# Patient Record
Sex: Female | Born: 1965 | Race: Black or African American | Hispanic: No | Marital: Single | Smoking: Never smoker
Health system: Southern US, Community
[De-identification: ages and names within clinical notes are randomized; demographics above are authoritative.]

## PROBLEM LIST (undated history)

## (undated) DIAGNOSIS — R51 Headache: Secondary | ICD-10-CM

## (undated) DIAGNOSIS — Q430 Meckel's diverticulum (displaced) (hypertrophic): Secondary | ICD-10-CM

## (undated) DIAGNOSIS — M1990 Unspecified osteoarthritis, unspecified site: Secondary | ICD-10-CM

## (undated) DIAGNOSIS — T7840XA Allergy, unspecified, initial encounter: Secondary | ICD-10-CM

## (undated) DIAGNOSIS — D649 Anemia, unspecified: Secondary | ICD-10-CM

## (undated) DIAGNOSIS — I10 Essential (primary) hypertension: Secondary | ICD-10-CM

## (undated) DIAGNOSIS — M533 Sacrococcygeal disorders, not elsewhere classified: Principal | ICD-10-CM

## (undated) DIAGNOSIS — N736 Female pelvic peritoneal adhesions (postinfective): Secondary | ICD-10-CM

## (undated) HISTORY — DX: Female pelvic peritoneal adhesions (postinfective): N73.6

## (undated) HISTORY — DX: Headache: R51

## (undated) HISTORY — DX: Meckel's diverticulum (displaced) (hypertrophic): Q43.0

## (undated) HISTORY — DX: Anemia, unspecified: D64.9

## (undated) HISTORY — DX: Allergy, unspecified, initial encounter: T78.40XA

## (undated) HISTORY — PX: OTHER SURGICAL HISTORY: SHX169

## (undated) HISTORY — DX: Essential (primary) hypertension: I10

## (undated) HISTORY — DX: Sacrococcygeal disorders, not elsewhere classified: M53.3

## (undated) HISTORY — DX: Unspecified osteoarthritis, unspecified site: M19.90

---

## 2004-08-10 DIAGNOSIS — Q430 Meckel's diverticulum (displaced) (hypertrophic): Secondary | ICD-10-CM

## 2004-08-10 HISTORY — DX: Meckel's diverticulum (displaced) (hypertrophic): Q43.0

## 2004-08-14 ENCOUNTER — Inpatient Hospital Stay (HOSPITAL_COMMUNITY): Admission: AD | Admit: 2004-08-14 | Discharge: 2004-08-14 | Payer: Self-pay | Admitting: Obstetrics & Gynecology

## 2004-08-25 ENCOUNTER — Inpatient Hospital Stay (HOSPITAL_COMMUNITY): Admission: AD | Admit: 2004-08-25 | Discharge: 2004-08-31 | Payer: Self-pay | Admitting: *Deleted

## 2004-08-26 ENCOUNTER — Encounter (INDEPENDENT_AMBULATORY_CARE_PROVIDER_SITE_OTHER): Payer: Self-pay | Admitting: Specialist

## 2005-02-04 ENCOUNTER — Ambulatory Visit (HOSPITAL_COMMUNITY): Admission: RE | Admit: 2005-02-04 | Discharge: 2005-02-04 | Payer: Self-pay | Admitting: *Deleted

## 2005-08-01 ENCOUNTER — Inpatient Hospital Stay (HOSPITAL_COMMUNITY): Admission: AD | Admit: 2005-08-01 | Discharge: 2005-08-01 | Payer: Self-pay | Admitting: *Deleted

## 2005-11-10 HISTORY — PX: ABDOMINAL HYSTERECTOMY: SHX81

## 2005-11-10 HISTORY — PX: LAPAROSCOPY: SHX197

## 2005-11-10 HISTORY — PX: LAPAROSCOPIC SALPINGOOPHERECTOMY: SUR795

## 2005-11-22 ENCOUNTER — Observation Stay (HOSPITAL_COMMUNITY): Admission: RE | Admit: 2005-11-22 | Discharge: 2005-11-23 | Payer: Self-pay | Admitting: Obstetrics & Gynecology

## 2005-11-22 ENCOUNTER — Encounter (INDEPENDENT_AMBULATORY_CARE_PROVIDER_SITE_OTHER): Payer: Self-pay | Admitting: Specialist

## 2007-07-25 ENCOUNTER — Ambulatory Visit (HOSPITAL_COMMUNITY): Admission: RE | Admit: 2007-07-25 | Discharge: 2007-07-25 | Payer: Self-pay | Admitting: Obstetrics & Gynecology

## 2007-12-06 LAB — CONVERTED CEMR LAB: Pap Smear: NORMAL

## 2008-02-07 ENCOUNTER — Encounter: Admission: RE | Admit: 2008-02-07 | Discharge: 2008-02-07 | Payer: Self-pay | Admitting: Neurology

## 2008-02-14 ENCOUNTER — Ambulatory Visit: Payer: Self-pay | Admitting: Internal Medicine

## 2008-02-14 DIAGNOSIS — I10 Essential (primary) hypertension: Secondary | ICD-10-CM | POA: Insufficient documentation

## 2008-02-14 DIAGNOSIS — E049 Nontoxic goiter, unspecified: Secondary | ICD-10-CM | POA: Insufficient documentation

## 2008-02-15 DIAGNOSIS — G43909 Migraine, unspecified, not intractable, without status migrainosus: Secondary | ICD-10-CM | POA: Insufficient documentation

## 2008-02-27 ENCOUNTER — Ambulatory Visit: Payer: Self-pay | Admitting: Radiology

## 2008-02-27 ENCOUNTER — Telehealth (INDEPENDENT_AMBULATORY_CARE_PROVIDER_SITE_OTHER): Payer: Self-pay | Admitting: *Deleted

## 2008-02-27 ENCOUNTER — Ambulatory Visit (HOSPITAL_BASED_OUTPATIENT_CLINIC_OR_DEPARTMENT_OTHER): Admission: RE | Admit: 2008-02-27 | Discharge: 2008-02-27 | Payer: Self-pay | Admitting: Internal Medicine

## 2008-03-26 ENCOUNTER — Encounter: Payer: Self-pay | Admitting: Internal Medicine

## 2008-04-02 ENCOUNTER — Ambulatory Visit: Payer: Self-pay | Admitting: Internal Medicine

## 2008-05-05 ENCOUNTER — Telehealth: Payer: Self-pay | Admitting: Internal Medicine

## 2008-05-05 ENCOUNTER — Ambulatory Visit: Payer: Self-pay | Admitting: Internal Medicine

## 2008-05-05 LAB — CONVERTED CEMR LAB
ALT: 11 units/L (ref 0–35)
AST: 15 units/L (ref 0–37)
Albumin: 3.7 g/dL (ref 3.5–5.2)
Alkaline Phosphatase: 65 units/L (ref 39–117)
BUN: 11 mg/dL (ref 6–23)
Basophils Absolute: 0 10*3/uL (ref 0.0–0.1)
Basophils Relative: 0.5 % (ref 0.0–3.0)
Bilirubin, Direct: 0.1 mg/dL (ref 0.0–0.3)
CO2: 28 meq/L (ref 19–32)
Calcium: 9.1 mg/dL (ref 8.4–10.5)
Chloride: 111 meq/L (ref 96–112)
Cholesterol: 172 mg/dL (ref 0–200)
Creatinine, Ser: 0.8 mg/dL (ref 0.4–1.2)
Eosinophils Absolute: 0.1 10*3/uL (ref 0.0–0.7)
Eosinophils Relative: 3.2 % (ref 0.0–5.0)
GFR calc non Af Amer: 100.46 mL/min (ref >60)
Glucose, Bld: 86 mg/dL (ref 70–99)
HCT: 39.1 % (ref 36.0–46.0)
HDL: 37.4 mg/dL — ABNORMAL LOW (ref >39.00)
Hemoglobin: 13.6 g/dL (ref 12.0–15.0)
LDL Cholesterol: 121 mg/dL — ABNORMAL HIGH (ref 0–99)
Lymphocytes Relative: 23.3 % (ref 12.0–46.0)
Lymphs Abs: 0.9 10*3/uL (ref 0.7–4.0)
MCHC: 34.7 g/dL (ref 30.0–36.0)
MCV: 86.6 fL (ref 78.0–100.0)
Monocytes Absolute: 0.5 10*3/uL (ref 0.1–1.0)
Monocytes Relative: 11.4 % (ref 3.0–12.0)
Neutro Abs: 2.5 10*3/uL (ref 1.4–7.7)
Neutrophils Relative %: 61.6 % (ref 43.0–77.0)
Platelets: 218 10*3/uL (ref 150.0–400.0)
Potassium: 4 meq/L (ref 3.5–5.1)
RBC: 4.52 M/uL (ref 3.87–5.11)
RDW: 12.2 % (ref 11.5–14.6)
Sodium: 142 meq/L (ref 135–145)
TSH: 1.14 microintl units/mL (ref 0.35–5.50)
Total Bilirubin: 0.9 mg/dL (ref 0.3–1.2)
Total CHOL/HDL Ratio: 5
Total Protein: 7 g/dL (ref 6.0–8.3)
Triglycerides: 66 mg/dL (ref 0.0–149.0)
VLDL: 13.2 mg/dL (ref 0.0–40.0)
WBC: 4 10*3/uL — ABNORMAL LOW (ref 4.5–10.5)

## 2008-05-07 ENCOUNTER — Ambulatory Visit: Payer: Self-pay | Admitting: Internal Medicine

## 2008-05-07 LAB — CONVERTED CEMR LAB
Cholesterol, target level: 200 mg/dL
HDL goal, serum: 40 mg/dL
LDL Goal: 130 mg/dL

## 2008-08-04 ENCOUNTER — Ambulatory Visit (HOSPITAL_COMMUNITY): Admission: RE | Admit: 2008-08-04 | Discharge: 2008-08-04 | Payer: Self-pay | Admitting: Internal Medicine

## 2008-08-04 LAB — HM MAMMOGRAPHY: HM Mammogram: NORMAL

## 2008-11-12 ENCOUNTER — Ambulatory Visit: Payer: Self-pay | Admitting: Internal Medicine

## 2008-12-11 ENCOUNTER — Ambulatory Visit: Payer: Self-pay | Admitting: Internal Medicine

## 2008-12-11 DIAGNOSIS — M5441 Lumbago with sciatica, right side: Secondary | ICD-10-CM

## 2008-12-11 DIAGNOSIS — L989 Disorder of the skin and subcutaneous tissue, unspecified: Secondary | ICD-10-CM | POA: Insufficient documentation

## 2008-12-11 DIAGNOSIS — M549 Dorsalgia, unspecified: Secondary | ICD-10-CM | POA: Insufficient documentation

## 2008-12-11 HISTORY — DX: Lumbago with sciatica, right side: M54.41

## 2008-12-11 LAB — CONVERTED CEMR LAB
Bilirubin Urine: NEGATIVE
Blood in Urine, dipstick: NEGATIVE
Glucose, Urine, Semiquant: NEGATIVE
Ketones, urine, test strip: NEGATIVE
Nitrite: NEGATIVE
Protein, U semiquant: NEGATIVE
Specific Gravity, Urine: >=1.03
Urobilinogen, UA: 0.2
WBC Urine, dipstick: NEGATIVE
pH: 6

## 2008-12-29 LAB — CONVERTED CEMR LAB: Pap Smear: NORMAL

## 2009-01-15 ENCOUNTER — Telehealth: Payer: Self-pay | Admitting: Internal Medicine

## 2009-01-16 ENCOUNTER — Ambulatory Visit: Payer: Self-pay | Admitting: Internal Medicine

## 2009-01-16 ENCOUNTER — Encounter: Admission: RE | Admit: 2009-01-16 | Discharge: 2009-01-16 | Payer: Self-pay | Admitting: Internal Medicine

## 2009-01-16 DIAGNOSIS — R112 Nausea with vomiting, unspecified: Secondary | ICD-10-CM | POA: Insufficient documentation

## 2009-01-16 LAB — CONVERTED CEMR LAB
Glucose, Urine, Semiquant: NEGATIVE
Nitrite: NEGATIVE
Protein, U semiquant: >=300
Specific Gravity, Urine: >=1.03
Urobilinogen, UA: 0.2
WBC Urine, dipstick: NEGATIVE
pH: 5

## 2009-01-17 ENCOUNTER — Encounter: Payer: Self-pay | Admitting: Internal Medicine

## 2009-01-19 ENCOUNTER — Telehealth: Payer: Self-pay | Admitting: Internal Medicine

## 2009-01-21 ENCOUNTER — Ambulatory Visit (HOSPITAL_BASED_OUTPATIENT_CLINIC_OR_DEPARTMENT_OTHER): Admission: RE | Admit: 2009-01-21 | Discharge: 2009-01-21 | Payer: Self-pay | Admitting: Internal Medicine

## 2009-01-21 ENCOUNTER — Ambulatory Visit: Payer: Self-pay | Admitting: Radiology

## 2009-01-21 ENCOUNTER — Encounter: Payer: Self-pay | Admitting: Internal Medicine

## 2009-01-22 ENCOUNTER — Telehealth: Payer: Self-pay | Admitting: Internal Medicine

## 2009-01-28 ENCOUNTER — Encounter: Payer: Self-pay | Admitting: Internal Medicine

## 2009-02-04 ENCOUNTER — Ambulatory Visit: Payer: Self-pay | Admitting: Internal Medicine

## 2009-02-04 DIAGNOSIS — M5430 Sciatica, unspecified side: Secondary | ICD-10-CM | POA: Insufficient documentation

## 2009-02-12 ENCOUNTER — Emergency Department (HOSPITAL_COMMUNITY): Admission: EM | Admit: 2009-02-12 | Discharge: 2009-02-12 | Payer: Self-pay | Admitting: Emergency Medicine

## 2009-02-12 ENCOUNTER — Encounter: Payer: Self-pay | Admitting: Internal Medicine

## 2009-02-12 ENCOUNTER — Telehealth: Payer: Self-pay | Admitting: Internal Medicine

## 2009-02-17 ENCOUNTER — Encounter: Payer: Self-pay | Admitting: Internal Medicine

## 2009-02-18 ENCOUNTER — Encounter: Admission: RE | Admit: 2009-02-18 | Discharge: 2009-04-08 | Payer: Self-pay | Admitting: Internal Medicine

## 2009-02-19 ENCOUNTER — Ambulatory Visit: Payer: Self-pay | Admitting: Internal Medicine

## 2009-02-20 ENCOUNTER — Encounter: Payer: Self-pay | Admitting: Internal Medicine

## 2009-03-02 ENCOUNTER — Encounter: Payer: Self-pay | Admitting: Internal Medicine

## 2009-03-03 ENCOUNTER — Encounter: Payer: Self-pay | Admitting: Internal Medicine

## 2009-08-05 ENCOUNTER — Ambulatory Visit (HOSPITAL_COMMUNITY): Admission: RE | Admit: 2009-08-05 | Discharge: 2009-08-05 | Payer: Self-pay | Admitting: Obstetrics & Gynecology

## 2010-02-09 NOTE — Assessment & Plan Note (Signed)
Summary: Back & Leg Pain/ mhf   Vital Signs:  Patient profile:   45 year old female Weight:      163 pounds BMI:     28.98 O2 Sat:      100 % on Room air Temp:     97.9 degrees F oral Pulse rate:   88 / minute Pulse rhythm:   regular Resp:     18 per minute BP sitting:   120 / 90  Vitals Entered By: Glendell Docker CMA (January 16, 2009 9:14 AM)  O2 Flow:  Room air  Primary Care Provider:  D. Thomos Lemons DO  CC:  Back and Leg Pain.  History of Present Illness: 45y/o AA female c/o recurrent right lower back pain back got better after prev flare pain describes as shart shooting pain radiating down right leg pain assoc with tingling in her leg  she has had vomiting x 2  yesterday, she was able to tolerate soup yesterday.  no fever  no severe abd pain or flank pain  Allergies (verified): No Known Drug Allergies  Past History:  Past Medical History: Hypertension Headache (Chronic migraines - Dr. Vela Prose)   Meckel's diverticulum with ischemia. 08/2004      Past Surgical History: Menorrhagia and dysmenorrhea post endometrial ablation and possible right ovarian lesion.  11/2005  2. 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   3. Adhesions of omentum to anterior wall. Total laparoscopy, assisted hysterectomy, right salpingo- oophorectomy--assisted with daVinci robot, and lysis of omental  adhesions. 11/2005   Meckel's diverticulectomy, small bowel resection and resection of retrocecal appendix.       Family History: Family History Hypertension Colon ca - no Prostate ca - no  Breast ca - no          Social History: Widow/Widower  2007-2008 3 children (1 college, 2 HS) Occupation: Agricultural engineer  Never Smoked    Alcohol use-yes (social)     Physical Exam  General:  alert, well-developed, and well-nourished.   Mouth:  pharynx pink and moist.   Neck:  supple.  non tender Lungs:  normal respiratory effort and normal breath sounds.   Heart:   normal rate, regular rhythm, and no gallop.   Abdomen:  mild diffuse tenderness.  soft, no guarding, no rigidity, and no rebound tenderness.   Extremities:  No lower extremity edema  Neurologic:  cranial nerves II-XII intact, mild right hip flexor weakness Psych:  normally interactive and good eye contact.     Impression & Recommendations:  Problem # 1:  BACK PAIN (ICD-724.5)  Back pain improved slightly after last visit then got much worse 4 days ago.  she has shooting pain down right leg.  mild weakness of right hip flexor.    she also vomited yesterday and reports low grade fever.  I doubt infectious etiology of low back pain.  she likely has stomach flu.   obtain MRI of LS spine   Her updated medication list for this problem includes:    Chlorzoxazone 500 Mg Tabs (Chlorzoxazone) ..... One tablet as needed    Oxycodone-acetaminophen 5-325 Mg Tabs (Oxycodone-acetaminophen) ..... One tablet as needed pain    Ketoprofen 75 Mg Caps (Ketoprofen) ..... One tablet as needed for headaches    Metaxalone 800 Mg Tabs (Metaxalone) ..... One by mouth three times a day  Orders: Radiology Referral (Radiology) Specimen Handling ([address]109) T-Culture, Urine (60454-09811) UA Dipstick w/o Micro (manual) (81002)  Problem # 2:  NAUSEA WITH  VOMITING (ICD-787.01) Probable viral gastroenteritis.  check labs  Problem # 3:  HYPERTENSION (ICD-401.9) hold lisinopril considering nausea and vomiting.  increase fluid intake.  resume when normal p.o. intake.  Her updated medication list for this problem includes:    Amlodipine Besylate 5 Mg Tabs (Amlodipine besylate) ..... One by mouth once daily    Lisinopril 20 Mg Tabs (Lisinopril) ..... One by mouth qd  BP today: 120/90 Prior BP: 130/100 (12/11/2008)  Prior 10 Yr Risk Heart Disease: 3 % (05/07/2008)  Labs Reviewed: K+: 4.0 (05/05/2008) Creat: : 0.8 (05/05/2008)   Chol: 172 (05/05/2008)   HDL: 37.40 (05/05/2008)   LDL: 121 (05/05/2008)   TG: 66.0  (05/05/2008)  Complete Medication List: 1)  Relpax 40 Mg Tabs (Eletriptan hydrobromide) .... One tablet as needed 2)  Chlorzoxazone 500 Mg Tabs (Chlorzoxazone) .... One tablet as needed 3)  Oxycodone-acetaminophen 5-325 Mg Tabs (Oxycodone-acetaminophen) .... One tablet as needed pain 4)  Ketoprofen 75 Mg Caps (Ketoprofen) .... One tablet as needed for headaches 5)  Topamax 50 Mg Tabs (Topiramate) .... As directed by headache specialist 6)  Amlodipine Besylate 5 Mg Tabs (Amlodipine besylate) .... One by mouth once daily 7)  Lisinopril 20 Mg Tabs (Lisinopril) .... One by mouth qd 8)  Metaxalone 800 Mg Tabs (Metaxalone) .... One by mouth three times a day 9)  Ondansetron Hcl 4 Mg Tabs (Ondansetron hcl) .... One by mouth three times a day as needed nausea 10)  Ciprofloxacin Hcl 500 Mg Tabs (Ciprofloxacin hcl) .... One by mouth two times a day  Patient Instructions: 1)  Please schedule a follow-up appointment in 2 weeks. 2)  Call our office if your symptoms do not  improve or gets worse. Prescriptions: OXYCODONE-ACETAMINOPHEN 5-325 MG TABS (OXYCODONE-ACETAMINOPHEN) one tablet as needed pain  #15 x 0   Entered and Authorized by:   D. Thomos Lemons DO   Signed by:   D. Thomos Lemons DO on 01/16/2009   Method used:   Print then Give to Patient   RxID:   [id number] ONDANSETRON HCL 4 MG TABS (ONDANSETRON HCL) one by mouth three times a day as needed nausea  #21 x 0   Entered and Authorized by:   D. Thomos Lemons DO   Signed by:   D. Thomos Lemons DO on 01/16/2009   Method used:   Electronically to        [id number] (retail)       [address] [place], [place]  [address]       Ph: [phone]       Fax: [phone]   RxID:   [id number]   Current Allergies (reviewed today): No known allergies   Laboratory Results   Urine Tests    Routine Urinalysis   Color: straw Appearance: Hazy Glucose: negative   (Normal Range:  Negative) Bilirubin: large   (Normal Range: Negative) Ketone: >= (160)   (Normal Range: Negative) Spec. Gravity: >=1.030   (Normal Range: 1.003-1.035) Blood: trace-intact   (Normal Range: Negative) pH: 5.0   (Normal Range: 5.0-8.0) Protein: >=300   (Normal Range: Negative) Urobilinogen: 0.2   (Normal Range: 0-1) Nitrite: negative   (Normal Range: Negative) Leukocyte Esterace: negative   (Normal Range: Negative)

## 2010-02-09 NOTE — Assessment & Plan Note (Signed)
Summary: 2 WK F/U/HEA   Vital Signs:  Patient profile:   45 year old female Height:      63 inches Weight:      163.25 pounds BMI:     29.02 O2 Sat:      100 % on Room air Temp:     98.3 degrees F oral Pulse rate:   100 / minute BP sitting:   132 / 90  (right arm)  Vitals Entered By: Lucious Groves (February 19, 2009 3:12 PM)  O2 Flow:  Room air CC: F/U--Pt states that she had gotten worse but is now doing a little better. Is Patient Diabetic? No Pain Assessment Patient in pain? yes      Intensity: 8 Type: sharp Comments Patient did have an episode of increased BP on 02-12-2009./kb   Primary Care Provider:  Dondra Spry DO  CC:  F/U--Pt states that she had gotten worse but is now doing a little better.[name]  History of Present Illness: 45 y/o AA female for f/u re:  right sciatica symptoms pt seen by Dr. Cleophas Dunker. he reviewed her MRI he agrees with PT  Htn - stable  Current Medications (verified): 1)  Relpax 40 Mg Tabs (Eletriptan Hydrobromide) .... One Tablet As Needed 2)  Chlorzoxazone 500 Mg Tabs (Chlorzoxazone) .... One Tablet As Needed 3)  Oxycodone-Acetaminophen 5-325 Mg Tabs (Oxycodone-Acetaminophen) .... One Tablet As Needed Pain 4)  Ketoprofen 75 Mg Caps (Ketoprofen) .... One Tablet As Needed For Headaches 5)  Topamax 50 Mg Tabs (Topiramate) .... As Directed By Headache Specialist 6)  Amlodipine Besylate 5 Mg Tabs (Amlodipine Besylate) .... One By Mouth Once Daily 7)  Lisinopril 20 Mg Tabs (Lisinopril) .... One By Mouth Qd 8)  Metaxalone 800 Mg Tabs (Metaxalone) .... One By Mouth Three Times A Day 9)  Ondansetron Hcl 4 Mg Tabs (Ondansetron Hcl) .... One By Mouth Three Times A Day As Needed Nausea  Allergies (verified): No Known Drug Allergies  Past History:  Past Medical History: Hypertension Headache (Chronic migraines - Dr. Vela Prose)    Meckel's diverticulum with ischemia. 08/2004      Past Surgical History: Menorrhagia and dysmenorrhea post endometrial  ablation and possible right ovarian lesion.  11/2005  2. 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   3. Adhesions of omentum to anterior wall. Total laparoscopy, assisted hysterectomy, right salpingo- oophorectomy--assisted with daVinci robot, and lysis of omental  adhesions. 11/2005    Meckel's diverticulectomy, small bowel resection and resection of retrocecal appendix.        Physical Exam  General:  alert, well-developed, and well-nourished.   Head:  normocephalic and atraumatic.   Neck:  supple.  non tender Lungs:  normal respiratory effort and normal breath sounds.   Heart:  normal rate, regular rhythm, and no gallop.   Extremities:  No lower extremity edema    Impression & Recommendations:  Problem # 1:  SCIATICA, RIGHT (ICD-724.3) pt seen by Dr. Cleophas Dunker.  he agrees with w/u so far.  Pt working with physical therapist.  Dr. Cleophas Dunker suspects duties at work aggravating back pain.  Her updated medication list for this problem includes:    Chlorzoxazone 500 Mg Tabs (Chlorzoxazone) ..... One tablet as needed    Oxycodone-acetaminophen 5-325 Mg Tabs (Oxycodone-acetaminophen) ..... One tablet as needed pain    Ketoprofen 75 Mg Caps (Ketoprofen) ..... One tablet as needed for headaches    Metaxalone 800 Mg Tabs (Metaxalone) ..... One by mouth three times a day  Problem # 2:  HYPERTENSION (ICD-401.9) Assessment: Unchanged stable.  Maintain current medication regimen.  Her updated medication list for this problem includes:    Amlodipine Besylate 5 Mg Tabs (Amlodipine besylate) ..... One by mouth once daily    Lisinopril 20 Mg Tabs (Lisinopril) ..... One by mouth qd  BP today: 132/90 Prior BP: 124/90 (02/04/2009)  Prior 10 Yr Risk Heart Disease: 3 % (05/07/2008)  Labs Reviewed: K+: 4.0 (05/05/2008) Creat: : 0.8 (05/05/2008)   Chol: 172 (05/05/2008)   HDL: 37.40 (05/05/2008)   LDL: 121 (05/05/2008)   TG: 66.0 (05/05/2008)  Complete Medication  List: 1)  Relpax 40 Mg Tabs (Eletriptan hydrobromide) .... One tablet as needed 2)  Chlorzoxazone 500 Mg Tabs (Chlorzoxazone) .... One tablet as needed 3)  Oxycodone-acetaminophen 5-325 Mg Tabs (Oxycodone-acetaminophen) .... One tablet as needed pain 4)  Ketoprofen 75 Mg Caps (Ketoprofen) .... One tablet as needed for headaches 5)  Topamax 50 Mg Tabs (Topiramate) .... As directed by headache specialist 6)  Amlodipine Besylate 5 Mg Tabs (Amlodipine besylate) .... One by mouth once daily 7)  Lisinopril 20 Mg Tabs (Lisinopril) .... One by mouth qd 8)  Metaxalone 800 Mg Tabs (Metaxalone) .... One by mouth three times a day 9)  Ondansetron Hcl 4 Mg Tabs (Ondansetron hcl) .... One by mouth three times a day as needed nausea

## 2010-02-09 NOTE — Progress Notes (Signed)
Summary: Status Update  Phone Note Outgoing Call   Summary of Call: call pt - MRI of lumbar spine - negative.  Is her back pain better? Initial call taken by: D. Thomos Lemons DO,  January 19, 2009 12:08 PM  Follow-up for Phone Call        patient advised per Dr  Artist Pais instructions. She states she is stil having the pain on the one side and would like to know if she could be placed on lifting restrictions. If approved she would like note faxed to [phone] Shelda Jakes Follow-up by: Glendell Docker CMA,  January 19, 2009 4:25 PM  Additional Follow-up for Phone Call Additional follow up Details #1::        call pt - urine culture showed enterococcus.   I suggest treatment with cipro - see rx she also needs renal u/s re:  right sided back back pain and UTI Additional Follow-up by: D. Thomos Lemons DO,  January 21, 2009 10:41 AM    Additional Follow-up for Phone Call Additional follow up Details #2::    patient advised per Dr Artist Pais instructions. She states she will be in today to have the ultrasound done and will pick up the note then. Follow-up by: Glendell Docker CMA,  January 21, 2009 11:25 AM  New/Updated Medications: CIPROFLOXACIN HCL 500 MG TABS (CIPROFLOXACIN HCL) one by mouth two times a day Prescriptions: CIPROFLOXACIN HCL 500 MG TABS (CIPROFLOXACIN HCL) one by mouth two times a day  #14 x 0   Entered and Authorized by:   D. Thomos Lemons DO   Signed by:   D. Thomos Lemons DO on 01/21/2009   Method used:   Electronically to        [id number] (retail)       [address] [place], [place]  [address]       Ph: [phone]       Fax: [phone]   RxID:   [id number]

## 2010-02-09 NOTE — Assessment & Plan Note (Signed)
Summary: BACK AND RIGHT SIDE ABD PAIN/[facility]   Vital Signs:  Patient profile:   45 year old female Weight:      163.25 pounds BMI:     29.02 O2 Sat:      100 % on Room air Temp:     98.0 degrees F oral Pulse rate:   78 / minute Pulse rhythm:   regular Resp:     16 per minute BP sitting:   130 / 100  (left arm) Cuff size:   large  Vitals Entered By: Glendell Docker CMA (December 11, 2008 4:04 PM)  O2 Flow:  Room air  Primary Care Provider:  D. Thomos Lemons DO  CC:  Migraine , Back & Right Side Pain, and Back pain.  History of Present Illness:  Back Pain      This is a 45 year old woman who presents with Back pain.  The patient denies fever, chills, urinary incontinence, urinary retention, and dysuria.  The pain is located in the right low back.  The pain began at work, suddenly, after lifting, and after straining.  The pain is made worse by lying down and activity. no radicular symptoms  Migraine Headache - severe right sided migraine.  + nausea.   did not completely taper off topamax  Htn - stable.  Allergies (verified): No Known Drug Allergies  Past History:  Past Medical History: Hypertension Headache (Chronic migraines - Dr. Vela Prose)   Meckel's diverticulum with ischemia. 08/2004     Past Surgical History: Menorrhagia and dysmenorrhea post endometrial ablation and possible right ovarian lesion.  11/2005  2. 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   3. Adhesions of omentum to anterior wall. Total laparoscopy, assisted hysterectomy, right salpingo- oophorectomy--assisted with daVinci robot, and lysis of omental  adhesions. 11/2005   Meckel's diverticulectomy, small bowel resection and resection of retrocecal appendix.      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  The patient denies fever.    Physical Exam  General:  alert, well-developed, and well-nourished.   Head:  normocephalic and atraumatic.   Eyes:  pupils equal, pupils round, and pupils reactive to light.   Lungs:  normal respiratory effort and normal breath sounds.   Heart:  normal rate, regular rhythm, and no gallop.   Neurologic:  cranial nerves II-XII intact, gait normal, and DTRs symmetrical and normal.   Psych:  normally interactive and good eye contact.     Detailed Back/Spine Exam  Lumbosacral Exam:  Inspection-deformity:    Normal Palpation-spinal tenderness:  Abnormal    right lumbar paraspinal muscles tender Lying Straight Leg Raise:    Right:  negative    Left:  negative Toe Walking:    Right:  normal    Left:  normal Heel Walking:    Right:  normal    Left:  normal   Impression & Recommendations:  Problem # 1:  HYPERTENSION (ICD-401.9) Assessment Deteriorated exacerbated by severe headache.  keep current dose. monitor BP at home.  pt advised to call office if SBP > 140-150's Her updated medication list for this problem includes:  Amlodipine Besylate 5 Mg Tabs (Amlodipine besylate) ..... One by mouth once daily    Lisinopril 20 Mg Tabs (Lisinopril) ..... 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  Refer to Derm to see if we need to biopsy.  Orders: Dermatology Referral (Derma)  Problem # 3:  HEADACHE (ICD-784.0)  Pt with severe right sided migraine.  phenergen 25 mg IM.  use muscle relaxer and restart topamax.  Her updated  medication list for this problem includes:    Relpax 40 Mg Tabs (Eletriptan hydrobromide) ..... One tablet as needed    Oxycodone-acetaminophen 5-325 Mg Tabs (Oxycodone-acetaminophen) ..... One tablet as needed pain    Ketoprofen 75 Mg Caps (Ketoprofen) ..... One tablet as needed for headaches  Orders: Promethazine up to 50mg  (J2550) Admin of Therapeutic Inj  intramuscular or subcutaneous (57846)  Problem # 4:  BACK PAIN (ICD-724.5) Pt with right sided back pain - lumbar strain.  no sign of radiculopathy.  use metaxalone and pain med as needed.  pt advised not to use chlorzoxazone at same time.  Pt to f/u with local chiropractor.  Her updated medication list for this problem includes:    Chlorzoxazone 500 Mg Tabs (Chlorzoxazone) ..... One tablet as needed    Oxycodone-acetaminophen 5-325 Mg Tabs (Oxycodone-acetaminophen) ..... One tablet as needed pain    Ketoprofen 75 Mg Caps (Ketoprofen) ..... One tablet as needed for headaches    Metaxalone 800 Mg Tabs (Metaxalone) ..... One by mouth three times a day  Orders: UA Dipstick w/o Micro (manual) (96295)  Complete Medication List: 1)  Relpax 40 Mg Tabs (Eletriptan hydrobromide) .... One tablet as needed 2)  Chlorzoxazone 500 Mg Tabs (Chlorzoxazone) .... One tablet as needed 3)  Oxycodone-acetaminophen 5-325 Mg Tabs (Oxycodone-acetaminophen) .... One tablet as needed pain 4)  Ketoprofen 75 Mg Caps (Ketoprofen) .... One tablet as needed for headaches 5)  Topamax 50 Mg Tabs (Topiramate) .... As directed by headache specialist 6)  Amlodipine Besylate 5 Mg Tabs (Amlodipine besylate) .... One by mouth once daily 7)  Lisinopril 20 Mg Tabs (Lisinopril) .... One by mouth qd 8)  Metaxalone 800 Mg Tabs (Metaxalone) .... One by mouth three times a day  Patient Instructions: 1)  Please schedule a follow-up appointment in 2 months. 2)  BMP prior to visit, ICD-9:  401.9 3)  Please return for lab work one (1) week before your next appointment.  4)   [facility] [phone] 5)  Call our office if your symptoms do not  improve or gets worse. Prescriptions: METAXALONE 800 MG TABS (METAXALONE) one by mouth three times a day  #30 x 0   Entered and Authorized by:   D. Thomos Lemons DO   Signed by:   D. Thomos Lemons DO on 12/11/2008   Method used:   Electronically to        [id number] (retail)       [address] [place], [place]  [address]       Ph: [phone]  Fax: [phone]   RxID:   [id number]   Current Allergies (reviewed today): No known allergies   Laboratory Results   Urine Tests    Routine Urinalysis   Color: yellow Appearance: Clear Glucose: negative   (Normal Range: Negative) Bilirubin: negative   (Normal Range: Negative) Ketone: negative   (Normal Range: Negative) Spec. Gravity: >=1.030   (Normal Range: 1.003-1.035) Blood: negative   (Normal Range: Negative) pH: 6.0   (Normal Range: 5.0-8.0) Protein: negative   (Normal Range: Negative) Urobilinogen: 0.2   (Normal Range: 0-1) Nitrite: negative   (Normal Range: Negative) Leukocyte Esterace: negative   (Normal Range: Negative)         Medication Administration  Injection # 1:    Medication: Promethazine up to 50mg     Diagnosis: HEADACHE (ICD-784.0)    Route: IM    Site: RUOQ gluteus    Exp Date: 01/09/2009    Lot #: [id number]    Mfr: NOVAPLUS    Comments: 25MG  injected IM    Patient tolerated injection without complications    Given by: Glendell Docker CMA (December 11, 2008 4:53 PM)  Orders Added: 1)  UA Dipstick w/o Micro (manual) [81002] 2)  Dermatology Referral [Derma] 3)  Promethazine up to 50mg  [J2550] 4)  Admin of Therapeutic Inj  intramuscular or subcutaneous [96372] 5)  Est. Patient Level IV [43329]

## 2010-02-09 NOTE — Progress Notes (Signed)
Summary: Phone note  Phone Note Outgoing Call   Summary of Call: call pt - thyroid u/s normal Initial call taken by: D. Thomos Lemons DO,  February 27, 2008 1:16 PM  Follow-up for Phone Call        Left message on patient's voicemail to return call. Follow-up by: Darra Lis RMA,  February 27, 2008 1:53 PM  Additional Follow-up for Phone Call Additional follow up Details #1::        Patient informed per Dr.Yoo's instructions. Additional Follow-up by: Darra Lis RMA,  February 27, 2008 2:03 PM

## 2010-02-09 NOTE — Consult Note (Signed)
Summary: [facility]  [facility]   Imported By: Lanelle Bal 03/02/2009 11:14:49  _____________________________________________________________________  External Attachment:    Type:   Image     Comment:   External Document

## 2010-02-09 NOTE — Letter (Signed)
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 [address]   [place], [place] [address]   Phone: [phone]  Fax: [phone]      February 04, 2009    Employee:  [name]     To Whom It May Concern:   For Medical reasons, the above named employee is restricted to lifting no more than 15 (fifteen) pounds for the following dates:  Start:   02/04/2009  End:   02/18/2009  If you need additional information, please feel free to contact our office.           Sincerely,    Glendell Docker CMA Dr Thomos Lemons

## 2010-02-09 NOTE — Progress Notes (Signed)
  Phone Note Outgoing Call   Summary of Call: call pt - renal u/s is normal Initial call taken by: D. Thomos Lemons DO,  January 22, 2009 10:33 AM  Follow-up for Phone Call        informed pt. that renal u/s is normal.  Follow-up by: Michaelle Copas,  January 22, 2009 2:11 PM

## 2010-02-09 NOTE — Miscellaneous (Signed)
Summary: PT Initial Summary/[facility]  PT Initial Summary/[facility]   Imported By: Lanelle Bal 03/19/2009 11:38:29  _____________________________________________________________________  External Attachment:    Type:   Image     Comment:   External Document

## 2010-02-09 NOTE — Assessment & Plan Note (Signed)
Summary: NEW PT TO BE EST. BP BEEN HIGH-CH    PCP:  Dondra Spry DO  Chief Complaint:  Establish care with a new doctor.[name]  History of Present Illness: 45 year old African-American female with past medical history of hypertension and chronic migraine headaches to establish.  Patient diagnosed with hypertension one year ago.  She has been treated with Hctz with suboptimal control.  Patient works as a Conservation officer, nature for [employer] and reports urinary frequency is problematic.  She  and denies history of coronary artery disease.  She denies chest pain or shortness of breath.    Current Allergies (reviewed today): No known allergies   Past Medical History:    Hypertension    Headache (Chronic migraines - Dr. 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  11/2005     2. 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     3. Adhesions of omentum to anterior wall.    Total laparoscopy, assisted hysterectomy, right salpingo- oophorectomy--assisted with daVinci robot, and lysis of omental  adhesions. 11/2005        Meckel's diverticulectomy, small bowel resection and resection of retrocecal appendix.   Family History:    Family History Hypertension    Colon ca - no    Prostate ca - no     Breast ca - no  Social History:    Widow/Widower  2007-2008    3 children (1 college, 2 HS)    Occupation: Agricultural engineer     Never Smoked    Alcohol use-yes (social)   Risk Factors:  Tobacco use:  never Passive smoke exposure:  no Drug use:  no HIV high-risk behavior:  no Caffeine use:  0 drinks per day Alcohol use:  yes    Type:  wine, cooler    Has patient --       Felt need to cut down:  no       Been annoyed by complaints:  no       Felt guilty about drinking:  no       Needed eye opener in the morning:  no    Comments:  social once a year  Counseled to quit/cut down alcohol use:  no Exercise:  yes    Times per week:  4    Type:  walking, cardio Seatbelt use:  100 % Sun Exposure:  occasionally  Family History Risk Factors:    Family History of MI in females < 22 years old:  no    Family History of MI in males < 62 years old:  no  PAP Smear History:     Date of Last PAP Smear:  12/06/2007    Results:  Normal   Mammogram History:     Date of Last Mammogram:  10/10/2007    Results:  Normal Bilateral    Review of Systems       The patient complains of weight gain and headaches.  The patient denies anorexia, fever, chest pain, dyspnea on exertion, peripheral edema, prolonged cough, melena, hematochezia, severe indigestion/heartburn, and depression.         No diarrhea, constipation.   All other systems negative.    Physical Exam  General:     alert, well-developed, and well-nourished.   Head:     normocephalic and atraumatic.   Eyes:     vision grossly intact, pupils equal,  pupils round, and pupils reactive to light.   Ears:     R ear normal and L ear normal.   Mouth:     Oral mucosa and oropharynx without lesions or exudates.   Neck:     supple, no carotid bruits, and thyromegaly.   Lungs:     normal respiratory effort and normal breath sounds.   Heart:     normal rate, regular rhythm, and no gallop.   Abdomen:     soft, non-tender, no hepatomegaly, and no splenomegaly.   Extremities:     No clubbing, cyanosis, edema Neurologic:     cranial nerves II-XII intact and gait normal.   Skin:     warm, dry Psych:     normally interactive, good eye contact, not anxious appearing, and not depressed appearing.      Impression & Recommendations:  Problem # 1:  HYPERTENSION (ICD-39.53) 45 year old African-American female with stage II hypertension.  Patient previously failed hydrochlorothiazide.  Patient works as Conservation officer, nature and urinary frequency is problematic with diuretic.  Start combination ACE inhibitor  and calcium channel blocker.  We discussed common side effects of ACE inhibitor.  Arrange follow-up labs.  Patient advised to avoid NSAIDs.  Her updated medication list for this problem includes:    Amlodipine Besylate 5 Mg Tabs (Amlodipine besylate) ..... One by mouth once daily    Lisinopril 10 Mg Tabs (Lisinopril) ..... One by mouth once daily  BP today: 160/102   Problem # 2:  GOITER (ICD-240.9) Arrange thyroid function tests and thyroid ultrasound. Orders: Radiology Referral (Radiology)   Complete Medication List: 1)  Relpax 40 Mg Tabs (Eletriptan hydrobromide) .... One tablet as needed 2)  Chlorzoxazone 500 Mg Tabs (Chlorzoxazone) .... One tablet as needed 3)  Oxycodone-acetaminophen 5-325 Mg Tabs (Oxycodone-acetaminophen) .... One tablet as needed pain 4)  Ketoprofen 75 Mg Caps (Ketoprofen) .... One tablet as needed for headaches 5)  Topamax 100 Mg Tabs (Topiramate) .... Take 1 tablet by mouth once a day 6)  Amlodipine Besylate 5 Mg Tabs (Amlodipine besylate) .... One by mouth once daily 7)  Lisinopril 10 Mg Tabs (Lisinopril) .... One by mouth once daily   Patient Instructions: 1)  Please schedule a follow-up appointment in 1 month. 2)  BMP prior to visit, ICD-9:  401.9 3)  Hepatic Panel prior to visit, ICD-9:  401.9 4)  Lipid Panel prior to visit, ICD-9: 401.9 5)  CRP:  401.9 6)  TSH prior to visit, ICD-9: 401.9 7)  Free T4 - 240.9 8)  CBC w/ Diff prior to visit, ICD-9:401.9 9)  Please return for lab work one (1) week before your next appointment.    Prescriptions: LISINOPRIL 10 MG TABS (LISINOPRIL) one by mouth once daily  #30 x 3   Entered and Authorized by:   D. Thomos Lemons DO   Signed by:   D. Thomos Lemons DO on 02/14/2008   Method used:   Electronically to        [id number] (retail)       [address] [place], [place]  [address]       Ph: [phone]       Fax: [phone]   RxID:   [id number] AMLODIPINE  BESYLATE 5 MG TABS (AMLODIPINE BESYLATE) one by mouth once daily  #30 x 3   Entered and Authorized by:   D. Thomos Lemons DO   Signed  by:   Dondra Spry DO on 02/14/2008   Method used:   Electronically to        [id number] (retail)       [address] [place], [place]  [address]       Ph: [phone]       Fax: [phone]   RxID:   [id number]

## 2010-02-09 NOTE — Assessment & Plan Note (Signed)
Summary: 1 mon f/u/hea   Vital Signs:  Patient profile:   45 year old female Weight:      160.75 pounds BMI:     28.58 Temp:     98.2 degrees F oral Pulse rate:   64 / minute Pulse rhythm:   regular Resp:     16 per minute BP sitting:   120 / 80  (left arm) Cuff size:   large  Vitals Entered By: Glendell Docker CMA (May 07, 2008 10:21 AM)  Primary Care Provider:  Dondra Spry DO  CC:  Follow up on Blood Work.  History of Present Illness:  Hypertension Follow-Up      This is a 45 year old woman who presents for Hypertension follow-up.  The patient denies lightheadedness, urinary frequency, headaches, and edema.  The patient denies the following associated symptoms: chest pain.  Compliance with medications (by patient report) has been near 100%.  The patient reports that dietary compliance has been fair.    Headches - less frequent.  "much better"  Lipid Management History:      Positive NCEP/ATP III risk factors include HDL cholesterol less than 40 and hypertension.  Negative NCEP/ATP III risk factors include female age less than 31 years old, no history of early menopause without estrogen hormone replacement, non-diabetic, no family history for ischemic heart disease, non-tobacco-user status, no ASHD (atherosclerotic heart disease), no prior stroke/TIA, no peripheral vascular disease, and no history of aortic aneurysm.     Allergies (verified): No Known Drug Allergies  Past History:  Past Medical History:    Hypertension    Headache (Chronic migraines - Dr. 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  11/2005     2. 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      3. Adhesions of omentum to anterior wall.    Total laparoscopy, assisted hysterectomy, right salpingo- oophorectomy--assisted with daVinci robot, and lysis of  omental  adhesions. 11/2005        Meckel's diverticulectomy, small bowel resection and resection of retrocecal appendix.    Family History:    Family History Hypertension    Colon ca - no    Prostate ca - no     Breast ca - no        Social History:    Widow/Widower  2007-2008    3 children (1 college, 2 HS)    Occupation: Agricultural engineer     Never Smoked     Alcohol use-yes (social)   Physical Exam  General:  alert, well-developed, and well-nourished.   Neck:  supple, no carotid bruits, and thyromegaly.   Lungs:  normal respiratory effort and normal breath sounds.   Heart:  normal rate, regular rhythm, and no gallop.   Abdomen:  soft and non-tender.   Extremities:  No lower extremity edema    Impression & Recommendations:  Problem # 1:  HYPERTENSION (ICD-401.9) Excellent control  Maintain current medication regimen.  Her updated medication list for this problem includes:    Amlodipine Besylate 5 Mg Tabs (Amlodipine besylate) ..... One by mouth once daily    Lisinopril 20 Mg Tabs (Lisinopril) ..... One by mouth qd  BP today: 120/80 Prior BP: 140/90 (04/02/2008)  10 Yr Risk Heart Disease: 3 %  Labs Reviewed: K+: 4.0 (05/05/2008) Creat: : 0.8 (05/05/2008)   Chol: 172 (05/05/2008)  HDL: 37.40 (05/05/2008)   LDL: 121 (05/05/2008)   TG: 66.0 (05/05/2008)  Problem # 2:  HEADACHE (ICD-784.0) Assessment: Improved Pt reports decreased freq and severity.   I suspect ACE is helping.    Her updated medication list for this problem includes:    Relpax 40 Mg Tabs (Eletriptan hydrobromide) ..... One tablet as needed    Oxycodone-acetaminophen 5-325 Mg Tabs (Oxycodone-acetaminophen) ..... One tablet as needed pain    Ketoprofen 75 Mg Caps (Ketoprofen) ..... One tablet as needed for headaches  Complete Medication List: 1)  Relpax 40 Mg Tabs (Eletriptan hydrobromide) .... One tablet as needed 2)  Chlorzoxazone 500 Mg Tabs (Chlorzoxazone) .... One tablet as needed 3)   Oxycodone-acetaminophen 5-325 Mg Tabs (Oxycodone-acetaminophen) .... One tablet as needed pain 4)  Ketoprofen 75 Mg Caps (Ketoprofen) .... One tablet as needed for headaches 5)  Topamax 100 Mg Tabs (Topiramate) .... Take 1 tablet by mouth once a day 6)  Amlodipine Besylate 5 Mg Tabs (Amlodipine besylate) .... One by mouth once daily 7)  Lisinopril 20 Mg Tabs (Lisinopril) .... One by mouth qd  Other Orders: Tdap => 11yrs IM ([address]) Admin 1st Vaccine ([address])  Lipid Assessment/Plan:      Based on NCEP/ATP III, the patient's risk factor category is "0-1 risk factors".  From this information, the patient's calculated lipid goals are as follows: Total cholesterol goal is 200; LDL cholesterol goal is 160; HDL cholesterol goal is 40; Triglyceride goal is 150.    Patient Instructions: 1)  Please schedule a follow-up appointment in 6 months. Prescriptions: LISINOPRIL 20 MG TABS (LISINOPRIL) one by mouth qd  #30 x 5   Entered and Authorized by:   D. Thomos Lemons DO   Signed by:   D. Thomos Lemons DO on 05/07/2008   Method used:   Electronically to        [facility] (retail)       [address] [place], [place]  [address]       Ph: [phone]       Fax: [phone]   RxID:   [id number] AMLODIPINE BESYLATE 5 MG TABS (AMLODIPINE BESYLATE) one by mouth once daily  #30 x 5   Entered and Authorized by:   D. Thomos Lemons DO   Signed by:   D. Thomos Lemons DO on 05/07/2008   Method used:   Electronically to        [facility] (retail)       [address] [place], [place]  [address]       Ph: [phone]       Fax: [phone]   RxID:   [id number]       Current Allergies (reviewed today): No known allergies    Tetanus/Td Vaccine    Vaccine Type: Tdap    Site: left deltoid    Mfr: Sanofi Pasteur    Dose: 0.5 ml    Route: IM    Given by: Glendell Docker CMA    Exp. Date: 11/07/2009    Lot #:  [id number]    VIS given: 11/28/06 version given May 07, 2008.

## 2010-02-09 NOTE — Consult Note (Signed)
Summary: [facility]  [facility]   Imported By: Lanelle Bal 02/09/2009 13:31:27  _____________________________________________________________________  External Attachment:    Type:   Image     Comment:   External Document

## 2010-02-09 NOTE — Letter (Signed)
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 [address]   [place], [place] [address]   Phone: [phone]  Fax: [phone]      January 16, 2009   Employee:  [name]      To Whom It May Concern:   For Medical reasons, please excuse the above named employee from work for the following dates:  Start:   01/15/2009  End:   01/18/2009   She may resume normal work schedule on 01/19/2009.If you need additional information, please feel free to contact our office.          Sincerely,    Glendell Docker CMA Dr Thomos Lemons

## 2010-02-09 NOTE — Progress Notes (Signed)
Summary: Status Update on Back  Phone Note Call from Patient Call back at Home Phone [phone]   Caller: Patient Summary of Call: patient called and left voice message stating she waas seen in the ER for her unresolved back pain.  She wanted to check on the status of the physical therapy,and also wanted to let Dr Artist Pais know that she is out of work for the remainder of the week because her back is hurting so bad. She would like to know what Dr Artist Pais advises Initial call taken by: Glendell Docker CMA,  February 12, 2009 8:48 AM  Follow-up for Phone Call        PT referral sent.  she can have out of work note.  I also suggest referral to ortho.  see order Follow-up by: D. Thomos Lemons DO,  February 12, 2009 1:26 PM  Additional Follow-up for Phone Call Additional follow up Details #1::        Appt with Dr Cleophas Dunker    [facility]   Feb 8th Additional Follow-up by: Darral Dash,  February 12, 2009 3:57 PM    Additional Follow-up for Phone Call Additional follow up Details #2::    patient called back requesting a work note for Thursday, Friday and Saturday.  Call placed to patient at [phone], no answer, voice message left informing patient letter left at front desk for patient pick up  Follow-up by: Glendell Docker CMA,  February 12, 2009 4:05 PM

## 2010-02-09 NOTE — Letter (Signed)
Summary: Out of Work  [facility]  [address] [place], [place] [address]   Phone: [phone]  Fax: [phone]    February 19, 2009   Employee:  [name]    To Whom It May Concern:   For Medical reasons, please excuse the above named employee from work for the following dates:  Start:   02/19/2009  End:   02/19/2009  If you need additional information, please feel free to contact our office.         Sincerely,    Thomos Lemons, D.O.

## 2010-02-09 NOTE — Letter (Signed)
Summary: [facility]  [facility]   Imported By: Lanelle Bal 03/10/2009 11:46:25  _____________________________________________________________________  External Attachment:    Type:   Image     Comment:   External Document

## 2010-02-09 NOTE — Letter (Signed)
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 [address]   [place], [place] [address]   Phone: [phone]  Fax: [phone]     February 12, 2009    Employee:  [name]    To Whom It May Concern:   For Medical reasons, please excuse the above named employee from work for the following dates:   Start:   02/12/2009  End:   02/14/2009  She may resume work schedule on 02/16/2009.If you need additional information, please feel free to contact our office.           Sincerely,    Glendell Docker CMA Dr Thomos Lemons

## 2010-02-09 NOTE — Assessment & Plan Note (Signed)
Summary: 6 MONTHS ROV-CH, rescheduled-  jr   Vital Signs:  Patient profile:   45 year old female Weight:      164 pounds BMI:     29.16 O2 Sat:      100 % on Room air Temp:     98.3 degrees F oral Pulse rate:   58 / minute Pulse rhythm:   regular Resp:     16 per minute BP sitting:   124 / 80  (right arm) Cuff size:   regular  Vitals Entered By: Glendell Docker CMA (November 12, 2008 11:16 AM)  O2 Flow:  Room air  Primary Care Provider:  D. Thomos Lemons DO  CC:  6 month follow up disease management.  History of Present Illness: 6 month follow up disease management  Hypertension Follow-Up      This is a 45 year old woman who presents for Hypertension follow-up.  The patient denies lightheadedness, headaches, and edema.  The patient denies the following associated symptoms: chest pain.  Compliance with medications (by patient report) has been near 100%.    headaches - only 3 headaches since last OV.  Her headache specialist is tapering off topamax.  Allergies (verified): No Known Drug Allergies  Past History:  Past Medical History: Hypertension Headache (Chronic migraines - Dr. 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  11/2005  2. 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   3. Adhesions of omentum to anterior wall. Total laparoscopy, assisted hysterectomy, right salpingo- oophorectomy--assisted with daVinci robot, and lysis of omental  adhesions. 11/2005   Meckel's diverticulectomy, small bowel resection and resection of retrocecal appendix.    Family History: Family History Hypertension Colon ca - no Prostate ca - no  Breast ca - no       Social History: Widow/Widower  2007-2008 3 children (1 college, 2 HS) Occupation: Agricultural engineer  Never Smoked   Alcohol use-yes (social)    Physical Exam  General:   alert, well-developed, and well-nourished.   Neck:  supple, no carotid bruits, and thyromegaly.   Lungs:  normal respiratory effort and normal breath sounds.   Heart:  normal rate, regular rhythm, no murmur, and no gallop.   Extremities:  No lower extremity edema    Impression & Recommendations:  Problem # 1:  HYPERTENSION (ICD-401.9) Well controlled.  We discussed low sat fat diet.  FLP before next OV.  Her updated medication list for this problem includes:    Amlodipine Besylate 5 Mg Tabs (Amlodipine besylate) ..... One by mouth once daily    Lisinopril 20 Mg Tabs (Lisinopril) ..... One by mouth qd  BP today: 124/80 Prior BP: 120/80 (05/07/2008)  Prior 10 Yr Risk Heart Disease: 3 % (05/07/2008)  Labs Reviewed: K+: 4.0 (05/05/2008) Creat: : 0.8 (05/05/2008)   Chol: 172 (05/05/2008)   HDL: 37.40 (05/05/2008)   LDL: 121 (05/05/2008)   TG: Samantha.0 (05/05/2008)  Problem # 2:  HEADACHE (ICD-784.0) Assessment: Improved Her headache specialist is weaning off topamax.  Her updated medication list for this problem includes:    Relpax 40 Mg Tabs (Eletriptan hydrobromide) ..... One tablet as needed    Oxycodone-acetaminophen 5-325 Mg Tabs (Oxycodone-acetaminophen) ..... One tablet as needed pain    Ketoprofen 75 Mg Caps (Ketoprofen) ..... One tablet as needed for headaches  Complete Medication List: 1)  Relpax 40 Mg Tabs (Eletriptan hydrobromide) .Marland KitchenMarland KitchenMarland Kitchen  One tablet as needed 2)  Chlorzoxazone 500 Mg Tabs (Chlorzoxazone) .... One tablet as needed 3)  Oxycodone-acetaminophen 5-325 Mg Tabs (Oxycodone-acetaminophen) .... One tablet as needed pain 4)  Ketoprofen 75 Mg Caps (Ketoprofen) .... One tablet as needed for headaches 5)  Topamax 50 Mg Tabs (Topiramate) .... As directed by headache specialist 6)  Amlodipine Besylate 5 Mg Tabs (Amlodipine besylate) .... One by mouth once daily 7)  Lisinopril 20 Mg Tabs (Lisinopril) .... One by mouth qd  Other Orders: Influenza Vaccine NON MCR ([address])  Admin 1st Vaccine ([address])  Patient Instructions: 1)  Please schedule a follow-up appointment in 6 months. 2)  BMP prior to visit, ICD-9:  401.9 3)  Lipid Panel prior to visit, ICD-9: 401.9 4)  Please return for lab work one (1) week before your next appointment.  Prescriptions: LISINOPRIL 20 MG TABS (LISINOPRIL) one by mouth qd  #90 x 1   Entered and Authorized by:   D. Thomos Lemons DO   Signed by:   D. Thomos Lemons DO on 11/12/2008   Method used:   Electronically to        [facility] (retail)       [address] [place], [place]  [address]       Ph: [phone]       Fax: [phone]   RxID:   [id number] AMLODIPINE BESYLATE 5 MG TABS (AMLODIPINE BESYLATE) one by mouth once daily  #90 x 1   Entered and Authorized by:   D. Thomos Lemons DO   Signed by:   D. Thomos Lemons DO on 11/12/2008   Method used:   Electronically to        [facility] (retail)       [address] [place], [place]  [address]       Ph: [phone]       Fax: [phone]   RxID:   [id number]    Immunizations Administered:  Influenza Vaccine # 1:    Vaccine Type: Fluvax Non-MCR    Site: left deltoid    Mfr: GlaxoSmithKline    Dose: 0.5 ml    Route: IM    Given by: Glendell Docker CMA    Exp. Date: 07/09/2009    Lot #: [id number]    VIS given: 08/19/2008  Flu Vaccine Consent Questions:    Do you have a history of severe allergic reactions to this vaccine? no    Any prior history of allergic reactions to egg and/or gelatin? no    Do you have a sensitivity to the preservative Thimersol? no    Do you have a past history of Guillan-Barre Syndrome? no    Do you currently have an acute febrile illness? no    Have you ever had a severe reaction to latex? no    Vaccine information given and explained to patient? yes    Are you currently pregnant? no     Current Allergies (reviewed today): No known allergies

## 2010-02-09 NOTE — Progress Notes (Signed)
Summary: Back & Side Pain  Phone Note Call from Patient Call back at Home Phone [phone]   Caller: Patient Summary of Call: patient called and left voice message stating she is having some unresovled back pain  and tingling in her leg. She states she stayed home from work today and has been soaking in Epsom salt , she has had vomiting x 2 this mornining and she would like to know what she should do.  Patient has been advised to schedule office visit for evaluation. Apppointment scheduled for 01/16/2009 @ 9am Initial call taken by: Glendell Docker CMA,  January 15, 2009 3:16 PM

## 2010-02-09 NOTE — Miscellaneous (Signed)
Summary: PT Renewal/[facility]  PT Renewal/[facility]   Imported By: Lanelle Bal 03/05/2009 12:26:11  _____________________________________________________________________  External Attachment:    Type:   Image     Comment:   External Document

## 2010-02-09 NOTE — Assessment & Plan Note (Signed)
Summary: 1 MONTH ROV-CH   Vital Signs:  Patient profile:   45 year old female Height:      63 inches Weight:      163.50 pounds Temp:     98.5 degrees F oral Pulse rate:   76 / minute Pulse rhythm:   regular Resp:     18 per minute BP sitting:   140 / 90  (left arm) Cuff size:   large  Vitals Entered By: Glendell Docker CMA (April 02, 2008 11:04 AM)  Primary Care Provider:  Dondra Spry DO   History of Present Illness: Hypertension Follow-Up      This is a 45 year old woman who presents for Hypertension follow-up.  The patient denies lightheadedness, urinary frequency, and edema.  The patient denies the following associated symptoms: chest pain.  Compliance with medications (by patient report) has been near 100%.  The patient reports that dietary compliance has been fair.  The patient reports exercising occasionally.    She denies cough.    Migraine headaches have improved since starting ACE inhibitor.  Allergies (verified): No Known Drug Allergies  Past History:  Past Medical History:    Hypertension    Headache (Chronic migraines - Dr. Vela Prose)     Meckel's diverticulum with ischemia. 08/2004  Past Surgical History:    Menorrhagia and dysmenorrhea post endometrial ablation and possible right ovarian lesion.  11/2005     2. 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      3. Adhesions of omentum to anterior wall.    Total laparoscopy, assisted hysterectomy, right salpingo- oophorectomy--assisted with daVinci robot, and lysis of omental  adhesions. 11/2005        Meckel's diverticulectomy, small bowel resection and resection of retrocecal appendix.  PMH reviewed for relevance  Family History:    Family History Hypertension    Colon ca - no    Prostate ca - no     Breast ca - no       Social History:    Widow/Widower  2007-2008    3 children (1 college, 2 HS)    Occupation: Agricultural engineer     Never Smoked    Alcohol use-yes (social)    Physical Exam  General:  alert, well-developed, and well-nourished.   Head:  normocephalic and atraumatic.   Neck:  supple, no carotid bruits, and thyromegaly.   Lungs:  normal respiratory effort and normal breath sounds.   Heart:  normal rate, regular rhythm, and no gallop.   Abdomen:  soft and non-tender.   Extremities:  No lower extremity edema  Neurologic:  cranial nerves II-XII intact and gait normal.     Impression & Recommendations:  Problem # 1:  HYPERTENSION (ICD-401.9) Assessment Improved Increase lisinopril to 20 mg.  BMET before next office visit. Her updated medication list for this problem includes:    Amlodipine Besylate 5 Mg Tabs (Amlodipine besylate) ..... One by mouth once daily    Lisinopril 20 Mg Tabs (Lisinopril) ..... One by mouth qd  BP today: 140/90 Prior BP: 160/102 (02/14/2008)  Problem # 2:  HEADACHE (ICD-784.0) Migraine headaches improved with ACE inhibitor. Patient only takes ketoprofen 2-3 times per month for headache. We discussed risk of using NSAIDs with ACE inhibitors. She will use ketoprofen sparingly. Her updated medication list for this problem includes:    Relpax 40 Mg Tabs (Eletriptan hydrobromide) ..... One tablet as needed    Oxycodone-acetaminophen 5-325 Mg Tabs (Oxycodone-acetaminophen) ..... One tablet  as needed pain    Ketoprofen 75 Mg Caps (Ketoprofen) ..... One tablet as needed for headaches  Problem # 3:  GOITER (ICD-240.9) Thyroid ultrasound negative for goiter or thyroid nodules.  Complete Medication List: 1)  Relpax 40 Mg Tabs (Eletriptan hydrobromide) .... One tablet as needed 2)  Chlorzoxazone 500 Mg Tabs (Chlorzoxazone) .... One tablet as needed 3)  Oxycodone-acetaminophen 5-325 Mg Tabs (Oxycodone-acetaminophen) .... One tablet as needed pain 4)  Ketoprofen 75 Mg Caps (Ketoprofen) .... One tablet as needed for headaches 5)  Topamax 100 Mg Tabs (Topiramate) .... Take 1 tablet by mouth once a day 6)  Amlodipine Besylate 5  Mg Tabs (Amlodipine besylate) .... One by mouth once daily 7)  Lisinopril 20 Mg Tabs (Lisinopril) .... One by mouth qd  Patient Instructions: 1)  Please schedule a follow-up appointment in 1 month. Prescriptions: LISINOPRIL 20 MG TABS (LISINOPRIL) one by mouth qd  #30 x 2   Entered and Authorized by:   D. Thomos Lemons DO   Signed by:   D. Thomos Lemons DO on 04/02/2008   Method used:   Electronically to        [id number] (retail)       [address] [place], [place]  [address]       Ph: [phone]       Fax: [phone]   RxID:   [id number]       Current Allergies (reviewed today): No known allergies

## 2010-02-09 NOTE — Letter (Signed)
Summary: Work Dietitian at [employer]. [address]   [place], [place] [address]   Phone: [phone]  Fax: [phone]      Today's Date: December 11, 2008     Name of Patient: [name]  The above named patient had a medical visit today at Please take this into consideration when reviewing the time away from work.    Special Instructions:  [  ] None  [  ] To be off the remainder of today, returning to the normal work / school schedule tomorrow.  [  ] To be off until the next scheduled appointment on ______________________.  [ X] Other she is restricted to lifting no more than 10-15 pounds for the next 2 weeks      Sincerely yours,   Glendell Docker CMA Dr Thomos Lemons

## 2010-02-09 NOTE — Letter (Signed)
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 [address]   [place], [place] [address]   Phone: [phone]  Fax: [phone]      January 21, 2009    Employee:  [name]    To Whom It May Concern:    For Medical reasons,  the above named employee is restricted to  lifting no more than 15 pounds for the following dates:  Start:   01/21/2009  End:   01/28/2009   If you need additional information, please feel free to contact our office.           Sincerely,    Glendell Docker CMA Dr Thomos Lemons

## 2010-02-09 NOTE — Assessment & Plan Note (Signed)
Summary: 2 week follow up/mhf, resched- jr   Vital Signs:  Patient profile:   45 year old female Weight:      165.75 pounds BMI:     29.47 O2 Sat:      98 % on Room air Temp:     98.2 degrees F oral Pulse rate:   76 / minute Pulse rhythm:   regular Resp:     18 per minute BP sitting:   124 / 90  (left arm) Cuff size:   large  Vitals Entered By: Glendell Docker CMA (February 04, 2009 10:21 AM)  O2 Flow:  Room air  Primary Care Provider:  D. Thomos Lemons DO  CC:  2 week follow up.  History of Present Illness: 45 y/o AA female c/o unresolved pain tip of right buttock, shooting pain to right lower knee  pain increases with sitting for  long periods of time we reviewed MRI results:  MRI of LS spine: Nearly normal examination.  No disc pathology or compressive stenosis.  Very minimal facet degeneration at L5-S1 that could possibly relate to back pain.  No apparent neural compression.  Allergies (verified): No Known Drug Allergies  Past History:  Past Surgical History: Menorrhagia and dysmenorrhea post endometrial ablation and possible right ovarian lesion.  11/2005  2. 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   3. Adhesions of omentum to anterior wall. Total laparoscopy, assisted hysterectomy, right salpingo- oophorectomy--assisted with daVinci robot, and lysis of omental  adhesions. 11/2005    Meckel's diverticulectomy, small bowel resection and resection of retrocecal appendix.       Family History: Family History Hypertension Colon ca - no Prostate ca - no  Breast ca - no           Social History: Widow/Widower  2007-2008 3 children (1 college, 2 HS) Occupation: Agricultural engineer  Never Smoked    Alcohol use-yes (social)      Physical Exam  General:  alert, well-developed, and well-nourished.   Lungs:  normal respiratory effort and normal breath sounds.   Heart:  normal rate, regular rhythm, and no gallop.   Extremities:  No lower  extremity edema  Neurologic:  cranial nerves II-XII intact, gait normal, and DTRs symmetrical and normal.     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  No disc pathology or compressive stenosis.  Very minimal facet degeneration at L5-S1 that could possibly relate to back pain.  No apparent neural compression.  She has tenderness along right pyriformis muscle.  Area injected with mixture of 1% lidocaine and 60 mg of solumedrol.  refer to PT for furthe eval and treatment.   Her updated medication list for this problem includes:    Chlorzoxazone 500 Mg Tabs (Chlorzoxazone) ..... One tablet as needed    Oxycodone-acetaminophen 5-325 Mg Tabs (Oxycodone-acetaminophen) ..... One tablet as needed pain    Ketoprofen 75 Mg Caps (Ketoprofen) ..... One tablet as needed for headaches    Metaxalone 800 Mg Tabs (Metaxalone) ..... One by mouth three times a day  Orders: Physical Therapy Referral (PT)  Complete Medication List: 1)  Relpax 40 Mg Tabs (Eletriptan hydrobromide) .... One tablet as needed 2)  Chlorzoxazone 500 Mg Tabs (Chlorzoxazone) .... One tablet as needed 3)  Oxycodone-acetaminophen 5-325 Mg Tabs (Oxycodone-acetaminophen) .... One tablet as needed pain 4)  Ketoprofen 75 Mg Caps (Ketoprofen) .... One tablet as needed for headaches 5)  Topamax 50  Mg Tabs (Topiramate) .... As directed by headache specialist 6)  Amlodipine Besylate 5 Mg Tabs (Amlodipine besylate) .... One by mouth once daily 7)  Lisinopril 20 Mg Tabs (Lisinopril) .... One by mouth qd 8)  Metaxalone 800 Mg Tabs (Metaxalone) .... One by mouth three times a day 9)  Ondansetron Hcl 4 Mg Tabs (Ondansetron hcl) .... One by mouth three times a day as needed nausea  Patient Instructions: 1)  Please schedule a follow-up appointment in 2 weeks.  Current Allergies (reviewed today): No known allergies      Preventive Care Screening  Pap Smear:     Date:  12/29/2008    Results:  normal

## 2010-05-28 NOTE — H&P (Signed)
[name], [name]                  ACCOUNT NO.:  [id number]   MEDICAL RECORD NO.:  [id number]          PATIENT TYPE:  AMB   LOCATION:  DAY                          FACILITY:  [facility]   PHYSICIAN:  Leonie Man, M.D.   DATE OF BIRTH:  Feb 28, 1965   DATE OF ADMISSION:  08/25/2004  DATE OF DISCHARGE:                                HISTORY & PHYSICAL   CHIEF COMPLAINT:  Abdominal pain.   Ms. Curenton is a 45 year old married black female seen in the [facility]  maternity admission unit on the day of admission because of persistent  abdominal pain with some mild nausea and emesis x1.  This has not been  associated with any fever or leukocytosis.  CT scans of her abdomen show a  5.5 cm tubular mass to the right of the uterus and without direct attachment  to the uterus or to any pelvic-adnexal structures.   PAST MEDICAL HISTORY:  1.  Migraine headaches.  2.  Menometrorrhagia.  3.  Hypertension.   MEDICATIONS:  1.  Hydrochlorothiazide 25 mg daily.  2.  Relpax.  3.  Ketoprofen p.r.n.  4.  Camila.   ALLERGIES:  No known allergies.   PAST SURGICAL HISTORY:  The patient has had a cesarean section in the past,  and she has had a bilateral tubal ligation.   SOCIAL HISTORY:  The patient is an employed, married black female.  No  tobacco or alcohol use.   REVIEW OF SYSTEMS:  Negative in detail and noncontributory.   PHYSICAL EXAMINATION:  HEENT:  Head is normocephalic.  Pupils are round and  regular.  No scleral icterus.  No nasal obstruction.  Oropharynx benign.  NECK:  No thyromegaly.  No cervical adenopathy.  CHEST:  Clear to auscultation bilaterally.  HEART:  Regular rate and rhythm.  No murmurs.  ABDOMEN:  The abdomen is tender in the lower midline and into the right  lower quadrant.  There are no masses.  She is not distended.  Bowel sounds  are normoactive.  There is a well-healed lower transverse incision from her  previous C-section.  There are no groin hernias or  umbilical hernias noted.  EXTREMITIES:  Full range of motion.  No clubbing or cyanosis.  No edema.  There is a well-healed scar over the left hip.  NEUROLOGIC:  The patient is alert and oriented x3.  She moves all 4  extremities well without any gross lateralizing sensory deficits.   LABORATORY EVALUATION:  Urinalysis shows 7-10 white blood cells per high-  powered field.  The remainder of her urinalysis is within normal limits.  CBC:  WBC 5.6, hemoglobin 10.1, hematocrit 31.9.  Comprehensive metabolic  panel:  Sodium 140, potassium 3.5, chloride 98, carbon dioxide 28, glucose  96, BUN 7, creatinine 0.9.  Liver function tests show the albumin to be  marginally low at 2.8.  The remainder of her liver function studies are all  within normal limits.   ASSESSMENT:  1.  Abnormal tubular structure noted to be adjacent and superior to the      uterus and bladder  with associated abdominal pain.  This is consistent      with either Meckel's diverticulum, intestinal __________ , or mucus seal      at the appendix.  2.  Menometrorrhagia:  On Camila.  3.  Migraine headaches.   PLAN:  For admission to the [facility].  We will start her on clear  liquids and our plan is for transfer to [facility] on August 26, 2004, where she should undergo exploratory laparotomy and possible small  bowel resection.      Leonie Man, M.D.  Electronically Signed     PB/MEDQ  D:  08/26/2004  T:  08/26/2004  Job:  [id number]

## 2010-05-28 NOTE — Op Note (Signed)
[name], [name]                  ACCOUNT NO.:  [id number]   MEDICAL RECORD NO.:  [id number]          PATIENT TYPE:  OBV   LOCATION:  1424                         FACILITY:  [facility]   PHYSICIAN:  Genia Del, M.D.DATE OF BIRTH:  [birth year]   DATE OF PROCEDURE:  11/22/2005  DATE OF DISCHARGE:                               OPERATIVE REPORT   PREOP DIAGNOSIS:  Menorrhagia and dysmenorrhea post endometrial ablation  and possible right ovarian lesion.   POSTOPERATIVE DIAGNOSES:  1. Menorrhagia and dysmenorrhea post endometrial ablation and possible      right ovarian lesion.  2. 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  3. Adhesions of omentum to anterior wall.   PROCEDURES:  Total laparoscopy, assisted hysterectomy, right salpingo-  oophorectomy--assisted with daVinci robot, and lysis of omental  adhesions.   SURGEON:  Genia Del, M.D.   ASSISTANT:  Richardean Sale, M.D.   DESCRIPTION OF PROCEDURE:  Under general anesthesia with endotracheal  intubation with the patient is in lithotomy position, she is prepped  with Betadine in the abdominal, suprapubic, vulvar, and vaginal areas.  A bladder catheter is inserted; and the patient is draped as usual.  The  vaginal exam reveals an anteverted uterus, normal volume, no adnexal  mass felt.  We then put in place the Kho ring.  We then make the first  incision at the supraumbilical area after infiltrating with Marcaine.  We make an incision over 1.5 cm with the scalpel.  We opened the  aponeurosis with Mayo scissors,  and the parietal peritoneum bluntly  with a finger.  A pursestring stitch is done with Vicryl #0 at the  aponeurosis.  We then insert the Hasson and the camera at that level.  The pneumoperitoneum with CO2 is created.   We then measure the location of the other trocar placements.  We  infiltrate with Marcaine at each location; and make an incision with the  scalpel.  We enter the robotic  trocar for the three arms and the  assistant trocar.  Those are all inserted under direct vision.  Some  adhesions are present between the omentum and the anterior abdominal  wall close to the camera port.  We used a Potts scissors to coagulate in  section to free enough omentum from the anterior wall to obtain a good  vision.  We then docked the robot after placing the patient in steep  Trendelenburg; and the instruments are put in place.  A Maryland and a  shear scissors as well as a clamp in the fourth arm we start robotic  time.   We coagulate in a section, the left round ligament, and then coagulate  in section, the utero-ovarian ligament on the left; and the tube and go  down along the uterus until we reached the uterine artery.  On the right  side, we note a lesion on the ovary.  It is about 3-4 cm in length.  It  appears solid, probably a myoma.  We, therefore, decide to remove the  right ovary.  We visualize the right ureter in  normal anatomic position.  We coagulate and section the right infundibulopelvic ligament; and the  right round ligament; and we then get to the site of the uterus and  continue until the right uterine artery.  We coagulated and sectioned  the right uterine artery.  We then go on the left side and proceed the  same way, coagulating and sectioning the uterine artery.  We had  previously opened the anterior peritoneum with the scissors and reclined  the bladder down low to free the cervix.  We then used the shear  scissors and the fenestrated bipolar to open, circumferentially, the  upper part of the vagina using the Kho ring as a guide.   The uterus is completely detached, this way and we moved vaginally.  We  then changed instruments and proceeded with closure of the vaginal  vault.  Two needle holders are used and we used a Vicryl #0 on a CT-1.  We closed the vagina completely with figure-of-eights.  When needed, we  suctioned and irrigated the pelvic  cavity with the Nehzat.  Hemostasis  is adequate at all locations.  The instruments are removed.  We then  undock the robot; and remove all trocars after evacuating the CO2.  We  close the supraumbilical incision with the Vicryl pursestring at the  aponeurosis; and then we make a subcuticular stitch of Vicryl 4-0 at  that level.  The assistant port is closed with a subcuticular stitch of  Vicryl 4-0 at the skin.  Dermabond is applied on all incisions.  The  estimated blood loss was 150 mL.  No complications occurred.  A dose of  Ancef 1 gram IV was given at induction.  The patient was brought to  recovery room in good stable status.      Genia Del, M.D.  Electronically Signed     ML/MEDQ  D:  11/22/2005  T:  11/22/2005  Job:  [id number]

## 2010-05-28 NOTE — H&P (Signed)
NAMESHERRYLL, SKOCZYLAS                  ACCOUNT NO.:  [id number]   MEDICAL RECORD NO.:  [id number]          PATIENT TYPE:  AMB   LOCATION:                                FACILITY:  [facility]   PHYSICIAN:  Pershing Cox, M.D.DATE OF BIRTH:  11-01-1965   DATE OF ADMISSION:  DATE OF DISCHARGE:                                HISTORY & PHYSICAL   REASON FOR SURGERY:  Menorrhagia resulting in anemia.   HISTORY OF PRESENT ILLNESS:  The patient is a 45 year old gravida 4, para 3-  0-1-3, married black female.  She presented to [place] in August of 2006 with a  complaint of significant menorrhagia associated with her menstrual periods.  After the birth of her last child, she had been on Norplant and then had a  tubal ligation.  In the interval since then, her bleeding had become heavier  and heavier, and when she presented in August of 2006 she was having heavy  bleeding for up to seven days.  She was first seen in the emergency room and  at that time had a significant anemia.  She was placed on Micronor and was  subsequently sent to my office to be seen.  At that time, she had a small  unremarkable uterus.  She also had an adnexal mass.  She presented again to  the emergency room in August with severe abdominal pain.  A CT scan was  obtained, which showed a bowel mass and she was subsequently taken to the  operating room by Dr. Lurene Shadow and found to have a Meckel's diverticulum.  He  stopped her Micronor at the time of the surgery and she had a subsequent  vaginal bleed, and at that time had a hemoglobin of 8.1.  In August, she was  seen again in our office and was still bleeding.  She started again on her  Micronor and iron.  She received counseling regarding her options and  decided that she wanted proceed with an endometrial ablation.  Sonogramwas  performed, which showed a normal endometrial lining.  This was performed on  October 26.  She received a Depo-Lupron shot of 11.25 mg in November and has  stopped bleeding.  Her hemoglobin is now normal and she is presenting for  ablation procedure.  She has a normal size uterus and is ready to proceed  with this operation.   PAST MEDICAL HISTORY:  ALLERGIES:  NONE.  MEDICATIONS:  1.  Hydrochlorothiazide 25 mg daily.  2.  Hydrocodone PAP for menstrual cramps and Ketoprofen.  3.  Relpax for migraine headaches.  4.  Iron.  SERIOUS MEDICAL ILLNESSES:  Hypertension and intermittent migraine  headaches.   PAST SURGICAL HISTORY:  Tubal ligation in 1999, cesarean section for the  birth of her children.   SOCIAL HISTORY:  The patient is married.  She moved here from [place]  recently.  She is working at [employer] as a Conservation officer, nature.   FAMILY HISTORY:  Mother is alive and is 80 years old, and is bothered by  hypertension.  Her father is unknown.  She has no  siblings.  There is no  history of breast, ovarian, or colon cancer in her family.   Went overreview of systems,was significant only for some nausea associated  with the Micronor and her heavy bleeding.   PHYSICAL EXAMINATION:  VITAL SIGNS:  BP 120/84, pulse 92, weight 166, height  5'3.  HEENT:  Normocephalic, anicteric, EOMI, PERRLA.  NECK:  Thyroid is normal to palpation and no carotid bruits.  LUNGS:  Clear to auscultation and percussion.  CARDIOVASCULAR:  Regular rate and rhythm without murmur.  ABDOMEN:  Soft, nontender, no guarding and no rebound.  There is a lower  abdominal scar transverse consistent with a previous C-section.  NODES:  No supraclavicular, axillary or inguinal adenopathy.  SKIN:  No noted skin lesions.  NEURO/PSYCH:  Orientedto person, place and time.  Mood and affect are  approximate.  BREAST:  No palpable breast mass.  BACK:  No CVA tenderness.  PELVIC:  Normal external genitalia.  The vagina is normal.  The cervix is  normal position.  The uterus is well supported at anteflex, mobile and  nontender.  There are no palpable adnexal masses on her exam.  The  urethral  meatus and urethra are normal.  Bladder is well supported.  Anus and  perineum are intact.  Rectal examination is normal.   ASSESSMENT:  1.  Dysfunctional uterine bleeding with menorrhagia resulting in anemia.  2.  History of a recent exploratory laparotomy for a bowel mass consistent      with a benign Meckel's.  3.  Hypertension.  4.  Migraine headaches.   PLAN:  The patient will have a diagnostic hysteroscopy prior to her NovaSure  ablation today.      Pershing Cox, M.D.  Electronically Signed     MAJ/MEDQ  D:  01/29/2005  T:  01/29/2005  Job:  [id number]

## 2010-05-28 NOTE — Discharge Summary (Signed)
NAMEJILLISA, HARRIS                  ACCOUNT NO.:  [id number]   MEDICAL RECORD NO.:  [id number]          PATIENT TYPE:  INP   LOCATION:  9302                          FACILITY:  [facility]   PHYSICIAN:  Leonie Man, M.D.   DATE OF BIRTH:  Jan 22, 1965   DATE OF ADMISSION:  08/25/2004  DATE OF DISCHARGE:  08/31/2004                                 DISCHARGE SUMMARY   ADMISSION DIAGNOSES:  Abdominal pain, probable Meckel's diverticulum,  retrocecal or mucocele of the appendix.   DISCHARGE DIAGNOSES:  Meckel's diverticulum with ischemia.   PROCEDURE:  Meckel's diverticulectomy, small bowel resection and resection  of retrocecal appendix.   COMPLICATIONS:  None.   CONDITION ON DISCHARGE:  Improved.   HOSPITAL COURSE:  Ms. Blazier is a 45 year old female presenting with  abdominal pain and vaginal bleeding.  The patient was seen in maternity  admissions at the time of her pain and was noted to have no leukocytosis or  fever. She had a CT scan which showed a 5.5 cm tubular mass appearing to the  right of the uterus and the bladder without any direct attachment to this.  The patient had been treated previously for menometrorrhagia by her  gynecologist and had been controlled on Camilla.  He had a past history of  migraine headaches and hypertension.  Following evaluation the patient was  admitted to the hospital, placed on analgesia and prepared for surgery.  On  August 26, 2004 she was taken to the operating room where she underwent  exploratory laparotomy which found her Meckel's diverticulum. She also  underwent appendectomy for a retrocecal appendix.  Her postoperative course  has been benign with normal resumption of diet and bowel activity.  She is  ambulating well and tolerating a regular diet and her pain level is well  under control.  At the time of discharge her incision is healing well.  She  is being discharged now to be followed up in the office in two to three  weeks.   DISCHARGE  MEDICATIONS:  1.  Vicodin one to two tablets q.4-6h. PRN pain for which she has been given      a prescription.  2.  She has been asked to continue her usual home medications which include      hydrochlorothiazide, Relpax, ketoprofen, tandem, Camilla.   ACTIVITY:  Her activity is as tolerated except that she is asked not to lift  any heavy weights, heavier than 15 pounds for the next four to six weeks.   DIET:  Unrestricted.   FOLLOW UP:  Follow up will be in our office in two to three weeks.      Leonie Man, M.D.  Electronically Signed     PB/MEDQ  D:  08/31/2004  T:  08/31/2004  Job:  [id number]

## 2010-05-28 NOTE — Op Note (Signed)
[name], [name]                  ACCOUNT NO.:  [id number]   MEDICAL RECORD NO.:  [id number]          PATIENT TYPE:  AMB   LOCATION:  SDC                           FACILITY:  [facility]   PHYSICIAN:  Pershing Cox, M.D.DATE OF BIRTH:  [birth year]   DATE OF PROCEDURE:  02/04/2005  DATE OF DISCHARGE:                                 OPERATIVE REPORT   PREOPERATIVE DIAGNOSIS:  Menorrhagia resulting in anemia.   HISTORY OF PRESENT ILLNESS:  The patient presented to the emergency room  with significant menorrhagia associated with menstrual period.  She was  first seen in the emergency room in August 2006 and at that time, she had a  significant anemia, was placed on Micronor and subsequently referred to my  office.  She had a small, unremarkable uterus but an adnexal mass.  Later  that month she presented with severe abdominal pain and CT was performed,  which showed a bowel mass.  She was subsequently taken to the operating room  and found to have a Meckel's diverticulum, which was resected.  After her  Micronor was stopped, she had significant vaginal bleeding with a hemoglobin  of 8.1.  She was seen again in our office and started back on Micronor.  After counseling, she decided to proceed with an endometrial ablation.  A  sonogram was performed, which showed a normal endometrial lining.  She was  given Lupron Depot shot in October and has now normalized her hemoglobin and  is presenting for endometrial ablation.   OPERATIVE FINDINGS:  The patient's uterus is about eight weeks in size.  There are no palpable adnexal masses.  The cavity sounds to a depth of 8 cm.  The endocervical os was 2.5 cm in length.  There were no endometrial filling  defects.   PROCEDURE:  [name] was brought to the operating room with an IV in  place.  She received 1 g of Ancef in the holding area.  Supine on the OR  table, IV sedation was administered.  Once she had had an LMA placed and was  anesthetized,  she was placed into [employer] stirrups.  Betadine was used to prep  the perineum and vagina.  A red rubber catheter was used to sterilely empty  the bladder.  The patient was then draped for sterile vaginal procedure with  the collecting drape beneath her hips.  A bivalve speculum was inserted into  the vagina and the cervix was grasped with a single-tooth tenaculum.  She  had significant  stenosis at the external os.  At this point Marcaine  paracervical block was performed at the 3, 4, 7 and 8 positions using a  total volume of 10 mL of this solution.  The nurses acquired many dilators  and using toothpick-size dilators, the external os was dilated and the  remainder of the dilatation was quite simple.  The cervix was dilated to a  size 25 Pratt dilator, and the diagnostic scope was introduced into the  endometrial cavity.  Using through-and-through sorbitol irrigation, the  cavity was distended and photographs were  taken.  This was removed and the  ectocervix was further dilated until it would admit a #8 Hegar dilator.  The  NovaSure apparatus was then inserted through the endocervical canal until  the leading edge touched the uterine fundus.  It was retracted approximately  a centimeter and then the instrument was closed firmly.  The instrument was  then advanced to the fundus and retracted, advanced to the fundus and  retracted.  I then manipulated the apparatus by moving it up and down, side  to side, and then with a circular motion.  Once this had been completely  seated, the seal for the cervix was advanced and with traction on the  tenaculum on the cervix, we tested the cavity and it was  intact.  We then proceeded with the ablation procedure, which took about 1  minute 37 seconds.  The NovaSure was closed.  The apparatus was retracted  through the cervix and opened outside to visualize that the entire array was  totally intact.  The tenaculum was removed and the procedure had been   completed.      Pershing Cox, M.D.  Electronically Signed     MAJ/MEDQ  D:  02/04/2005  T:  02/04/2005  Job:  [id number]

## 2010-05-28 NOTE — Op Note (Signed)
[name], [name]                  ACCOUNT NO.:  [id number]   MEDICAL RECORD NO.:  [id number]          PATIENT TYPE:  INP   LOCATION:  9302                          [facility]ACILITY:  [facility]   PHYSICIAN:  Leonie Man, M.D.   DATE OF BIRTH:  20-Dec-1965   DATE OF PROCEDURE:  08/26/2004  DATE OF DISCHARGE:                                 OPERATIVE REPORT   PREOPERATIVE NOTE:  Meckel's diverticulum.   POSTOPERATIVE DIAGNOSES:  1.  Meckel's diverticulum  2.  Retrocecal appendix.   PROCEDURE:  1.  Small bowel resection for excision of Meckel's diverticulum.  2.  Appendectomy.   SURGEON:  Leonie Man, M.D.   ASSISTANT:  Dr. Cyndia Bent.   ANESTHESIA:  General.   SPECIMENS FORWARDED TO PATHOLOGY:  1.  Meckel's diverticulum.  2.  Ruptured appendix.   ESTIMATED BLOOD LOSS:  Minimal.   COMPLICATIONS:  None.   CONDITION:  To the PACU in excellent.   INDICATIONS:  The patient is a 45 year old female presenting with increasing  right lower quadrant abdominal pain associated with nausea and with one  episode of vomiting. She was evaluated by CT scanning and noted to have a  tubular structure just superior and adjacent to the uterus and bladder. The  appendix was not seen on CT scanning. Because of the patient's pain and the  presence of this mass in the abdomen felt to be either a Meckel's  diverticulum or a duplication cyst, the patient is brought now to the  operating room for exploratory laparotomy.   PROCEDURE:  Following the induction of satisfactory general anesthesia with  the patient positioned supinely, the abdomen is prepped and draped to be  included in a sterile operative field. The exploratory laparotomy is carried  out through a lower midline incision deep and through the skin, subcutaneous  tissue, through linea alba. Upon entering the peritoneum, there was a  moderate amount of free but clear colored intraperitoneal fluid. The uterus  was noted be of normal size,  the small bowel was on both the right and left  ovaries and there appeared to be small ovarian cysts. The small bowel was  run from the cecum retrograde to a portion of approximately 1-1/2 feet from  the ileocecal valve where a large wide-based Meckel's diverticulum was  encountered. The diverticulum was in fact twisted on its mesentery and  obstructed. The mesentery to the small bowel underlying the diverticulum was  taken between clamps and secured with ties of 2-0 silk. Then a functional  end to end stapled anastomosis was carried out with a GIA stapler both  proximal and distal to the Meckel's diverticulum. The bowel closed with a TA-  60 stapling device. The resulting anastomosis was noted to be widely patent,  the Meckel's diverticulum was resected from the bowel and forwarded for  pathologic evaluation. The mesenteric defect was closed with a single suture  of 2-0 silk and a crotch stitch was placed to hold the anastomosis intact.   Attention was then turned to the cecum where it was noted that the appendix  was  in a far retrocecal position. It was thought prudent that the appendix  should be taken at this point should the patient develop appendicitis in the  future. The retroperitoneal attachments were then lysed and the mesoappendix  taken between clamps and secured with ties of 2-0 silk. The base of appendix  was then suture ligated with a 2-0 silk suture and the appendix was  amputated and the stump cauterized. The stump was then inverted with a Z  stitch. All other areas of dissection were checked for hemostasis and noted  to be dry. The peritoneal cavity was thoroughly irrigated with several  aliquots of normal saline and aspirated. Sponge, instrument and sharp counts  were verified. The midline incision was closed with running #1 PDS suture.  The subcutaneous tissue was irrigated and skin closed with staples. Sterile  dressings applied. Anesthetic reversed. The patient removed  from the  operating room to the recovery room in stable condition. She tolerated the  procedure well.      Leonie Man, M.D.  Electronically Signed     PB/MEDQ  D:  08/26/2004  T:  08/26/2004  Job:  [id number]   cc:   Pershing Cox, M.D.  [address]  [place]  [place] [address]  Fax: [phone]

## 2010-07-08 ENCOUNTER — Encounter: Payer: Self-pay | Admitting: Family

## 2010-07-09 ENCOUNTER — Encounter: Payer: Self-pay | Admitting: Family

## 2010-07-09 ENCOUNTER — Ambulatory Visit (INDEPENDENT_AMBULATORY_CARE_PROVIDER_SITE_OTHER): Payer: Managed Care, Other (non HMO) | Admitting: Family

## 2010-07-09 VITALS — BP 146/102 | HR 90 | Temp 97.8°F | Resp 16 | Ht 63.0 in | Wt 156.0 lb

## 2010-07-09 DIAGNOSIS — I10 Essential (primary) hypertension: Secondary | ICD-10-CM

## 2010-07-09 LAB — BASIC METABOLIC PANEL
BUN: 16 mg/dL (ref 6–23)
CO2: 27 mEq/L (ref 19–32)
Calcium: 9.7 mg/dL (ref 8.4–10.5)
Chloride: 105 mEq/L (ref 96–112)
Creat: 0.86 mg/dL (ref 0.50–1.10)
Glucose, Bld: 84 mg/dL (ref 70–99)
Potassium: 4.2 mEq/L (ref 3.5–5.3)
Sodium: 140 mEq/L (ref 135–145)

## 2010-07-09 MED ORDER — NEBIVOLOL HCL 5 MG PO TABS: 5.0000 mg | ORAL_TABLET | Freq: Every day | ORAL | Status: DC

## 2010-07-09 MED ORDER — LISINOPRIL 20 MG PO TABS: 20.0000 mg | ORAL_TABLET | Freq: Every day | ORAL | Status: DC

## 2010-07-09 NOTE — Progress Notes (Signed)
Subjective:    Patient ID: [name], female    DOB: 1965-08-15, 45 y.o.   MRN: [id number]  HPI  Patient presents today for followup of hypertension.  Patient has been treated for Chronic HTN for quiet sometime. She has been out of her medications for 1 month.  Currently poorly controlled. No associated S/S related to HTN.   Quality: chronic Modifying factor: meds Duration: Quite sometime Associated S/S: None.  The patient denies the following associated symptoms: Chest pain, dyspnea, blurred vision, headache.  She has had some gum bleeding and  lower extremity edema.     Review of Systems See HPI  Past Medical History  Diagnosis Date  . Hypertension   . Headache     Chronic migraines--Dr Lewitt  . Meckel's diverticulum 08/2004    with ischemia  . 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 Uterine adhesions     History   Social History  . Marital Status: Single    Spouse Name: N/A    Number of Children: 3  . Years of Education: N/A   Occupational History  . Cashier    Social History Main Topics  . Smoking status: Never Smoker   . Smokeless tobacco: Not on file  . Alcohol Use: Yes     social  . Drug Use: Not on file  . Sexually Active: Not on file   Other Topics Concern  . Not on file   Social History Narrative  . No narrative on file    Past Surgical History  Procedure Date  . Laparoscopy 11/2005    total laparoscopy  . Laparoscopic salpingoopherectomy 11/2005    right-- assisted with daVinci robot, and lysis of omental adhesions.  . Abdominal hysterectomy 11/2005  . Other surgical history     Meckel's diverticulectomy, small bowel resection and resection of retrocecal appendix.    Family History  Problem Relation Age of Onset  . Cancer Neg Hx   . Hypertension Other     No Known Allergies  Current Outpatient Prescriptions on File Prior to Visit  Medication Sig Dispense Refill  . amLODipine (NORVASC) 5 MG tablet Take 5 mg by mouth daily.          . chlorzoxazone (PARAFON) 500 MG tablet Take 500 mg by mouth as needed.        . eletriptan (RELPAX) 40 MG tablet One tablet by mouth as needed for migraine headache.  If the headache improves and then returns, dose may be repeated after 2 hours have elapsed since first dose (do not exceed 80 mg per day). may repeat in 2 hours if necessary       . ketoprofen (ORUDIS) 75 MG capsule Take 1 tablet as needed for headaches.       Marland Kitchen lisinopril (PRINIVIL,ZESTRIL) 20 MG tablet Take 20 mg by mouth daily.        . metaxalone (SKELAXIN) 800 MG tablet Take 800 mg by mouth 3 (three) times daily.        . ondansetron (ZOFRAN) 4 MG tablet Take 4 mg by mouth 3 (three) times daily as needed. For nausea       . oxyCODONE-acetaminophen (PERCOCET) 5-325 MG per tablet Take 1 tablet by mouth daily as needed. For pain.       Marland Kitchen DISCONTD: topiramate (TOPAMAX) 50 MG tablet As directed by headache specialist.         BP 146/102  Pulse 90  Temp(Src) 97.8 F (36.6 C) (Oral)  Resp 16  Ht 5\' 3"  (1.6 m)  Wt 156 lb 0.6 oz (70.779 kg)  BMI 27.64 kg/m2  LMP 11/10/2005       Objective:   Physical Exam  Constitutional: She appears well-developed and well-nourished.  HENT:  Head: Normocephalic and atraumatic.  Pulmonary/Chest: Effort normal and breath sounds normal.  Abdominal: Soft.  Musculoskeletal:       Trace bilateral lower extremity edema.   Psychiatric: She has a normal mood and affect. Her behavior is normal. Judgment and thought content normal.          Assessment & Plan:  bystolic [id number] exp 10/14 #35

## 2010-07-09 NOTE — Assessment & Plan Note (Signed)
Deteriorated.  Pt's dentist wants her to discontinue amlodipine as they feel that it is contributing to her gum swelling and bleeding.  Will stop amlodipine and start bystolic in its place.  Check BMET today.  Pt instructed to follow up in 1 month.

## 2010-07-09 NOTE — Patient Instructions (Signed)
Stop amlodipine, start bystolic. Please complete your lab work on the first floor. Follow up in 1 month for your blood pressure check.

## 2010-07-11 ENCOUNTER — Encounter: Payer: Self-pay | Admitting: Family

## 2010-08-04 ENCOUNTER — Ambulatory Visit (INDEPENDENT_AMBULATORY_CARE_PROVIDER_SITE_OTHER): Payer: Managed Care, Other (non HMO) | Admitting: Family

## 2010-08-04 ENCOUNTER — Encounter: Payer: Self-pay | Admitting: Family

## 2010-08-04 VITALS — BP 138/98 | HR 66 | Temp 98.2°F | Resp 12 | Ht 63.0 in | Wt 155.1 lb

## 2010-08-04 DIAGNOSIS — I10 Essential (primary) hypertension: Secondary | ICD-10-CM

## 2010-08-04 MED ORDER — NEBIVOLOL HCL 5 MG PO TABS: 5.0000 mg | ORAL_TABLET | Freq: Every day | ORAL | Status: DC

## 2010-08-04 MED ORDER — HYDROCHLOROTHIAZIDE 25 MG PO TABS: 25.0000 mg | ORAL_TABLET | Freq: Every day | ORAL | Status: DC

## 2010-08-04 NOTE — Patient Instructions (Signed)
Please follow up in 1 month.  

## 2010-08-04 NOTE — Assessment & Plan Note (Signed)
BP is improved, but DBP is not at goal.  Will add HCTZ, continue lisinopril and bystolic. Follow up in 1 month.

## 2010-08-04 NOTE — Progress Notes (Signed)
Subjective:    Patient ID: [name], female    DOB: 03-04-1965, 45 y.o.   MRN: [id number]  HPI  Patient presents today for followup of hypertension.  Patient has been treated for Chronic HTN for quiet sometime. She is currently on Bystolic and lisinopril, and well controlled. No associated S/S related to HTN.   Quality: chronic Modifying factor: meds Duration: Quite sometime Associated S/S: None.  The patient denies the following associated symptoms: Chest pain, dyspnea, blurred vision, headache, or lower extremity edema.     Review of Systems  See HPI  Past Medical History  Diagnosis Date  . Hypertension   . Headache     Chronic migraines--Dr Lewitt  . Meckel's diverticulum 08/2004    with ischemia  . 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 Uterine adhesions     History   Social History  . Marital Status: Single    Spouse Name: N/A    Number of Children: 3  . Years of Education: N/A   Occupational History  . Cashier    Social History Main Topics  . Smoking status: Never Smoker   . Smokeless tobacco: Not on file  . Alcohol Use: Yes     social  . Drug Use: Not on file  . Sexually Active: Not on file   Other Topics Concern  . Not on file   Social History Narrative  . No narrative on file    Past Surgical History  Procedure Date  . Laparoscopy 11/2005    total laparoscopy  . Laparoscopic salpingoopherectomy 11/2005    right-- assisted with daVinci robot, and lysis of omental adhesions.  . Abdominal hysterectomy 11/2005  . Other surgical history     Meckel's diverticulectomy, small bowel resection and resection of retrocecal appendix.    Family History  Problem Relation Age of Onset  . Cancer Neg Hx   . Hypertension Other     No Known Allergies  Current Outpatient Prescriptions on File Prior to Visit  Medication Sig Dispense Refill  . chlorzoxazone (PARAFON) 500 MG tablet Take 500 mg by mouth as needed.        . eletriptan (RELPAX) 40 MG  tablet One tablet by mouth as needed for migraine headache.  If the headache improves and then returns, dose may be repeated after 2 hours have elapsed since first dose (do not exceed 80 mg per day). may repeat in 2 hours if necessary       . ketoprofen (ORUDIS) 75 MG capsule Take 1 tablet as needed for headaches.       Marland Kitchen lisinopril (PRINIVIL,ZESTRIL) 20 MG tablet Take 1 tablet (20 mg total) by mouth daily.  30 tablet  5  . metaxalone (SKELAXIN) 800 MG tablet Take 800 mg by mouth 3 (three) times daily.        . ondansetron (ZOFRAN) 4 MG tablet Take 4 mg by mouth 3 (three) times daily as needed. For nausea       . oxyCODONE-acetaminophen (PERCOCET) 5-325 MG per tablet Take 1 tablet by mouth daily as needed. For pain.         BP 138/98  Pulse 66  Temp(Src) 98.2 F (36.8 C) (Oral)  Resp 12  Ht 5\' 3"  (1.6 m)  Wt 155 lb 1.9 oz (70.362 kg)  BMI 27.48 kg/m2  LMP 11/10/2005        Objective:   Physical Exam  Constitutional: She appears well-developed and well-nourished.  Cardiovascular: Normal rate and  regular rhythm.   Musculoskeletal: She exhibits no edema.  Skin: Skin is warm and dry.  Psychiatric: She has a normal mood and affect. Her behavior is normal. Judgment and thought content normal.          Assessment & Plan:   BP Readings from Last 3 Encounters:  08/04/10 138/98  07/09/10 146/102  02/19/09 132/90

## 2010-08-12 ENCOUNTER — Telehealth: Payer: Self-pay | Admitting: *Deleted

## 2010-08-12 MED ORDER — NEBIVOLOL HCL 5 MG PO TABS: 5.0000 mg | ORAL_TABLET | Freq: Every day | ORAL | Status: DC

## 2010-08-12 NOTE — Telephone Encounter (Signed)
Received message from pharmacy that pt was needing refill on Bystolic. Refill sent #30 x 3 refills per 08/04/10 documentation.

## 2010-09-01 ENCOUNTER — Ambulatory Visit (INDEPENDENT_AMBULATORY_CARE_PROVIDER_SITE_OTHER): Payer: Managed Care, Other (non HMO) | Admitting: Family

## 2010-09-01 ENCOUNTER — Encounter: Payer: Self-pay | Admitting: Family

## 2010-09-01 VITALS — BP 130/90 | HR 60 | Temp 98.2°F | Resp 16 | Ht 63.0 in | Wt 151.1 lb

## 2010-09-01 DIAGNOSIS — I10 Essential (primary) hypertension: Secondary | ICD-10-CM

## 2010-09-01 LAB — BASIC METABOLIC PANEL
BUN: 15 mg/dL (ref 6–23)
CO2: 27 mEq/L (ref 19–32)
Calcium: 9.9 mg/dL (ref 8.4–10.5)
Chloride: 101 mEq/L (ref 96–112)
Creat: 0.89 mg/dL (ref 0.50–1.10)
Glucose, Bld: 88 mg/dL (ref 70–99)
Potassium: 4 mEq/L (ref 3.5–5.3)
Sodium: 138 mEq/L (ref 135–145)

## 2010-09-01 NOTE — Assessment & Plan Note (Signed)
Pt's DBP is improved today.  Continue current meds.  Will check BMET now that she has started HCTZ.  Pt instructed to f/u in 3 months.

## 2010-09-01 NOTE — Progress Notes (Signed)
Subjective:    Patient ID: [name], female    DOB: 1965/01/29, 45 y.o.   MRN: [id number]  HPI  Patient presents today for followup of hypertension.  Patient has been treated for Chronic HTN for quiet sometime. She is currently on lisinopril, HCTZ and bystolic and well controlled. No associated S/S related to HTN.   Quality: chronic Modifying factor: meds Duration: Quite sometime Associated S/S: None.  The patient denies the following associated symptoms: Chest pain, dyspnea, blurred vision, headache, or lower extremity edema.     Review of Systems See hpi  Past Medical History  Diagnosis Date  . Hypertension   . Headache     Chronic migraines--Dr Lewitt  . Meckel's diverticulum 08/2004    with ischemia  . 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 Uterine adhesions     History   Social History  . Marital Status: Single    Spouse Name: N/A    Number of Children: 3  . Years of Education: N/A   Occupational History  . Cashier    Social History Main Topics  . Smoking status: Never Smoker   . Smokeless tobacco: Not on file  . Alcohol Use: Yes     social  . Drug Use: Not on file  . Sexually Active: Not on file   Other Topics Concern  . Not on file   Social History Narrative  . No narrative on file    Past Surgical History  Procedure Date  . Laparoscopy 11/2005    total laparoscopy  . Laparoscopic salpingoopherectomy 11/2005    right-- assisted with daVinci robot, and lysis of omental adhesions.  . Abdominal hysterectomy 11/2005  . Other surgical history     Meckel's diverticulectomy, small bowel resection and resection of retrocecal appendix.    Family History  Problem Relation Age of Onset  . Cancer Neg Hx   . Hypertension Other     No Known Allergies  Current Outpatient Prescriptions on File Prior to Visit  Medication Sig Dispense Refill  . chlorzoxazone (PARAFON) 500 MG tablet Take 500 mg by mouth as needed.        . eletriptan (RELPAX) 40 MG  tablet One tablet by mouth as needed for migraine headache.  If the headache improves and then returns, dose may be repeated after 2 hours have elapsed since first dose (do not exceed 80 mg per day). may repeat in 2 hours if necessary       . hydrochlorothiazide 25 MG tablet Take 1 tablet (25 mg total) by mouth daily.  30 tablet  2  . ketoprofen (ORUDIS) 75 MG capsule Take 1 tablet as needed for headaches.       Marland Kitchen lisinopril (PRINIVIL,ZESTRIL) 20 MG tablet Take 1 tablet (20 mg total) by mouth daily.  30 tablet  5  . metaxalone (SKELAXIN) 800 MG tablet Take 800 mg by mouth 3 (three) times daily as needed.       . nebivolol (BYSTOLIC) 5 MG tablet Take 1 tablet (5 mg total) by mouth daily.  30 tablet  3  . ondansetron (ZOFRAN) 4 MG tablet Take 4 mg by mouth 3 (three) times daily as needed. For nausea       . oxyCODONE-acetaminophen (PERCOCET) 5-325 MG per tablet Take 1 tablet by mouth daily as needed. For pain.         BP 130/90  Pulse 60  Temp(Src) 98.2 F (36.8 C) (Oral)  Resp 16  Ht 5'  3" (1.6 m)  Wt 151 lb 1.3 oz (68.529 kg)  BMI 26.76 kg/m2  LMP 11/10/2005       Objective:   Physical Exam  Constitutional: She appears well-developed and well-nourished.  Cardiovascular: Normal rate and regular rhythm.   No murmur heard. Pulmonary/Chest: Effort normal and breath sounds normal. No respiratory distress. She has no wheezes. She has no rales. She exhibits no tenderness.  Musculoskeletal: Normal range of motion.  Skin: Skin is warm and dry.  Psychiatric: She has a normal mood and affect. Her behavior is normal. Judgment and thought content normal.          Assessment & Plan:

## 2010-09-01 NOTE — Patient Instructions (Signed)
Please complete your blood work on the first floor.  Follow up in 3 months.  

## 2010-09-03 ENCOUNTER — Encounter: Payer: Self-pay | Admitting: Family

## 2010-10-13 ENCOUNTER — Other Ambulatory Visit (HOSPITAL_COMMUNITY): Payer: Self-pay | Admitting: Obstetrics & Gynecology

## 2010-10-13 DIAGNOSIS — Z1231 Encounter for screening mammogram for malignant neoplasm of breast: Secondary | ICD-10-CM

## 2010-10-21 ENCOUNTER — Ambulatory Visit (INDEPENDENT_AMBULATORY_CARE_PROVIDER_SITE_OTHER): Payer: Managed Care, Other (non HMO) | Admitting: Internal Medicine

## 2010-10-21 ENCOUNTER — Encounter: Payer: Self-pay | Admitting: Internal Medicine

## 2010-10-21 VITALS — BP 110/80 | HR 61 | Temp 98.4°F | Resp 16 | Wt 151.0 lb

## 2010-10-21 DIAGNOSIS — M79609 Pain in unspecified limb: Secondary | ICD-10-CM

## 2010-10-21 DIAGNOSIS — M79601 Pain in right arm: Secondary | ICD-10-CM

## 2010-10-21 MED ORDER — METHYLPREDNISOLONE (PAK) 4 MG PO TABS: 4.0000 mg | ORAL_TABLET | Freq: Every day | ORAL | Status: DC

## 2010-10-21 NOTE — Progress Notes (Signed)
  Subjective:    Patient ID: [name], female    DOB: September 07, 1965, 45 y.o.   MRN: [id number]  HPI Pt presents to clinic for evaluation of arm pain. Notes 3+ day h/o right arm pain that radiates down from the upper arm and involves right hand numbness without weakness. No injury/trauma but does regular heavy lifting at work. Does experience some right low lateral posterior neck pain. Takes ketoprofen without gi adverse and attempted percocet without much success. No other alleviating or exacerbating factors. No other complaints.  Past Medical History  Diagnosis Date  . Hypertension   . Headache     Chronic migraines--Dr Lewitt  . Meckel's diverticulum 08/2004    with ischemia  . 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 Uterine adhesions    Past Surgical History  Procedure Date  . Laparoscopy 11/2005    total laparoscopy  . Laparoscopic salpingoopherectomy 11/2005    right-- assisted with daVinci robot, and lysis of omental adhesions.  . Abdominal hysterectomy 11/2005  . Other surgical history     Meckel's diverticulectomy, small bowel resection and resection of retrocecal appendix.    reports that she has never smoked. She has never used smokeless tobacco. She reports that she drinks alcohol. Her drug history not on file. family history includes Hypertension in her other.  There is no history of Cancer. No Known Allergies     Review of Systems see hpi     Objective:   Physical Exam  Nursing note and vitals reviewed. Constitutional: She appears well-developed and well-nourished. No distress.  HENT:  Head: Normocephalic and atraumatic.  Right Ear: External ear normal.  Left Ear: External ear normal.  Eyes: Conjunctivae are normal. No scleral icterus.  Neck: Neck supple.  Musculoskeletal:       FROM of right arm. No crepitus or inflammatory changes of joints. NT wrist, elbow, and shoulder. +mild tenderness along biceps muscle. Strength 5/5 distal handgrip and mcp muscles    Neurological: She is alert.  Skin: Skin is warm and dry. No rash noted. She is not diaphoretic.  Psychiatric: She has a normal mood and affect.          Assessment & Plan:

## 2010-10-21 NOTE — Assessment & Plan Note (Signed)
May represent muscular strain however there are features of radicular/neuropathic pain. Neurologically nonfocal. Work note provided. Attempt medrol dosepak. Analgesia prn. Followup if no improvement or worsening.

## 2010-10-27 ENCOUNTER — Ambulatory Visit (HOSPITAL_COMMUNITY)
Admission: RE | Admit: 2010-10-27 | Discharge: 2010-10-27 | Disposition: A | Discharge status: Home / Self Care | Payer: Managed Care, Other (non HMO) | Source: Ambulatory Visit | Attending: Obstetrics & Gynecology | Admitting: Obstetrics & Gynecology

## 2010-10-27 DIAGNOSIS — Z1231 Encounter for screening mammogram for malignant neoplasm of breast: Secondary | ICD-10-CM

## 2010-11-12 ENCOUNTER — Encounter: Payer: Self-pay | Admitting: Family

## 2010-11-12 ENCOUNTER — Ambulatory Visit: Payer: Managed Care, Other (non HMO) | Admitting: Family

## 2010-11-12 ENCOUNTER — Ambulatory Visit (INDEPENDENT_AMBULATORY_CARE_PROVIDER_SITE_OTHER): Payer: Managed Care, Other (non HMO) | Admitting: Family

## 2010-11-12 DIAGNOSIS — M5430 Sciatica, unspecified side: Secondary | ICD-10-CM

## 2010-11-12 MED ORDER — CYCLOBENZAPRINE HCL 5 MG PO TABS: 5.0000 mg | ORAL_TABLET | Freq: Three times a day (TID) | ORAL | Status: AC | PRN

## 2010-11-12 MED ORDER — METHYLPREDNISOLONE 4 MG PO KIT: PACK | ORAL | Status: AC

## 2010-11-12 NOTE — Patient Instructions (Signed)
Please call if your symptoms worsen or if they do not improve.  Do not drive while you are using the muscle relaxant.

## 2010-11-12 NOTE — Progress Notes (Signed)
Subjective:    Patient ID: [name], female    DOB: [birth year], 45 y.o.   MRN: [id number]  HPI  [name] is a 45 yr old female who presents today with chief complaint of pain radiating down the back of the right leg.  Started yesterday. Reports pain is 8/10.  Worsened by prolonged sitting.  Previous history of sciatica.    Review of Systems    see HPI  Past Medical History  Diagnosis Date  . Hypertension   . Headache     Chronic migraines--Dr Lewitt  . Meckel's diverticulum 08/2004    with ischemia  . 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 Uterine adhesions     History   Social History  . Marital Status: Single    Spouse Name: N/A    Number of Children: 3  . Years of Education: N/A   Occupational History  . Cashier    Social History Main Topics  . Smoking status: Never Smoker   . Smokeless tobacco: Never Used  . Alcohol Use: Yes     social  . Drug Use: Not on file  . Sexually Active: Not on file   Other Topics Concern  . Not on file   Social History Narrative  . No narrative on file    Past Surgical History  Procedure Date  . Laparoscopy 11/2005    total laparoscopy  . Laparoscopic salpingoopherectomy 11/2005    right-- assisted with daVinci robot, and lysis of omental adhesions.  . Abdominal hysterectomy 11/2005  . Other surgical history     Meckel's diverticulectomy, small bowel resection and resection of retrocecal appendix.    Family History  Problem Relation Age of Onset  . Cancer Neg Hx   . Hypertension Other     No Known Allergies  Current Outpatient Prescriptions on File Prior to Visit  Medication Sig Dispense Refill  . chlorzoxazone (PARAFON) 500 MG tablet Take 500 mg by mouth as needed.        . eletriptan (RELPAX) 40 MG tablet One tablet by mouth as needed for migraine headache.  If the headache improves and then returns, dose may be repeated after 2 hours have elapsed since first dose (do not exceed 80 mg per day). may repeat in 2  hours if necessary       . hydrochlorothiazide 25 MG tablet Take 1 tablet (25 mg total) by mouth daily.  30 tablet  2  . ketoprofen (ORUDIS) 75 MG capsule Take 1 tablet as needed for headaches.       Marland Kitchen lisinopril (PRINIVIL,ZESTRIL) 20 MG tablet Take 1 tablet (20 mg total) by mouth daily.  30 tablet  5  . metaxalone (SKELAXIN) 800 MG tablet Take 800 mg by mouth 3 (three) times daily as needed.       . nebivolol (BYSTOLIC) 5 MG tablet Take 1 tablet (5 mg total) by mouth daily.  30 tablet  3  . ondansetron (ZOFRAN) 4 MG tablet Take 4 mg by mouth 3 (three) times daily as needed. For nausea       . oxyCODONE-acetaminophen (PERCOCET) 5-325 MG per tablet Take 1 tablet by mouth daily as needed. For pain.         BP 100/76  Pulse 60  Temp(Src) 98.4 F (36.9 C) (Oral)  Resp 16  Wt 151 lb 1.3 oz (68.529 kg)  LMP 11/10/2005    Objective:   Physical Exam  Constitutional: She appears well-developed and well-nourished.  Uncomfortable appearing female.   HENT:  Head: Atraumatic.  Cardiovascular: Normal rate and regular rhythm.   No murmur heard. Pulmonary/Chest: Effort normal and breath sounds normal. No respiratory distress. She has no wheezes. She has no rales.  Musculoskeletal:       + tenderness to palpation of the right flank area. Bilateral LE strength is 4-5/5.  (effort did not seem to be full due to pain).  Able to toe walk and heel walk with some difficulty on the right.  Noted increase pain with these maneuvers.           Assessment & Plan:

## 2010-11-14 ENCOUNTER — Telehealth: Payer: Self-pay | Admitting: Family

## 2010-11-14 NOTE — Telephone Encounter (Signed)
Pls call pt and advise her not to take skelaxin and flexeril together as they are both muscle relaxants.

## 2010-11-14 NOTE — Assessment & Plan Note (Signed)
Will treat with medrol dose pak and Flexeril prn. She was advised not to drive after taking flexeril.  Pt was given note for work this weekend as she is unable to stand for a prolonged period of time. She will contact us if her symptoms worsen.

## 2010-11-15 NOTE — Telephone Encounter (Signed)
Pt.notified

## 2010-12-01 ENCOUNTER — Ambulatory Visit: Payer: Managed Care, Other (non HMO) | Admitting: Family

## 2010-12-08 ENCOUNTER — Ambulatory Visit: Payer: Managed Care, Other (non HMO) | Admitting: Family

## 2012-04-13 ENCOUNTER — Ambulatory Visit: Payer: Managed Care, Other (non HMO) | Admitting: Family

## 2012-04-18 ENCOUNTER — Encounter: Payer: Self-pay | Admitting: Family

## 2012-04-18 ENCOUNTER — Ambulatory Visit (INDEPENDENT_AMBULATORY_CARE_PROVIDER_SITE_OTHER): Payer: Managed Care, Other (non HMO) | Admitting: Family

## 2012-04-18 VITALS — BP 122/88 | HR 59 | Temp 98.5°F | Resp 16 | Ht 63.0 in | Wt 163.0 lb

## 2012-04-18 DIAGNOSIS — D239 Other benign neoplasm of skin, unspecified: Secondary | ICD-10-CM

## 2012-04-18 DIAGNOSIS — D229 Melanocytic nevi, unspecified: Secondary | ICD-10-CM | POA: Insufficient documentation

## 2012-04-18 DIAGNOSIS — I10 Essential (primary) hypertension: Secondary | ICD-10-CM

## 2012-04-18 LAB — BASIC METABOLIC PANEL
BUN: 13 mg/dL (ref 6–23)
CO2: 28 mEq/L (ref 19–32)
Calcium: 9.9 mg/dL (ref 8.4–10.5)
Chloride: 101 mEq/L (ref 96–112)
Creat: 0.8 mg/dL (ref 0.50–1.10)
Glucose, Bld: 83 mg/dL (ref 70–99)
Potassium: 3.9 mEq/L (ref 3.5–5.3)
Sodium: 138 mEq/L (ref 135–145)

## 2012-04-18 NOTE — Assessment & Plan Note (Signed)
Stable on lisinopril, hctz and bystolic. Obtain BMET, continue current meds.

## 2012-04-18 NOTE — Progress Notes (Signed)
Subjective:    Patient ID: [name], female    DOB: 1965/03/13, 47 y.o.   MRN: [id number]  HPI  [name] is a 47 yr old female who presents today for follow up.  1) HTN-  She is continued on lisinopril. Denies cough, cp, sob or swelling.   2) Nevus- notes that she has a mole on her left shoulder which becomes very irritated by clothing.  Review of Systems See HPI  Past Medical History  Diagnosis Date  . Hypertension   . Headache     Chronic migraines--Dr Lewitt  . Meckel's diverticulum 08/2004    with ischemia  . MWUXL(K4401/0) 11/2005    right ovary  . Uterine adhesions     History   Social History  . Marital Status: Single    Spouse Name: N/A    Number of Children: 3  . Years of Education: N/A   Occupational History  . Cashier    Social History Main Topics  . Smoking status: Never Smoker   . Smokeless tobacco: Never Used  . Alcohol Use: Yes     Comment: social  . Drug Use: Not on file  . Sexually Active: Not on file   Other Topics Concern  . Not on file   Social History Narrative  . No narrative on file    Past Surgical History  Procedure Laterality Date  . Laparoscopy  11/2005    total laparoscopy  . Laparoscopic salpingoopherectomy  11/2005    right-- assisted with daVinci robot, and lysis of omental adhesions.  . Abdominal hysterectomy  11/2005  . Other surgical history      Meckel's diverticulectomy, small bowel resection and resection of retrocecal appendix.    Family History  Problem Relation Age of Onset  . Cancer Neg Hx   . Hypertension Other     No Known Allergies  Current Outpatient Prescriptions on File Prior to Visit  Medication Sig Dispense Refill  . chlorzoxazone (PARAFON) 500 MG tablet Take 500 mg by mouth as needed.        . eletriptan (RELPAX) 40 MG tablet One tablet by mouth as needed for migraine headache.  If the headache improves and then returns, dose may be repeated after 2 hours have elapsed since first dose (do  not exceed 80 mg per day). may repeat in 2 hours if necessary       . hydrochlorothiazide 25 MG tablet Take 1 tablet (25 mg total) by mouth daily.  30 tablet  2  . ketoprofen (ORUDIS) 75 MG capsule Take 1 tablet as needed for headaches.       Marland Kitchen lisinopril (PRINIVIL,ZESTRIL) 20 MG tablet Take 1 tablet (20 mg total) by mouth daily.  30 tablet  5  . metaxalone (SKELAXIN) 800 MG tablet Take 800 mg by mouth 3 (three) times daily as needed.       . nebivolol (BYSTOLIC) 5 MG tablet Take 1 tablet (5 mg total) by mouth daily.  30 tablet  3  . ondansetron (ZOFRAN) 4 MG tablet Take 4 mg by mouth 3 (three) times daily as needed. For nausea       . oxyCODONE-acetaminophen (PERCOCET) 5-325 MG per tablet Take 1 tablet by mouth daily as needed. For pain.        No current facility-administered medications on file prior to visit.    BP 122/88  Pulse 59  Temp(Src) 98.5 F (36.9 C) (Oral)  Resp 16  Ht 5\' 3"  (1.6 m)  Wt 163 lb (73.936 kg)  BMI 28.88 kg/m2  SpO2 99%  LMP 11/10/2005       Objective:   Physical Exam  Constitutional: She is oriented to person, place, and time. She appears well-developed and well-nourished. No distress.  HENT:  Head: Normocephalic and atraumatic.  Cardiovascular: Normal rate and regular rhythm.   No murmur heard. Pulmonary/Chest: Breath sounds normal. No respiratory distress. She has no wheezes. She has no rales. She exhibits no tenderness.  Musculoskeletal: She exhibits no edema.  Lymphadenopathy:    She has no cervical adenopathy.  Neurological: She is alert and oriented to person, place, and time.  Skin: Skin is dry.  Pedunculated lesion noted left anterior shoulder  Psychiatric: She has a normal mood and affect. Her behavior is normal. Judgment and thought content normal.          Assessment & Plan:

## 2012-04-18 NOTE — Assessment & Plan Note (Signed)
Pt expressed desire to remove nevus. Procedure including risks/benefits explained to patient.  Questions were answered. After informed consent was obtained, site was cleansed with betadine and then alcohol. 1% Lidocaine with epinephrine was injected under lesion and then lesion was removed with a scalpel.. Area was cauterized to obtain hemostasis.  Pt tolerated procedure well.  Specimen sent for pathology review.  Pt instructed to keep the area dry for 24 hours and to contact us if he develops redness, drainage or swelling at the site.  Pt may use tylenol as needed for discomfort today.

## 2012-04-18 NOTE — Patient Instructions (Addendum)
Please complete lab work prior to leaving. Keep area dry x 24 hours.  Call if you develop redness/swelling or drainage from the area.

## 2012-04-19 ENCOUNTER — Telehealth: Payer: Self-pay | Admitting: Family

## 2012-04-19 MED ORDER — HYDROCHLOROTHIAZIDE 25 MG PO TABS: 25.0000 mg | ORAL_TABLET | Freq: Every day | ORAL | Status: DC

## 2012-04-19 MED ORDER — LISINOPRIL 20 MG PO TABS: 20.0000 mg | ORAL_TABLET | Freq: Every day | ORAL | Status: DC

## 2012-04-19 MED ORDER — NEBIVOLOL HCL 5 MG PO TABS: 5.0000 mg | ORAL_TABLET | Freq: Every day | ORAL | Status: DC

## 2012-04-19 NOTE — Telephone Encounter (Signed)
Patient was seen yesterday and needed medication refilled. She would like refills on bystolic, lisinopril, and hydrochlorothiazide to be sent to [facility] pharmacy. Patient states that she works at [employer] and would like refills sent before 1pm. That is when she leaves.

## 2012-04-19 NOTE — Telephone Encounter (Signed)
Rx request to pharmacy/SLS  

## 2012-04-20 ENCOUNTER — Encounter: Payer: Self-pay | Admitting: Family

## 2012-06-21 ENCOUNTER — Ambulatory Visit (INDEPENDENT_AMBULATORY_CARE_PROVIDER_SITE_OTHER): Payer: Managed Care, Other (non HMO) | Admitting: Family Medicine

## 2012-06-21 ENCOUNTER — Encounter: Payer: Self-pay | Admitting: Family Medicine

## 2012-06-21 VITALS — BP 124/72 | HR 79 | Temp 98.5°F | Resp 16 | Wt 161.1 lb

## 2012-06-21 DIAGNOSIS — I10 Essential (primary) hypertension: Secondary | ICD-10-CM

## 2012-06-21 DIAGNOSIS — M533 Sacrococcygeal disorders, not elsewhere classified: Secondary | ICD-10-CM

## 2012-06-21 MED ORDER — METHOCARBAMOL 500 MG PO TABS: 500.0000 mg | ORAL_TABLET | Freq: Two times a day (BID) | ORAL | Status: DC | PRN

## 2012-06-21 MED ORDER — CYCLOBENZAPRINE HCL 10 MG PO TABS: 10.0000 mg | ORAL_TABLET | Freq: Every evening | ORAL | Status: DC | PRN

## 2012-06-21 MED ORDER — NAPROXEN 500 MG PO TABS: 500.0000 mg | ORAL_TABLET | Freq: Two times a day (BID) | ORAL | Status: DC | PRN

## 2012-06-21 NOTE — Patient Instructions (Addendum)
Sacroiliac Joint Dysfunction  The sacroiliac joint connects the lower part of the spine (the sacrum) with the bones of the pelvis.  CAUSES   Sometimes, there is no obvious reason for sacroiliac joint dysfunction. Other times, it may occur    During pregnancy.   After injury, such as:   Car accidents.   Sport-related injuries.   Work-related injuries.   Due to one leg being shorter than the other.   Due to other conditions that affect the joints, such as:   Rheumatoid arthritis.   Gout.   Psoriasis.   Joint infection (septic arthritis).  SYMPTOMS   Symptoms may include:   Pain in the:   Lower back.   Buttocks.   Groin.   Thighs and legs.   Difficult sitting, standing, walking, lying, bending or lifting.  DIAGNOSIS   A number of tests may be used to help diagnose the cause of sacroiliac joint dysfunction, including:   Imaging tests to look for other causes of pain, including:   MRI.   CT scan.   Bone scan.   Diagnostic injection: During a special x-ray (called fluoroscopy), a needle is put into the sacroiliac joint. A numbing medicine is injected into the joint. If the pain is improved or stopped, the diagnosis of sacroiliac joint dysfunction is more likely.  TREATMENT   There are a number of types of treatment used for sacroiliac joint dysfunction, including:   Only take over-the-counter or prescription medicines for pain, discomfort, or fever as directed by your caregiver.   Medications to relax muscles.   Rest. Decreasing activity can help cut down on painful muscle spasms and allow the back to heal.   Application of heat or ice to the lower back may improve muscle spasms and soothe pain.   Brace. A special back brace, called a sacroiliac belt, can help support the joint while your back is healing.   Physical therapy can help teach comfortable positions and exercises to strengthen muscles that support the sacroiliac joint.   Cortisone injections. Injections of steroid medicine into the  joint can help decrease swelling and improve pain.   Hyaluronic acid injections. This chemical improves lubrication within the sacroiliac joint, thereby decreasing pain.   Radiofrequency ablation. A special needle is placed into the joint, where it burns away nerves that are carrying pain messages from the joint.   Surgery. Because pain occurs during movement of the joint, screws and plates may be installed in order to limit or prevent joint motion.  HOME CARE INSTRUCTIONS    Take all medications exactly as directed.   Follow instructions regarding both rest and physical activity, to avoid worsening the pain.   Do physical therapy exercises exactly as prescribed.  SEEK IMMEDIATE MEDICAL CARE IF:   You experience increasingly severe pain.   You develop new symptoms, such as numbness or tingling in your legs or feet.   You lose bladder or bowel control.  Document Released: 03/25/2008 Document Revised: 03/21/2011 Document Reviewed: 03/25/2008  [employer] Patient Information 2014 [employer].

## 2012-06-23 ENCOUNTER — Encounter: Payer: Self-pay | Admitting: Family Medicine

## 2012-06-23 DIAGNOSIS — M533 Sacrococcygeal disorders, not elsewhere classified: Secondary | ICD-10-CM | POA: Insufficient documentation

## 2012-06-23 HISTORY — DX: Sacrococcygeal disorders, not elsewhere classified: M53.3

## 2012-06-23 NOTE — Progress Notes (Signed)
Patient ID: [name], female   DOB: 23-Apr-1965, 47 y.o.   MRN: [id number] [name] [id number] Mar 25, 1965 06/23/2012      Progress Note-Follow Up  Subjective  Chief Complaint  Chief Complaint  Patient presents with  . Hip Pain    Pt reports right hip pain since Sunday. Worsened on Tuesday, unable to work today.    HPI  Patient is a 47 year old Caucasian female with a history of intermittent back and hip pain. She's been having pain in her right posterior hip. It hurts constantly with certain movements hurts much worse. There is some intermittent radicular symptoms slightly into her right leg as well. No falls or injury. No GI or GU complaints such as incontinence. No fevers or chills. No chest pain, palpitations, shortness of breath  Past Medical History  Diagnosis Date  . Hypertension   . Headache(784.0)     Chronic migraines--Dr Lewitt  . Meckel's diverticulum 08/2004    with ischemia  . 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 Uterine adhesions   . Sacroiliac dysfunction 06/23/2012    Past Surgical History  Procedure Laterality Date  . Laparoscopy  11/2005    total laparoscopy  . Laparoscopic salpingoopherectomy  11/2005    right-- assisted with daVinci robot, and lysis of omental adhesions.  . Abdominal hysterectomy  11/2005  . Other surgical history      Meckel's diverticulectomy, small bowel resection and resection of retrocecal appendix.    Family History  Problem Relation Age of Onset  . Cancer Neg Hx   . Hypertension Other     History   Social History  . Marital Status: Single    Spouse Name: N/A    Number of Children: 3  . Years of Education: N/A   Occupational History  . Cashier    Social History Main Topics  . Smoking status: Never Smoker   . Smokeless tobacco: Never Used  . Alcohol Use: Yes     Comment: social  . Drug Use: Not on file  . Sexually Active: Not on file   Other Topics Concern  . Not on file   Social History Narrative  . No  narrative on file    Current Outpatient Prescriptions on File Prior to Visit  Medication Sig Dispense Refill  . chlorzoxazone (PARAFON) 500 MG tablet Take 500 mg by mouth as needed.        . eletriptan (RELPAX) 40 MG tablet One tablet by mouth as needed for migraine headache.  If the headache improves and then returns, dose may be repeated after 2 hours have elapsed since first dose (do not exceed 80 mg per day). may repeat in 2 hours if necessary       . hydrochlorothiazide (HYDRODIURIL) 25 MG tablet Take 1 tablet (25 mg total) by mouth daily.  30 tablet  2  . ketoprofen (ORUDIS) 75 MG capsule Take 1 tablet as needed for headaches.       Marland Kitchen lisinopril (PRINIVIL,ZESTRIL) 20 MG tablet Take 1 tablet (20 mg total) by mouth daily.  30 tablet  2  . metaxalone (SKELAXIN) 800 MG tablet Take 800 mg by mouth 3 (three) times daily as needed.       . nebivolol (BYSTOLIC) 5 MG tablet Take 1 tablet (5 mg total) by mouth daily.  30 tablet  2  . ondansetron (ZOFRAN) 4 MG tablet Take 4 mg by mouth 3 (three) times daily as needed. For nausea       .  oxyCODONE-acetaminophen (PERCOCET) 5-325 MG per tablet Take 1 tablet by mouth daily as needed. For pain.        No current facility-administered medications on file prior to visit.    No Known Allergies  Review of Systems  Review of Systems  Constitutional: Negative for fever and malaise/fatigue.  HENT: Negative for congestion.   Eyes: Negative for discharge.  Respiratory: Negative for shortness of breath.   Cardiovascular: Negative for chest pain, palpitations and leg swelling.  Gastrointestinal: Negative for nausea, abdominal pain and diarrhea.  Genitourinary: Negative for dysuria.  Musculoskeletal: Positive for joint pain. Negative for falls.       Right hip  Skin: Negative for rash.  Neurological: Negative for loss of consciousness and headaches.  Endo/Heme/Allergies: Negative for polydipsia.  Psychiatric/Behavioral: Negative for depression and  suicidal ideas. The patient is not nervous/anxious and does not have insomnia.     Objective  BP 124/72  Pulse 79  Temp(Src) 98.5 F (36.9 C) (Oral)  Resp 16  Wt 161 lb 1.3 oz (73.065 kg)  BMI 28.54 kg/m2  SpO2 98%  LMP 11/10/2005  Physical Exam  Physical Exam  Constitutional: She is oriented to person, place, and time and well-developed, well-nourished, and in no distress. No distress.  HENT:  Head: Normocephalic and atraumatic.  Eyes: Conjunctivae are normal.  Neck: Neck supple. No thyromegaly present.  Cardiovascular: Normal rate, regular rhythm and normal heart sounds.   No murmur heard. Pulmonary/Chest: Effort normal and breath sounds normal. She has no wheezes.  Abdominal: She exhibits no distension and no mass.  Musculoskeletal: She exhibits no edema.  Pain with palpation over right posterior hip  Lymphadenopathy:    She has no cervical adenopathy.  Neurological: She is alert and oriented to person, place, and time.  Skin: Skin is warm and dry. No rash noted. She is not diaphoretic.  Psychiatric: Memory, affect and judgment normal.    Lab Results  Component Value Date   TSH 1.14 05/05/2008   Lab Results  Component Value Date   WBC 4.0* 05/05/2008   HGB 13.6 05/05/2008   HCT 39.1 05/05/2008   MCV 86.6 05/05/2008   PLT 218.0 05/05/2008   Lab Results  Component Value Date   CREATININE 0.80 04/18/2012   BUN 13 04/18/2012   NA 138 04/18/2012   K 3.9 04/18/2012   CL 101 04/18/2012   CO2 28 04/18/2012   Lab Results  Component Value Date   ALT 11 05/05/2008   AST 15 05/05/2008   ALKPHOS 65 05/05/2008   BILITOT 0.9 05/05/2008   Lab Results  Component Value Date   CHOL 172 05/05/2008   Lab Results  Component Value Date   HDL 37.40* 05/05/2008   Lab Results  Component Value Date   LDLCALC 121* 05/05/2008   Lab Results  Component Value Date   TRIG 66.0 05/05/2008   Lab Results  Component Value Date   CHOLHDL 5 05/05/2008     Assessment & Plan  Sacroiliac  dysfunction Moist heat, stretch. Salon pas, Flexeril each bedtime and Robaxin twice a day as well as naproxen twice a day  HYPERTENSION Well-controlled no changes

## 2012-06-23 NOTE — Assessment & Plan Note (Signed)
Moist heat, stretch. Salon pas, Flexeril each bedtime and Robaxin twice a day as well as naproxen twice a day

## 2012-06-23 NOTE — Assessment & Plan Note (Signed)
Well controlled no changes 

## 2012-07-18 ENCOUNTER — Ambulatory Visit: Payer: Managed Care, Other (non HMO) | Admitting: Family

## 2012-07-25 ENCOUNTER — Ambulatory Visit: Payer: Managed Care, Other (non HMO) | Admitting: Family

## 2012-08-03 ENCOUNTER — Ambulatory Visit: Payer: Managed Care, Other (non HMO) | Admitting: Family

## 2012-08-30 ENCOUNTER — Other Ambulatory Visit: Payer: Self-pay | Admitting: *Deleted

## 2012-08-30 ENCOUNTER — Other Ambulatory Visit: Payer: Self-pay | Admitting: Family

## 2012-08-30 MED ORDER — HYDROCHLOROTHIAZIDE 25 MG PO TABS: 25.0000 mg | ORAL_TABLET | Freq: Every day | ORAL | Status: DC

## 2012-08-30 NOTE — Telephone Encounter (Signed)
Rx request to pharmacy/SLS  

## 2012-09-01 ENCOUNTER — Other Ambulatory Visit: Payer: Self-pay | Admitting: Family

## 2013-02-22 ENCOUNTER — Other Ambulatory Visit: Payer: Self-pay | Admitting: Family

## 2013-02-22 NOTE — Telephone Encounter (Signed)
Informed patient of medication refill and she scheduled appointment for 03/06/13 °

## 2013-02-22 NOTE — Telephone Encounter (Signed)
Please call and inform patient that we sent in a 30 day supply of the 2 medications requested but will need a f/up for addt refills

## 2013-03-06 ENCOUNTER — Ambulatory Visit (INDEPENDENT_AMBULATORY_CARE_PROVIDER_SITE_OTHER): Payer: Managed Care, Other (non HMO) | Admitting: Physician Assistant

## 2013-03-06 ENCOUNTER — Ambulatory Visit: Payer: Managed Care, Other (non HMO) | Admitting: Family

## 2013-03-06 ENCOUNTER — Encounter: Payer: Self-pay | Admitting: Physician Assistant

## 2013-03-06 VITALS — BP 128/88 | HR 56 | Temp 98.2°F | Resp 16 | Ht 63.0 in | Wt 168.5 lb

## 2013-03-06 DIAGNOSIS — I10 Essential (primary) hypertension: Secondary | ICD-10-CM

## 2013-03-06 LAB — BASIC METABOLIC PANEL
BUN: 15 mg/dL (ref 6–23)
CO2: 29 mEq/L (ref 19–32)
Calcium: 9.4 mg/dL (ref 8.4–10.5)
Chloride: 102 mEq/L (ref 96–112)
Creat: 0.78 mg/dL (ref 0.50–1.10)
Glucose, Bld: 82 mg/dL (ref 70–99)
Potassium: 3.6 mEq/L (ref 3.5–5.3)
Sodium: 137 mEq/L (ref 135–145)

## 2013-03-06 MED ORDER — HYDROCHLOROTHIAZIDE 25 MG PO TABS: 25.0000 mg | ORAL_TABLET | Freq: Every day | ORAL | Status: DC

## 2013-03-06 MED ORDER — NEBIVOLOL HCL 5 MG PO TABS: 5.0000 mg | ORAL_TABLET | Freq: Every day | ORAL | Status: DC

## 2013-03-06 MED ORDER — LISINOPRIL 20 MG PO TABS: 20.0000 mg | ORAL_TABLET | Freq: Every day | ORAL | Status: DC

## 2013-03-06 NOTE — Assessment & Plan Note (Signed)
Will refill medications.  Discussed increased urination is likely due to HCTZ therapy.  Will attempt trial of 12.5 mg HCTZ instead of 25 mg HCTZ.  Patient to check BP daily and record results.  Will check BMP today. Follow-up in 2 weeks.

## 2013-03-06 NOTE — Progress Notes (Signed)
Pre visit review using our clinic review tool, if applicable. No additional management support is needed unless otherwise documented below in the visit note/SLS  

## 2013-03-06 NOTE — Progress Notes (Signed)
Patient presents to clinic today for 40-month follow-up of Hypertension and for medication refill.  Patient endorses taking medications as prescribed.  Denies headache, vision change, chest pain, palpitations, shortness of breath, lightheadedness or dizziness.  Denies chronic cough with ACEI therapy.  Denies history of MI or CVA.  Denies history of hyperlipidemia.  Patient does endorse some increased urination around 1 hour after she takes medication.  Denies dysuria, urinary urgency, hematuria, foul-smelling urine, nausea or vomiting. Discussed this is most likely due to HCTZ.  Past Medical History  Diagnosis Date  . Hypertension   . Headache(784.0)     Chronic migraines--Dr Lewitt  . Meckel's diverticulum 08/2004    with ischemia  . 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 Uterine adhesions   . Sacroiliac dysfunction 06/23/2012    Current Outpatient Prescriptions on File Prior to Visit  Medication Sig Dispense Refill  . chlorzoxazone (PARAFON) 500 MG tablet Take 500 mg by mouth as needed.        . cyclobenzaprine (FLEXERIL) 10 MG tablet Take 1 tablet (10 mg total) by mouth at bedtime as needed for muscle spasms.  30 tablet  1  . eletriptan (RELPAX) 40 MG tablet One tablet by mouth as needed for migraine headache.  If the headache improves and then returns, dose may be repeated after 2 hours have elapsed since first dose (do not exceed 80 mg per day). may repeat in 2 hours if necessary       . ketoprofen (ORUDIS) 75 MG capsule Take 1 tablet as needed for headaches.       . metaxalone (SKELAXIN) 800 MG tablet Take 800 mg by mouth 3 (three) times daily as needed.       . methocarbamol (ROBAXIN) 500 MG tablet Take 1 tablet (500 mg total) by mouth 2 (two) times daily as needed.  40 tablet  1  . naproxen (NAPROSYN) 500 MG tablet Take 1 tablet (500 mg total) by mouth 2 (two) times daily as needed. For pain with food  60 tablet  1  . ondansetron (ZOFRAN) 4 MG tablet Take 4 mg by mouth 3 (three) times daily  as needed. For nausea       . oxyCODONE-acetaminophen (PERCOCET) 5-325 MG per tablet Take 1 tablet by mouth daily as needed. For pain.        No current facility-administered medications on file prior to visit.    No Known Allergies  Family History  Problem Relation Age of Onset  . Cancer Neg Hx   . Hypertension Other     History   Social History  . Marital Status: Single    Spouse Name: N/A    Number of Children: 3  . Years of Education: N/A   Occupational History  . Cashier    Social History Main Topics  . Smoking status: Never Smoker   . Smokeless tobacco: Never Used  . Alcohol Use: Yes     Comment: social  . Drug Use: None  . Sexual Activity: None   Other Topics Concern  . None   Social History Narrative  . None    Review of Systems - See HPI.  All other ROS are negative.  BP 128/88  Pulse 56  Temp(Src) 98.2 F (36.8 C) (Oral)  Resp 16  Ht 5\' 3"  (1.6 m)  Wt 168 lb 8 oz (76.431 kg)  BMI 29.86 kg/m2  SpO2 98%  LMP 11/10/2005  Physical Exam  Vitals reviewed. Constitutional: She is oriented  to person, place, and time and well-developed, well-nourished, and in no distress.  HENT:  Head: Normocephalic and atraumatic.  Eyes: Conjunctivae are normal. Pupils are equal, round, and reactive to light.  Neck: Neck supple.  Cardiovascular: Normal rate, regular rhythm, normal heart sounds and intact distal pulses.   Pulmonary/Chest: Effort normal and breath sounds normal. No respiratory distress. She has no wheezes. She has no rales. She exhibits no tenderness.  Lymphadenopathy:    She has no cervical adenopathy.  Neurological: She is alert and oriented to person, place, and time.  Skin: Skin is warm and dry. No rash noted.  Psychiatric: Affect normal.    No results found for this or any previous visit (from the past 2160 hour(s)).  Assessment/Plan: HYPERTENSION Will refill medications.  Discussed increased urination is likely due to HCTZ therapy.  Will  attempt trial of 12.5 mg HCTZ instead of 25 mg HCTZ.  Patient to check BP daily and record results.  Will check BMP today. Follow-up in 2 weeks.

## 2013-03-06 NOTE — Patient Instructions (Signed)
Attempt to take 1/2 tablet of the hydrochlorothiazide daily to see if this help with urinary frequency.  I will call you with your lab results.  Continue other medications as prescribed.  Monitor blood pressure daily.  Write down your results. Return to clinic for follow-up and blood pressure recheck in 2 weeks.

## 2013-03-08 ENCOUNTER — Telehealth: Payer: Self-pay | Admitting: Family

## 2013-03-08 NOTE — Telephone Encounter (Signed)
Relevant patient education assigned to patient using Emmi. ° °

## 2013-03-20 ENCOUNTER — Encounter: Payer: Self-pay | Admitting: Physician Assistant

## 2013-03-20 ENCOUNTER — Ambulatory Visit (INDEPENDENT_AMBULATORY_CARE_PROVIDER_SITE_OTHER): Payer: Managed Care, Other (non HMO) | Admitting: Physician Assistant

## 2013-03-20 VITALS — BP 108/78 | HR 68 | Temp 98.5°F | Resp 16 | Ht 63.0 in | Wt 166.0 lb

## 2013-03-20 DIAGNOSIS — I10 Essential (primary) hypertension: Secondary | ICD-10-CM

## 2013-03-20 NOTE — Progress Notes (Signed)
Patient presents to clinic today for 2-week follow-up of BP and urinary frequency. Patient has stopped caffeine intake as directed at last visit.  Has notices a marked improvement in her urination.  Is not going to the bathroom nearly as much.  Is still taking 25 mg of HCTZ.  Now feels without caffeine she can tolerate the 25 mg HCTZ as she is not going to the bathroom as often.  Denies chest pain, palpitations, LH, dizziness, blurry vision or headache.  BP normotensive in clinic today.  Past Medical History  Diagnosis Date  . Hypertension   . Headache(784.0)     Chronic migraines--Dr Lewitt  . Meckel's diverticulum 08/2004    with ischemia  . 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 Uterine adhesions   . Sacroiliac dysfunction 06/23/2012    Current Outpatient Prescriptions on File Prior to Visit  Medication Sig Dispense Refill  . chlorzoxazone (PARAFON) 500 MG tablet Take 500 mg by mouth as needed.        . cyclobenzaprine (FLEXERIL) 10 MG tablet Take 1 tablet (10 mg total) by mouth at bedtime as needed for muscle spasms.  30 tablet  1  . eletriptan (RELPAX) 40 MG tablet One tablet by mouth as needed for migraine headache.  If the headache improves and then returns, dose may be repeated after 2 hours have elapsed since first dose (do not exceed 80 mg per day). may repeat in 2 hours if necessary       . hydrochlorothiazide (HYDRODIURIL) 25 MG tablet Take 1 tablet (25 mg total) by mouth daily.  30 tablet  3  . ketoprofen (ORUDIS) 75 MG capsule Take 1 tablet as needed for headaches.       Marland Kitchen lisinopril (PRINIVIL,ZESTRIL) 20 MG tablet Take 1 tablet (20 mg total) by mouth daily.  30 tablet  0  . metaxalone (SKELAXIN) 800 MG tablet Take 800 mg by mouth 3 (three) times daily as needed.       . methocarbamol (ROBAXIN) 500 MG tablet Take 1 tablet (500 mg total) by mouth 2 (two) times daily as needed.  40 tablet  1  . naproxen (NAPROSYN) 500 MG tablet Take 1 tablet (500 mg total) by mouth 2 (two) times daily as  needed. For pain with food  60 tablet  1  . nebivolol (BYSTOLIC) 5 MG tablet Take 1 tablet (5 mg total) by mouth daily.  30 tablet  2  . ondansetron (ZOFRAN) 4 MG tablet Take 4 mg by mouth 3 (three) times daily as needed. For nausea       . oxyCODONE-acetaminophen (PERCOCET) 5-325 MG per tablet Take 1 tablet by mouth daily as needed. For pain.        No current facility-administered medications on file prior to visit.    No Known Allergies  Family History  Problem Relation Age of Onset  . Cancer Neg Hx   . Hypertension Other     History   Social History  . Marital Status: Single    Spouse Name: N/A    Number of Children: 3  . Years of Education: N/A   Occupational History  . Cashier    Social History Main Topics  . Smoking status: Never Smoker   . Smokeless tobacco: Never Used  . Alcohol Use: Yes     Comment: social  . Drug Use: None  . Sexual Activity: None   Other Topics Concern  . None   Social History Narrative  . None  Review of Systems - See HPI.  All other ROS are negative.  BP 108/78  Pulse 68  Temp(Src) 98.5 F (36.9 C) (Oral)  Resp 16  Ht 5\' 3"  (1.6 m)  Wt 166 lb (75.297 kg)  BMI 29.41 kg/m2  SpO2 98%  LMP 11/10/2005  Physical Exam  Vitals reviewed. Constitutional: She is oriented to person, place, and time and well-developed, well-nourished, and in no distress.  HENT:  Head: Normocephalic and atraumatic.  Eyes: Conjunctivae are normal. Pupils are equal, round, and reactive to light.  Neck: Neck supple.  Cardiovascular: Normal rate, regular rhythm, normal heart sounds and intact distal pulses.   Pulmonary/Chest: Effort normal and breath sounds normal. No respiratory distress. She has no wheezes. She has no rales. She exhibits no tenderness.  Neurological: She is alert and oriented to person, place, and time.  Skin: Skin is warm and dry. No rash noted.  Psychiatric: Affect normal.    Recent Results (from the past 2160 hour(s))  BASIC  METABOLIC PANEL     Status: None   Collection Time    03/06/13 10:27 AM      Result Value Ref Range   Sodium 137  135 - 145 mEq/L   Potassium 3.6  3.5 - 5.3 mEq/L   Chloride 102  96 - 112 mEq/L   CO2 29  19 - 32 mEq/L   Glucose, Bld 82  70 - 99 mg/dL   BUN 15  6 - 23 mg/dL   Creat 0.78  0.50 - 1.10 mg/dL   Calcium 9.4  8.4 - 10.5 mg/dL    Assessment/Plan: HYPERTENSION Will keep patient at 25 mg HCTZ.  Frequent urination has been resolved with removal of coffee from patient's diet.  Follow-up with PCP as directed.

## 2013-03-20 NOTE — Assessment & Plan Note (Signed)
Will keep patient at 25 mg HCTZ.  Frequent urination has been resolved with removal of coffee from patient's diet.  Follow-up with PCP as directed.

## 2013-03-20 NOTE — Patient Instructions (Signed)
Continue medications as prescribed.  Continue to Monitor your blood pressure at home.  Please make [place] aware if your BP drops below 90/70.  I am glad your urine frequency has improved now that you have stopped consumption of your coffee.  Hypertension As your heart beats, it forces blood through your arteries. This force is your blood pressure. If the pressure is too high, it is called hypertension (HTN) or high blood pressure. HTN is dangerous because you may have it and not know it. High blood pressure may mean that your heart has to work harder to pump blood. Your arteries may be narrow or stiff. The extra work puts you at risk for heart disease, stroke, and other problems.  Blood pressure consists of two numbers, a higher number over a lower, 110/72, for example. It is stated as "110 over 72." The ideal is below 120 for the top number (systolic) and under 80 for the bottom (diastolic). Write down your blood pressure today. You should pay close attention to your blood pressure if you have certain conditions such as:  Heart failure.  Prior heart attack.  Diabetes  Chronic kidney disease.  Prior stroke.  Multiple risk factors for heart disease. To see if you have HTN, your blood pressure should be measured while you are seated with your arm held at the level of the heart. It should be measured at least twice. A one-time elevated blood pressure reading (especially in the Emergency Department) does not mean that you need treatment. There may be conditions in which the blood pressure is different between your right and left arms. It is important to see your caregiver soon for a recheck. Most people have essential hypertension which means that there is not a specific cause. This type of high blood pressure may be lowered by changing lifestyle factors such as:  Stress.  Smoking.  Lack of exercise.  Excessive weight.  Drug/tobacco/alcohol use.  Eating less salt. Most people do not have  symptoms from high blood pressure until it has caused damage to the body. Effective treatment can often prevent, delay or reduce that damage. TREATMENT  When a cause has been identified, treatment for high blood pressure is directed at the cause. There are a large number of medications to treat HTN. These fall into several categories, and your caregiver will help you select the medicines that are best for you. Medications may have side effects. You should review side effects with your caregiver. If your blood pressure stays high after you have made lifestyle changes or started on medicines,   Your medication(s) may need to be changed.  Other problems may need to be addressed.  Be certain you understand your prescriptions, and know how and when to take your medicine.  Be sure to follow up with your caregiver within the time frame advised (usually within two weeks) to have your blood pressure rechecked and to review your medications.  If you are taking more than one medicine to lower your blood pressure, make sure you know how and at what times they should be taken. Taking two medicines at the same time can result in blood pressure that is too low. SEEK IMMEDIATE MEDICAL CARE IF:  You develop a severe headache, blurred or changing vision, or confusion.  You have unusual weakness or numbness, or a faint feeling.  You have severe chest or abdominal pain, vomiting, or breathing problems. MAKE SURE YOU:   Understand these instructions.  Will watch your condition.  Will get  help right away if you are not doing well or get worse. Document Released: 12/27/2004 Document Revised: 03/21/2011 Document Reviewed: 08/17/2007 [facility] Patient Information 2014 [place].

## 2013-03-20 NOTE — Progress Notes (Signed)
Pre visit review using our clinic review tool, if applicable. No additional management support is needed unless otherwise documented below in the visit note/SLS  

## 2013-05-13 ENCOUNTER — Other Ambulatory Visit: Payer: Self-pay | Admitting: Family

## 2013-05-14 ENCOUNTER — Telehealth: Payer: Self-pay | Admitting: *Deleted

## 2013-05-14 DIAGNOSIS — I10 Essential (primary) hypertension: Secondary | ICD-10-CM

## 2013-05-14 MED ORDER — NEBIVOLOL HCL 5 MG PO TABS: 5.0000 mg | ORAL_TABLET | Freq: Every day | ORAL | Status: DC

## 2013-05-14 NOTE — Telephone Encounter (Signed)
Pt called stating she just received her last refill of Bystolic and wanted to know when she is supposed to follow up in the office. Advised pt refill will be sent and she should follow up around August. Pt voices understanding.

## 2013-06-18 ENCOUNTER — Other Ambulatory Visit: Payer: Self-pay | Admitting: Physician Assistant

## 2013-07-05 ENCOUNTER — Telehealth: Payer: Self-pay | Admitting: *Deleted

## 2013-07-05 ENCOUNTER — Encounter: Payer: Self-pay | Admitting: Family

## 2013-07-05 ENCOUNTER — Ambulatory Visit (INDEPENDENT_AMBULATORY_CARE_PROVIDER_SITE_OTHER): Payer: Managed Care, Other (non HMO) | Admitting: Family

## 2013-07-05 ENCOUNTER — Ambulatory Visit (HOSPITAL_BASED_OUTPATIENT_CLINIC_OR_DEPARTMENT_OTHER)
Admission: RE | Admit: 2013-07-05 | Discharge: 2013-07-05 | Disposition: A | Discharge status: Home / Self Care | Payer: Managed Care, Other (non HMO) | Source: Ambulatory Visit | Attending: Family | Admitting: Family

## 2013-07-05 VITALS — BP 106/82 | HR 74 | Temp 98.2°F | Resp 16 | Ht 63.0 in | Wt 173.1 lb

## 2013-07-05 DIAGNOSIS — R3129 Other microscopic hematuria: Secondary | ICD-10-CM | POA: Insufficient documentation

## 2013-07-05 DIAGNOSIS — N281 Cyst of kidney, acquired: Secondary | ICD-10-CM

## 2013-07-05 DIAGNOSIS — M79601 Pain in right arm: Secondary | ICD-10-CM

## 2013-07-05 DIAGNOSIS — M25529 Pain in unspecified elbow: Secondary | ICD-10-CM

## 2013-07-05 DIAGNOSIS — N200 Calculus of kidney: Secondary | ICD-10-CM | POA: Insufficient documentation

## 2013-07-05 DIAGNOSIS — M25531 Pain in right wrist: Secondary | ICD-10-CM

## 2013-07-05 DIAGNOSIS — N390 Urinary tract infection, site not specified: Secondary | ICD-10-CM | POA: Insufficient documentation

## 2013-07-05 DIAGNOSIS — M25539 Pain in unspecified wrist: Secondary | ICD-10-CM | POA: Insufficient documentation

## 2013-07-05 DIAGNOSIS — M25521 Pain in right elbow: Secondary | ICD-10-CM

## 2013-07-05 DIAGNOSIS — M79603 Pain in arm, unspecified: Secondary | ICD-10-CM | POA: Insufficient documentation

## 2013-07-05 DIAGNOSIS — M79609 Pain in unspecified limb: Secondary | ICD-10-CM

## 2013-07-05 LAB — POCT URINALYSIS DIPSTICK
Bilirubin, UA: NEGATIVE
Glucose, UA: NEGATIVE
Ketones, UA: NEGATIVE
Nitrite, UA: POSITIVE
Spec Grav, UA: 1.025
Urobilinogen, UA: 0.2
pH, UA: 6

## 2013-07-05 MED ORDER — MELOXICAM 7.5 MG PO TABS: 7.5000 mg | ORAL_TABLET | Freq: Every day | ORAL | Status: DC

## 2013-07-05 MED ORDER — CIPROFLOXACIN HCL 500 MG PO TABS: 500.0000 mg | ORAL_TABLET | Freq: Two times a day (BID) | ORAL | Status: DC

## 2013-07-05 NOTE — Telephone Encounter (Signed)
Left detailed message re: results. Advised pt to try dose of miralax of fiber for probable constipation, and that we will plan an [place] of her kidney.

## 2013-07-05 NOTE — Progress Notes (Signed)
Pre visit review using our clinic review tool, if applicable. No additional management support is needed unless otherwise documented below in the visit note. 

## 2013-07-05 NOTE — Assessment & Plan Note (Signed)
Pt was given wrist brace today.

## 2013-07-05 NOTE — Assessment & Plan Note (Signed)
Urin dip + blood, +nitrates- will send for culture and rx with cipro. Given left pelvic pain and microscopic hematuria, I am suspicious for kidney stone. Will send for CT.

## 2013-07-05 NOTE — Patient Instructions (Signed)
Please start meloxicam for arm pain.  Ok to continue as needed flexeril. You will be contacted about your referral to Sports medicine. Please let us know if you have not heard back within 1 week about your referral.

## 2013-07-05 NOTE — Telephone Encounter (Signed)
Received call from Len at [facility] re: CT result. 45mm non-obstructive renal calculi, small cyst post midpole left kidney. Recommends further non-emergent imaging. Results are in EPIC. Please advise.

## 2013-07-05 NOTE — Assessment & Plan Note (Signed)
Trial of meloxicam. Will refer to sports med for further evaluation.

## 2013-07-05 NOTE — Progress Notes (Signed)
Subjective:    Patient ID: [name], female    DOB: August 21, 1965, 48 y.o.   MRN: [id number]  HPI  Ms. Walt is a 48 yr old female who presents today with two concerns:  1) Low back pain- Reports low back pain, has associated pain in the left lower abdomen/pelvis eft pelvic pain with urination only. Denies associated dysuria. She does have urinary frequency but attributes this to bp meds.    2) R arm/ R wrist pain-Got hurt at work in February. Was released to go back to work on 2/1, went back to work 4/1.  4/7, had another injury at work.  Now having right arm pain, told not to lift >15 pounds. She was released to go back to work. Trouble sleeping due to right upper arm/shoulder pain.  + pain in right wrist, right shoulder.  Pain shoots down the right arm.  Right arm weakness.  Denies neck pain.  Tried chiro but stopped because too painful. She reports that x rays of the right arm were normal.    Review of Systems See HPI  Past Medical History  Diagnosis Date  . Hypertension   . Headache(784.0)     Chronic migraines--Dr Lewitt  . Meckel's diverticulum 08/2004    with ischemia  . 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 Uterine adhesions   . Sacroiliac dysfunction 06/23/2012    History   Social History  . Marital Status: Single    Spouse Name: N/A    Number of Children: 3  . Years of Education: N/A   Occupational History  . Cashier    Social History Main Topics  . Smoking status: Never Smoker   . Smokeless tobacco: Never Used  . Alcohol Use: Yes     Comment: social  . Drug Use: Not on file  . Sexual Activity: Not on file   Other Topics Concern  . Not on file   Social History Narrative  . No narrative on file    Past Surgical History  Procedure Laterality Date  . Laparoscopy  11/2005    total laparoscopy  . Laparoscopic salpingoopherectomy  11/2005    right-- assisted with daVinci robot, and lysis of omental adhesions.  . Abdominal hysterectomy  11/2005  . Other  surgical history      Meckel's diverticulectomy, small bowel resection and resection of retrocecal appendix.    Family History  Problem Relation Age of Onset  . Cancer Neg Hx   . Hypertension Other     No Known Allergies  Current Outpatient Prescriptions on File Prior to Visit  Medication Sig Dispense Refill  . BYSTOLIC 5 MG tablet TAKE 1 TABLET (5 MG TOTAL) BY MOUTH DAILY.  30 tablet  2  . chlorzoxazone (PARAFON) 500 MG tablet Take 500 mg by mouth as needed.        . cyclobenzaprine (FLEXERIL) 10 MG tablet Take 1 tablet (10 mg total) by mouth at bedtime as needed for muscle spasms.  30 tablet  1  . eletriptan (RELPAX) 40 MG tablet One tablet by mouth as needed for migraine headache.  If the headache improves and then returns, dose may be repeated after 2 hours have elapsed since first dose (do not exceed 80 mg per day). may repeat in 2 hours if necessary       . hydrochlorothiazide (HYDRODIURIL) 25 MG tablet Take 1 tablet (25 mg total) by mouth daily.  30 tablet  3  . ketoprofen (ORUDIS) 75  MG capsule Take 1 tablet as needed for headaches.       Marland Kitchen lisinopril (PRINIVIL,ZESTRIL) 20 MG tablet TAKE 1 TABLET BY MOUTH DAILY.  30 tablet  3  . metaxalone (SKELAXIN) 800 MG tablet Take 800 mg by mouth 3 (three) times daily as needed.       . methocarbamol (ROBAXIN) 500 MG tablet Take 1 tablet (500 mg total) by mouth 2 (two) times daily as needed.  40 tablet  1  . naproxen (NAPROSYN) 500 MG tablet Take 1 tablet (500 mg total) by mouth 2 (two) times daily as needed. For pain with food  60 tablet  1  . ondansetron (ZOFRAN) 4 MG tablet Take 4 mg by mouth 3 (three) times daily as needed. For nausea       . oxyCODONE-acetaminophen (PERCOCET) 5-325 MG per tablet Take 1 tablet by mouth daily as needed. For pain.        No current facility-administered medications on file prior to visit.    BP 106/82  Pulse 74  Temp(Src) 98.2 F (36.8 C) (Oral)  Resp 16  Ht 5\' 3"  (1.6 m)  Wt 173 lb 1.3 oz (78.509  kg)  BMI 30.67 kg/m2  SpO2 98%  LMP 11/10/2005       Objective:   Physical Exam  Constitutional: She appears well-developed and well-nourished. No distress.  HENT:  Head: Normocephalic and atraumatic.  Cardiovascular: Normal rate and regular rhythm.   No murmur heard. Pulmonary/Chest: Effort normal and breath sounds normal. No respiratory distress. She has no wheezes. She has no rales. She exhibits no tenderness.  Abdominal: Soft. Bowel sounds are normal. She exhibits no distension. There is tenderness in the left lower quadrant.  Musculoskeletal: Normal range of motion.  No right wrist swelling is noted.  + pain with right wrist flexion. + pain overlying right bicep, + pain with ROM right shoulder.   Lymphadenopathy:    She has no cervical adenopathy.  Neurological: She is alert.  Psychiatric: She has a normal mood and affect. Her behavior is normal. Judgment and thought content normal.          Assessment & Plan:

## 2013-07-07 LAB — URINE CULTURE: Colony Count: >=100000

## 2013-07-09 ENCOUNTER — Other Ambulatory Visit (HOSPITAL_COMMUNITY): Payer: Self-pay | Admitting: Family

## 2013-07-09 ENCOUNTER — Ambulatory Visit (HOSPITAL_COMMUNITY)
Admission: RE | Admit: 2013-07-09 | Discharge: 2013-07-09 | Disposition: A | Discharge status: Home / Self Care | Payer: Managed Care, Other (non HMO) | Source: Ambulatory Visit | Attending: Family | Admitting: Family

## 2013-07-09 DIAGNOSIS — I10 Essential (primary) hypertension: Secondary | ICD-10-CM | POA: Insufficient documentation

## 2013-07-09 DIAGNOSIS — M549 Dorsalgia, unspecified: Secondary | ICD-10-CM

## 2013-07-09 DIAGNOSIS — Q619 Cystic kidney disease, unspecified: Secondary | ICD-10-CM | POA: Insufficient documentation

## 2013-07-09 DIAGNOSIS — N281 Cyst of kidney, acquired: Secondary | ICD-10-CM

## 2013-07-09 DIAGNOSIS — N390 Urinary tract infection, site not specified: Secondary | ICD-10-CM

## 2013-07-09 NOTE — Progress Notes (Addendum)
Bilateral renal artery duplex:  No evidence of significant renal artery stenosis (0-59% stenosis).

## 2013-07-10 ENCOUNTER — Encounter: Payer: Self-pay | Admitting: Family Medicine

## 2013-07-10 ENCOUNTER — Ambulatory Visit (INDEPENDENT_AMBULATORY_CARE_PROVIDER_SITE_OTHER): Payer: Managed Care, Other (non HMO) | Admitting: Family Medicine

## 2013-07-10 ENCOUNTER — Other Ambulatory Visit (HOSPITAL_BASED_OUTPATIENT_CLINIC_OR_DEPARTMENT_OTHER): Payer: Managed Care, Other (non HMO)

## 2013-07-10 VITALS — BP 123/81 | HR 64 | Ht 63.0 in | Wt 173.0 lb

## 2013-07-10 DIAGNOSIS — M25531 Pain in right wrist: Secondary | ICD-10-CM

## 2013-07-10 DIAGNOSIS — M25519 Pain in unspecified shoulder: Secondary | ICD-10-CM

## 2013-07-10 DIAGNOSIS — M25511 Pain in right shoulder: Secondary | ICD-10-CM

## 2013-07-10 DIAGNOSIS — M25539 Pain in unspecified wrist: Secondary | ICD-10-CM

## 2013-07-10 MED ORDER — METHYLPREDNISOLONE ACETATE 40 MG/ML IJ SUSP
40.0000 mg | Freq: Once | INTRAMUSCULAR | Status: AC
  Administered 2013-07-10: 40 mg via INTRA_ARTICULAR

## 2013-07-10 NOTE — Patient Instructions (Signed)
You have strained your rotator cuff. Try to avoid painful activities (overhead activities, lifting with extended arm) as much as possible; no lifting more than 15 pounds. Aleve 2 tabs twice a day with food OR ibuprofen 3 tabs three times a day with food for pain and inflammation. Can take tylenol in addition to this. Subacromial injection may be beneficial to help with pain and to decrease inflammation - you were given this today. Start physical therapy with transition to home exercise program. Do home exercise program with theraband and scapular stabilization exercises daily - these are very important for long term relief even if an injection was given. If not improving at follow-up we will consider further imaging (MRI).  You have a wrist sprain. Wear wrist brace as often as possible. Ok to take this off to shower, ice the area up to 3-4 times a day 15 minutes at a time. Aleve or ibuprofen as noted above. Follow up with me in 4-6 weeks for both issues.

## 2013-07-15 ENCOUNTER — Encounter: Payer: Self-pay | Admitting: Family Medicine

## 2013-07-15 DIAGNOSIS — M25531 Pain in right wrist: Secondary | ICD-10-CM | POA: Insufficient documentation

## 2013-07-15 DIAGNOSIS — M25511 Pain in right shoulder: Secondary | ICD-10-CM | POA: Insufficient documentation

## 2013-07-15 NOTE — Assessment & Plan Note (Signed)
2/2 rotator cuff strain.  Discussed relative rest.  NSAIDs, start physical therapy.  Subacromial injection given today as well.  Consider MRI if not improving at f/u in 4-6 weeks.  After informed written consent, patient was seated on exam table. Right shoulder was prepped with alcohol swab and utilizing posterior approach, patient's right subacromial space was injected with 3:1 marcaine: depomedrol. Patient tolerated the procedure well without immediate complications.

## 2013-07-15 NOTE — Assessment & Plan Note (Signed)
Right wrist sprain - wrist brace, icing, nsaids as needed.  F/u in 4-6 weeks.

## 2013-07-15 NOTE — Progress Notes (Signed)
Patient ID: [name], female   DOB: 05-12-1965, 48 y.o.   MRN: [id number]  PCP: Nance Pear., NP  Subjective:   HPI: Patient is a 48 y.o. female here for right arm pain.  Patient works at [employer]. She reports having sustained two separate injuries at work. Had been seeing workers comp for right shoulder injury, was released on 4/1 then reinjured on 4/7. Reports a pallet of water hoses came down and hit her on the right arm. Radiographs were negative. Has pain in right wrist and right shoulder. No swelling or bruising. + night pain. Has not tried PT or had more advanced imaging. Unable to sleep on right side. Hard to lift items. Tried a wrist brace, muscle relaxant. No prior issues before these injuries.  Past Medical History  Diagnosis Date  . Hypertension   . Headache(784.0)     Chronic migraines--Dr Lewitt  . Meckel's diverticulum 08/2004    with ischemia  . 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 Uterine adhesions   . Sacroiliac dysfunction 06/23/2012    Current Outpatient Prescriptions on File Prior to Visit  Medication Sig Dispense Refill  . BYSTOLIC 5 MG tablet TAKE 1 TABLET (5 MG TOTAL) BY MOUTH DAILY.  30 tablet  2  . chlorzoxazone (PARAFON) 500 MG tablet Take 500 mg by mouth as needed.        . ciprofloxacin (CIPRO) 500 MG tablet Take 1 tablet (500 mg total) by mouth 2 (two) times daily.  14 tablet  0  . cyclobenzaprine (FLEXERIL) 10 MG tablet Take 1 tablet (10 mg total) by mouth at bedtime as needed for muscle spasms.  30 tablet  1  . eletriptan (RELPAX) 40 MG tablet One tablet by mouth as needed for migraine headache.  If the headache improves and then returns, dose may be repeated after 2 hours have elapsed since first dose (do not exceed 80 mg per day). may repeat in 2 hours if necessary       . hydrochlorothiazide (HYDRODIURIL) 25 MG tablet Take 1 tablet (25 mg total) by mouth daily.  30 tablet  3  . ketoprofen (ORUDIS) 75 MG capsule Take 1 tablet as needed  for headaches.       Marland Kitchen lisinopril (PRINIVIL,ZESTRIL) 20 MG tablet TAKE 1 TABLET BY MOUTH DAILY.  30 tablet  3  . meloxicam (MOBIC) 7.5 MG tablet Take 1 tablet (7.5 mg total) by mouth daily.  15 tablet  0  . metaxalone (SKELAXIN) 800 MG tablet Take 800 mg by mouth 3 (three) times daily as needed.       Marland Kitchen oxyCODONE-acetaminophen (PERCOCET) 5-325 MG per tablet Take 1 tablet by mouth daily as needed. For pain.        No current facility-administered medications on file prior to visit.    Past Surgical History  Procedure Laterality Date  . Laparoscopy  11/2005    total laparoscopy  . Laparoscopic salpingoopherectomy  11/2005    right-- assisted with daVinci robot, and lysis of omental adhesions.  . Abdominal hysterectomy  11/2005  . Other surgical history      Meckel's diverticulectomy, small bowel resection and resection of retrocecal appendix.  . Bowel reconstruction      No Known Allergies  History   Social History  . Marital Status: Single    Spouse Name: N/A    Number of Children: 3  . Years of Education: N/A   Occupational History  . Cashier    Social History  Main Topics  . Smoking status: Never Smoker   . Smokeless tobacco: Never Used  . Alcohol Use: Yes     Comment: social  . Drug Use: Not on file  . Sexual Activity: Not on file   Other Topics Concern  . Not on file   Social History Narrative  . No narrative on file    Family History  Problem Relation Age of Onset  . Cancer Neg Hx   . Hypertension Other     BP 123/81  Pulse 64  Ht 5\' 3"  (1.6 m)  Wt 173 lb (78.472 kg)  BMI 30.65 kg/m2  LMP 11/10/2005  Review of Systems: See HPI above.    Objective:  Physical Exam:  Gen: NAD  Right wrist: No gross deformity, swelling, bruising. TTP dorsal wrist joint.  No scaphoid, other bony tenderness. FROM with pain on full extension, flexion. NVI distally.  Right shoulder: No swelling, ecchymoses.  No gross deformity. No TTP AC joint, biceps  tendon. FROM with painful arc. Mild positive Hawkins, Neers. Negative Speeds, Yergasons. Strength 4/5 with empty can and 5-/5 resisted ER, 5/5 resisted internal rotation. Negative apprehension. NV intact distally.    Assessment & Plan:  1. Right shoulder pain - 2/2 rotator cuff strain.  Discussed relative rest.  NSAIDs, start physical therapy.  Subacromial injection given today as well.  Consider MRI if not improving at f/u in 4-6 weeks.  After informed written consent, patient was seated on exam table. Right shoulder was prepped with alcohol swab and utilizing posterior approach, patient's right subacromial space was injected with 3:1 marcaine: depomedrol. Patient tolerated the procedure well without immediate complications.  2. Right wrist sprain - wrist brace, icing, nsaids as needed.  F/u in 4-6 weeks.

## 2013-07-22 ENCOUNTER — Ambulatory Visit: Payer: Managed Care, Other (non HMO) | Admitting: Physical Therapy

## 2013-07-29 ENCOUNTER — Ambulatory Visit: Payer: Managed Care, Other (non HMO) | Attending: Family Medicine | Admitting: Physical Therapy

## 2013-07-29 DIAGNOSIS — M25519 Pain in unspecified shoulder: Secondary | ICD-10-CM | POA: Insufficient documentation

## 2013-07-29 DIAGNOSIS — M545 Low back pain, unspecified: Secondary | ICD-10-CM | POA: Diagnosis not present

## 2013-07-29 DIAGNOSIS — M25539 Pain in unspecified wrist: Secondary | ICD-10-CM | POA: Insufficient documentation

## 2013-07-29 DIAGNOSIS — IMO0001 Reserved for inherently not codable concepts without codable children: Secondary | ICD-10-CM | POA: Insufficient documentation

## 2013-07-29 DIAGNOSIS — M6281 Muscle weakness (generalized): Secondary | ICD-10-CM | POA: Diagnosis not present

## 2013-07-29 DIAGNOSIS — I10 Essential (primary) hypertension: Secondary | ICD-10-CM | POA: Insufficient documentation

## 2013-07-29 DIAGNOSIS — G43909 Migraine, unspecified, not intractable, without status migrainosus: Secondary | ICD-10-CM | POA: Insufficient documentation

## 2013-07-29 DIAGNOSIS — M5430 Sciatica, unspecified side: Secondary | ICD-10-CM | POA: Diagnosis not present

## 2013-07-31 ENCOUNTER — Ambulatory Visit: Payer: Managed Care, Other (non HMO) | Admitting: Rehabilitation

## 2013-07-31 DIAGNOSIS — IMO0001 Reserved for inherently not codable concepts without codable children: Secondary | ICD-10-CM | POA: Diagnosis not present

## 2013-08-06 ENCOUNTER — Ambulatory Visit: Payer: Managed Care, Other (non HMO) | Admitting: Physical Therapy

## 2013-08-08 ENCOUNTER — Ambulatory Visit: Payer: Managed Care, Other (non HMO) | Admitting: Physical Therapy

## 2013-08-12 ENCOUNTER — Ambulatory Visit: Payer: Managed Care, Other (non HMO) | Admitting: Rehabilitation

## 2013-08-14 ENCOUNTER — Ambulatory Visit: Payer: Managed Care, Other (non HMO) | Attending: Family Medicine | Admitting: Rehabilitation

## 2013-08-14 DIAGNOSIS — M545 Low back pain, unspecified: Secondary | ICD-10-CM | POA: Diagnosis not present

## 2013-08-14 DIAGNOSIS — G43909 Migraine, unspecified, not intractable, without status migrainosus: Secondary | ICD-10-CM | POA: Insufficient documentation

## 2013-08-14 DIAGNOSIS — M25539 Pain in unspecified wrist: Secondary | ICD-10-CM | POA: Diagnosis not present

## 2013-08-14 DIAGNOSIS — M25519 Pain in unspecified shoulder: Secondary | ICD-10-CM | POA: Insufficient documentation

## 2013-08-14 DIAGNOSIS — M5430 Sciatica, unspecified side: Secondary | ICD-10-CM | POA: Diagnosis not present

## 2013-08-14 DIAGNOSIS — IMO0001 Reserved for inherently not codable concepts without codable children: Secondary | ICD-10-CM | POA: Diagnosis not present

## 2013-08-14 DIAGNOSIS — M6281 Muscle weakness (generalized): Secondary | ICD-10-CM | POA: Diagnosis not present

## 2013-08-14 DIAGNOSIS — I10 Essential (primary) hypertension: Secondary | ICD-10-CM | POA: Diagnosis not present

## 2013-08-19 ENCOUNTER — Ambulatory Visit: Payer: Managed Care, Other (non HMO) | Admitting: Physical Therapy

## 2013-08-21 ENCOUNTER — Ambulatory Visit: Payer: Managed Care, Other (non HMO) | Admitting: Rehabilitation

## 2013-08-21 ENCOUNTER — Ambulatory Visit (HOSPITAL_BASED_OUTPATIENT_CLINIC_OR_DEPARTMENT_OTHER)
Admission: RE | Admit: 2013-08-21 | Discharge: 2013-08-21 | Disposition: A | Discharge status: Home / Self Care | Payer: Managed Care, Other (non HMO) | Source: Ambulatory Visit | Attending: Family Medicine | Admitting: Family Medicine

## 2013-08-21 ENCOUNTER — Ambulatory Visit (INDEPENDENT_AMBULATORY_CARE_PROVIDER_SITE_OTHER): Payer: Managed Care, Other (non HMO) | Admitting: Family Medicine

## 2013-08-21 ENCOUNTER — Encounter: Payer: Self-pay | Admitting: Family Medicine

## 2013-08-21 VITALS — BP 147/89 | HR 72 | Ht 63.0 in | Wt 170.0 lb

## 2013-08-21 DIAGNOSIS — M25511 Pain in right shoulder: Secondary | ICD-10-CM

## 2013-08-21 DIAGNOSIS — M25531 Pain in right wrist: Secondary | ICD-10-CM

## 2013-08-21 DIAGNOSIS — M25539 Pain in unspecified wrist: Secondary | ICD-10-CM | POA: Diagnosis present

## 2013-08-21 DIAGNOSIS — IMO0001 Reserved for inherently not codable concepts without codable children: Secondary | ICD-10-CM | POA: Diagnosis not present

## 2013-08-21 DIAGNOSIS — M25519 Pain in unspecified shoulder: Secondary | ICD-10-CM

## 2013-08-21 NOTE — Patient Instructions (Signed)
Call me over the next few weeks if your wrist isn't improving - next step would be to do an MRI. Otherwise follow up with me in 6 weeks or as needed. Wrist brace or ACE wrap for support and compression. Icing as you have been. Aleve or ibuprofen as needed.

## 2013-08-22 ENCOUNTER — Encounter: Payer: Self-pay | Admitting: Family Medicine

## 2013-08-22 NOTE — Assessment & Plan Note (Signed)
2/2 rotator cuff strain.  Much improved.  NSAIDs as needed.  Transition to HEP.  S/p injection.

## 2013-08-22 NOTE — Progress Notes (Signed)
Patient ID: [name], female   DOB: [birth year], 48 y.o.   MRN: [id number]  PCP: Nance Pear., NP  Subjective:   HPI: Patient is a 48 y.o. female here for right arm pain.  7/1: Patient works at [employer]. She reports having sustained two separate injuries at work. Had been seeing workers comp for right shoulder injury, was released on 4/1 then reinjured on 4/7. Reports a pallet of water hoses came down and hit her on the right arm. Radiographs were negative. Has pain in right wrist and right shoulder. No swelling or bruising. + night pain. Has not tried PT or had more advanced imaging. Unable to sleep on right side. Hard to lift items. Tried a wrist brace, muscle relaxant. No prior issues before these injuries.  8/12: Patient reports her right shoulder is better following therapy and injection. Her right wrist is still quite bothersome though she has been using this more. Difficulty wearing wrist brace when working so she takes it off to do so. Takes nsaids as well. Done well with PT.  Past Medical History  Diagnosis Date  . Hypertension   . Headache(784.0)     Chronic migraines--Dr Lewitt  . Meckel's diverticulum 08/2004    with ischemia  . 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 Uterine adhesions   . Sacroiliac dysfunction 06/23/2012    Current Outpatient Prescriptions on File Prior to Visit  Medication Sig Dispense Refill  . BYSTOLIC 5 MG tablet TAKE 1 TABLET (5 MG TOTAL) BY MOUTH DAILY.  30 tablet  2  . chlorzoxazone (PARAFON) 500 MG tablet Take 500 mg by mouth as needed.        . cyclobenzaprine (FLEXERIL) 10 MG tablet Take 1 tablet (10 mg total) by mouth at bedtime as needed for muscle spasms.  30 tablet  1  . eletriptan (RELPAX) 40 MG tablet One tablet by mouth as needed for migraine headache.  If the headache improves and then returns, dose may be repeated after 2 hours have elapsed since first dose (do not exceed 80 mg per day). may repeat in 2 hours if  necessary       . hydrochlorothiazide (HYDRODIURIL) 25 MG tablet Take 1 tablet (25 mg total) by mouth daily.  30 tablet  3  . ketoprofen (ORUDIS) 75 MG capsule Take 1 tablet as needed for headaches.       Marland Kitchen lisinopril (PRINIVIL,ZESTRIL) 20 MG tablet TAKE 1 TABLET BY MOUTH DAILY.  30 tablet  3  . meloxicam (MOBIC) 7.5 MG tablet Take 1 tablet (7.5 mg total) by mouth daily.  15 tablet  0  . metaxalone (SKELAXIN) 800 MG tablet Take 800 mg by mouth 3 (three) times daily as needed.       Marland Kitchen oxyCODONE-acetaminophen (PERCOCET) 5-325 MG per tablet Take 1 tablet by mouth daily as needed. For pain.        No current facility-administered medications on file prior to visit.    Past Surgical History  Procedure Laterality Date  . Laparoscopy  11/2005    total laparoscopy  . Laparoscopic salpingoopherectomy  11/2005    right-- assisted with daVinci robot, and lysis of omental adhesions.  . Abdominal hysterectomy  11/2005  . Other surgical history      Meckel's diverticulectomy, small bowel resection and resection of retrocecal appendix.  . Bowel reconstruction      No Known Allergies  History   Social History  . Marital Status: Single    Spouse Name:  N/A    Number of Children: 3  . Years of Education: N/A   Occupational History  . Cashier    Social History Main Topics  . Smoking status: Never Smoker   . Smokeless tobacco: Never Used  . Alcohol Use: Yes     Comment: social  . Drug Use: Not on file  . Sexual Activity: Not on file   Other Topics Concern  . Not on file   Social History Narrative  . No narrative on file    Family History  Problem Relation Age of Onset  . Cancer Neg Hx   . Hypertension Other     BP 147/89  Pulse 72  Ht 5\' 3"  (1.6 m)  Wt 170 lb (77.111 kg)  BMI 30.12 kg/m2  LMP 11/10/2005  Review of Systems: See HPI above.    Objective:  Physical Exam:  Gen: NAD  Right wrist: No gross deformity, swelling, bruising. TTP dorsal wrist joint.  No  scaphoid, other bony tenderness. FROM with pain on full extension, flexion. NVI distally.  Right shoulder: No swelling, ecchymoses.  No gross deformity. No TTP AC joint, biceps tendon. FROM with negative painful arc. Negative Hawkins, Neers. Negative Speeds, Yergasons. Strength 5/5 with empty can, resisted ER, IR. Negative apprehension. NV intact distally.    Assessment & Plan:  1. Right shoulder pain - 2/2 rotator cuff strain.  Much improved.  NSAIDs as needed.  Transition to HEP.  S/p injection.  2. Right wrist injury - most likely still severe sprain and hasn't been able to use brace all the time.  She would like to wait on further imaging but consider MRI if not improving over next few weeks.  Otherwise f/u in 6 weeks.

## 2013-08-22 NOTE — Assessment & Plan Note (Signed)
most likely still severe sprain and hasn't been able to use brace all the time.  She would like to wait on further imaging but consider MRI if not improving over next few weeks.  Otherwise f/u in 6 weeks.

## 2013-08-26 ENCOUNTER — Encounter: Payer: Self-pay | Admitting: Family Medicine

## 2013-08-26 ENCOUNTER — Telehealth: Payer: Self-pay | Admitting: Family Medicine

## 2013-08-26 ENCOUNTER — Ambulatory Visit: Payer: Managed Care, Other (non HMO) | Admitting: Physical Therapy

## 2013-08-26 DIAGNOSIS — IMO0001 Reserved for inherently not codable concepts without codable children: Secondary | ICD-10-CM | POA: Diagnosis not present

## 2013-08-26 NOTE — Telephone Encounter (Signed)
I would just extend current restrictions for 6 more weeks.  Letter written.

## 2013-08-28 ENCOUNTER — Ambulatory Visit: Payer: Managed Care, Other (non HMO) | Admitting: Rehabilitation

## 2013-08-28 DIAGNOSIS — IMO0001 Reserved for inherently not codable concepts without codable children: Secondary | ICD-10-CM | POA: Diagnosis not present

## 2013-09-03 ENCOUNTER — Ambulatory Visit: Payer: Managed Care, Other (non HMO) | Admitting: Physical Therapy

## 2013-09-03 DIAGNOSIS — IMO0001 Reserved for inherently not codable concepts without codable children: Secondary | ICD-10-CM | POA: Diagnosis not present

## 2013-09-06 ENCOUNTER — Ambulatory Visit: Payer: Managed Care, Other (non HMO) | Admitting: Physical Therapy

## 2013-09-06 DIAGNOSIS — IMO0001 Reserved for inherently not codable concepts without codable children: Secondary | ICD-10-CM | POA: Diagnosis not present

## 2013-09-11 ENCOUNTER — Ambulatory Visit: Payer: Managed Care, Other (non HMO) | Attending: Family Medicine | Admitting: Rehabilitation

## 2013-09-11 DIAGNOSIS — I10 Essential (primary) hypertension: Secondary | ICD-10-CM | POA: Diagnosis not present

## 2013-09-11 DIAGNOSIS — M545 Low back pain, unspecified: Secondary | ICD-10-CM | POA: Insufficient documentation

## 2013-09-11 DIAGNOSIS — M5430 Sciatica, unspecified side: Secondary | ICD-10-CM | POA: Insufficient documentation

## 2013-09-11 DIAGNOSIS — M25539 Pain in unspecified wrist: Secondary | ICD-10-CM | POA: Diagnosis not present

## 2013-09-11 DIAGNOSIS — M6281 Muscle weakness (generalized): Secondary | ICD-10-CM | POA: Diagnosis not present

## 2013-09-11 DIAGNOSIS — G43909 Migraine, unspecified, not intractable, without status migrainosus: Secondary | ICD-10-CM | POA: Diagnosis not present

## 2013-09-11 DIAGNOSIS — IMO0001 Reserved for inherently not codable concepts without codable children: Secondary | ICD-10-CM | POA: Diagnosis not present

## 2013-09-11 DIAGNOSIS — M25519 Pain in unspecified shoulder: Secondary | ICD-10-CM | POA: Insufficient documentation

## 2013-09-18 ENCOUNTER — Ambulatory Visit: Payer: Managed Care, Other (non HMO) | Admitting: Physical Therapy

## 2013-09-25 ENCOUNTER — Ambulatory Visit: Payer: Managed Care, Other (non HMO) | Admitting: Physical Therapy

## 2013-10-02 ENCOUNTER — Ambulatory Visit: Payer: Managed Care, Other (non HMO) | Admitting: Family Medicine

## 2013-11-05 ENCOUNTER — Encounter: Payer: Self-pay | Admitting: Physician Assistant

## 2013-11-05 ENCOUNTER — Ambulatory Visit (INDEPENDENT_AMBULATORY_CARE_PROVIDER_SITE_OTHER): Payer: Managed Care, Other (non HMO) | Admitting: Physician Assistant

## 2013-11-05 VITALS — BP 147/94 | HR 79 | Temp 98.7°F | Resp 16 | Ht 63.0 in | Wt 171.1 lb

## 2013-11-05 DIAGNOSIS — J0110 Acute frontal sinusitis, unspecified: Secondary | ICD-10-CM

## 2013-11-05 DIAGNOSIS — I10 Essential (primary) hypertension: Secondary | ICD-10-CM

## 2013-11-05 MED ORDER — HYDROCHLOROTHIAZIDE 25 MG PO TABS: 25.0000 mg | ORAL_TABLET | Freq: Every day | ORAL | Status: DC

## 2013-11-05 MED ORDER — LISINOPRIL 20 MG PO TABS: ORAL_TABLET | ORAL | Status: DC

## 2013-11-05 MED ORDER — AMOXICILLIN-POT CLAVULANATE 875-125 MG PO TABS: 1.0000 | ORAL_TABLET | Freq: Two times a day (BID) | ORAL | Status: DC

## 2013-11-05 MED ORDER — NEBIVOLOL HCL 5 MG PO TABS: ORAL_TABLET | ORAL | Status: DC

## 2013-11-05 NOTE — Progress Notes (Signed)
Pre visit review using our clinic review tool, if applicable. No additional management support is needed unless otherwise documented below in the visit note/SLS  

## 2013-11-05 NOTE — Patient Instructions (Signed)
Please take antibiotic as directed (Augmentin).  Increase fluid intake.  Use Saline nasal spray.  Take a daily multivitamin. Use Delsym for cough.  Place a humidifier in the bedroom.  Please call or return clinic if symptoms are not improving.  Sinusitis Sinusitis is redness, soreness, and swelling (inflammation) of the paranasal sinuses. Paranasal sinuses are air pockets within the bones of your face (beneath the eyes, the middle of the forehead, or above the eyes). In healthy paranasal sinuses, mucus is able to drain out, and air is able to circulate through them by way of your nose. However, when your paranasal sinuses are inflamed, mucus and air can become trapped. This can allow bacteria and other germs to grow and cause infection. Sinusitis can develop quickly and last only a short time (acute) or continue over a long period (chronic). Sinusitis that lasts for more than 12 weeks is considered chronic.  CAUSES  Causes of sinusitis include:  Allergies.  Structural abnormalities, such as displacement of the cartilage that separates your nostrils (deviated septum), which can decrease the air flow through your nose and sinuses and affect sinus drainage.  Functional abnormalities, such as when the small hairs (cilia) that line your sinuses and help remove mucus do not work properly or are not present. SYMPTOMS  Symptoms of acute and chronic sinusitis are the same. The primary symptoms are pain and pressure around the affected sinuses. Other symptoms include:  Upper toothache.  Earache.  Headache.  Bad breath.  Decreased sense of smell and taste.  A cough, which worsens when you are lying flat.  Fatigue.  Fever.  Thick drainage from your nose, which often is green and may contain pus (purulent).  Swelling and warmth over the affected sinuses. DIAGNOSIS  Your caregiver will perform a physical exam. During the exam, your caregiver may:  Look in your nose for signs of abnormal  growths in your nostrils (nasal polyps).  Tap over the affected sinus to check for signs of infection.  View the inside of your sinuses (endoscopy) with a special imaging device with a light attached (endoscope), which is inserted into your sinuses. If your caregiver suspects that you have chronic sinusitis, one or more of the following tests may be recommended:  Allergy tests.  Nasal culture A sample of mucus is taken from your nose and sent to a lab and screened for bacteria.  Nasal cytology A sample of mucus is taken from your nose and examined by your caregiver to determine if your sinusitis is related to an allergy. TREATMENT  Most cases of acute sinusitis are related to a viral infection and will resolve on their own within 10 days. Sometimes medicines are prescribed to help relieve symptoms (pain medicine, decongestants, nasal steroid sprays, or saline sprays).  However, for sinusitis related to a bacterial infection, your caregiver will prescribe antibiotic medicines. These are medicines that will help kill the bacteria causing the infection.  Rarely, sinusitis is caused by a fungal infection. In theses cases, your caregiver will prescribe antifungal medicine. For some cases of chronic sinusitis, surgery is needed. Generally, these are cases in which sinusitis recurs more than 3 times per year, despite other treatments. HOME CARE INSTRUCTIONS   Drink plenty of water. Water helps thin the mucus so your sinuses can drain more easily.  Use a humidifier.  Inhale steam 3 to 4 times a day (for example, sit in the bathroom with the shower running).  Apply a warm, moist washcloth to your face 3  to 4 times a day, or as directed by your caregiver.  Use saline nasal sprays to help moisten and clean your sinuses.  Take over-the-counter or prescription medicines for pain, discomfort, or fever only as directed by your caregiver. SEEK IMMEDIATE MEDICAL CARE IF:  You have increasing pain or  severe headaches.  You have nausea, vomiting, or drowsiness.  You have swelling around your face.  You have vision problems.  You have a stiff neck.  You have difficulty breathing. MAKE SURE YOU:   Understand these instructions.  Will watch your condition.  Will get help right away if you are not doing well or get worse. Document Released: 12/27/2004 Document Revised: 03/21/2011 Document Reviewed: 01/11/2011 Exit[employer] Patient Information 2014 [employer].   

## 2013-11-05 NOTE — Progress Notes (Signed)
Patient presents to clinic today c/o 5 days of sore throat, cough, nasal/head congestions, sinus pain, fatigue and body aches.  Denies ear pain, tooth pain, fever or chills.  Denies recent travel or sick contact. Has had flu shot this year.  Past Medical History  Diagnosis Date  . Hypertension   . Headache(784.0)     Chronic migraines--Dr Lewitt  . Meckel's diverticulum 08/2004    with ischemia  . 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 Uterine adhesions   . Sacroiliac dysfunction 06/23/2012    Current Outpatient Prescriptions on File Prior to Visit  Medication Sig Dispense Refill  . chlorzoxazone (PARAFON) 500 MG tablet Take 500 mg by mouth as needed.        . cyclobenzaprine (FLEXERIL) 10 MG tablet Take 1 tablet (10 mg total) by mouth at bedtime as needed for muscle spasms.  30 tablet  1  . eletriptan (RELPAX) 40 MG tablet One tablet by mouth as needed for migraine headache.  If the headache improves and then returns, dose may be repeated after 2 hours have elapsed since first dose (do not exceed 80 mg per day). may repeat in 2 hours if necessary       . ketoprofen (ORUDIS) 75 MG capsule Take 1 tablet as needed for headaches.       . meloxicam (MOBIC) 7.5 MG tablet Take 1 tablet (7.5 mg total) by mouth daily.  15 tablet  0  . metaxalone (SKELAXIN) 800 MG tablet Take 800 mg by mouth 3 (three) times daily as needed.       Marland Kitchen oxyCODONE-acetaminophen (PERCOCET) 5-325 MG per tablet Take 1 tablet by mouth daily as needed. For pain.        No current facility-administered medications on file prior to visit.    No Known Allergies  Family History  Problem Relation Age of Onset  . Cancer Neg Hx   . Hypertension Other     History   Social History  . Marital Status: Single    Spouse Name: N/A    Number of Children: 3  . Years of Education: N/A   Occupational History  . Cashier    Social History Main Topics  . Smoking status: Never Smoker   . Smokeless tobacco: Never Used  . Alcohol  Use: Yes     Comment: social  . Drug Use: None  . Sexual Activity: None   Other Topics Concern  . None   Social History Narrative  . None   Review of Systems - See HPI.  All other ROS are negative.  BP 147/94  Pulse 79  Temp(Src) 98.7 F (37.1 C) (Oral)  Resp 16  Ht 5\' 3"  (1.6 m)  Wt 171 lb 2 oz (77.622 kg)  BMI 30.32 kg/m2  SpO2 99%  LMP 11/10/2005  Physical Exam  Vitals reviewed. Constitutional: She is oriented to person, place, and time and well-developed, well-nourished, and in no distress.  HENT:  Head: Normocephalic and atraumatic.  Right Ear: Tympanic membrane, external ear and ear canal normal.  Left Ear: Tympanic membrane, external ear and ear canal normal.  Nose: Mucosal edema present. Right sinus exhibits frontal sinus tenderness. Right sinus exhibits no maxillary sinus tenderness. Left sinus exhibits no maxillary sinus tenderness and no frontal sinus tenderness.  Mouth/Throat: Uvula is midline, oropharynx is clear and moist and mucous membranes are normal.  Eyes: Conjunctivae are normal. Pupils are equal, round, and reactive to light.  Neck: Neck supple.  Cardiovascular:  Normal rate, regular rhythm, normal heart sounds and intact distal pulses.   Pulmonary/Chest: Effort normal and breath sounds normal. No respiratory distress. She has no wheezes. She has no rales. She exhibits no tenderness.  Lymphadenopathy:    She has no cervical adenopathy.  Neurological: She is alert and oriented to person, place, and time.  Skin: Skin is warm and dry. No rash noted.  Psychiatric: Affect normal.   Assessment/Plan: Acute frontal sinusitis Rx Augmentin.  Increase fluids.  Rest.  Saline nasal spray.  Probiotic.  Mucinex as directed.  Humidifier in bedroom.  Call or return to clinic if symptoms are not improving.

## 2013-11-05 NOTE — Assessment & Plan Note (Signed)
Rx Augmentin.  Increase fluids.  Rest.  Saline nasal spray.  Probiotic.  Mucinex as directed.  Humidifier in bedroom.  Call or return to clinic if symptoms are not improving.  

## 2013-11-06 LAB — POCT INFLUENZA A/B
Influenza A, POC: NEGATIVE
Influenza B, POC: NEGATIVE

## 2013-11-06 NOTE — Addendum Note (Signed)
Addended by: Rockwell Germany on: 11/06/2013 02:03 PM   Modules accepted: Orders

## 2014-02-12 ENCOUNTER — Other Ambulatory Visit: Payer: Self-pay | Admitting: Physician Assistant

## 2014-02-12 NOTE — Telephone Encounter (Signed)
Rx request to pharmacy/SLS  

## 2014-04-02 ENCOUNTER — Ambulatory Visit: Payer: Managed Care, Other (non HMO) | Admitting: Family

## 2014-04-08 ENCOUNTER — Other Ambulatory Visit: Payer: Self-pay | Admitting: Physician Assistant

## 2014-04-09 ENCOUNTER — Ambulatory Visit (INDEPENDENT_AMBULATORY_CARE_PROVIDER_SITE_OTHER): Payer: Managed Care, Other (non HMO) | Admitting: Physician Assistant

## 2014-04-09 ENCOUNTER — Ambulatory Visit (HOSPITAL_BASED_OUTPATIENT_CLINIC_OR_DEPARTMENT_OTHER)
Admission: RE | Admit: 2014-04-09 | Discharge: 2014-04-09 | Disposition: A | Discharge status: Home / Self Care | Payer: Managed Care, Other (non HMO) | Source: Ambulatory Visit | Attending: Physician Assistant | Admitting: Physician Assistant

## 2014-04-09 ENCOUNTER — Encounter: Payer: Self-pay | Admitting: Physician Assistant

## 2014-04-09 ENCOUNTER — Telehealth: Payer: Self-pay | Admitting: Family

## 2014-04-09 VITALS — BP 129/83 | HR 71 | Temp 98.2°F | Resp 16 | Ht 63.0 in | Wt 165.4 lb

## 2014-04-09 DIAGNOSIS — I10 Essential (primary) hypertension: Secondary | ICD-10-CM | POA: Diagnosis not present

## 2014-04-09 DIAGNOSIS — M25562 Pain in left knee: Secondary | ICD-10-CM | POA: Insufficient documentation

## 2014-04-09 MED ORDER — HYDROCHLOROTHIAZIDE 25 MG PO TABS: 25.0000 mg | ORAL_TABLET | Freq: Every day | ORAL | Status: DC

## 2014-04-09 MED ORDER — NEBIVOLOL HCL 5 MG PO TABS: 5.0000 mg | ORAL_TABLET | Freq: Every day | ORAL | Status: DC

## 2014-04-09 MED ORDER — LISINOPRIL 20 MG PO TABS: 20.0000 mg | ORAL_TABLET | Freq: Every day | ORAL | Status: DC

## 2014-04-09 MED ORDER — MELOXICAM 15 MG PO TABS: 15.0000 mg | ORAL_TABLET | Freq: Every day | ORAL | Status: DC

## 2014-04-09 NOTE — Telephone Encounter (Signed)
Refill re-sent  

## 2014-04-09 NOTE — Telephone Encounter (Signed)
Caller name: sarah from [facility] Relation to pt: Call back number: [phone] Pharmacy:  Reason for call:   Is needing [place] to resubmit electronically the lisinopril refill that was sent in today. The rx did not go through corrrectly

## 2014-04-09 NOTE — Assessment & Plan Note (Signed)
Stable. Well controlled. We'll give one month refill. Further refills will be at the discretion of her PCP.

## 2014-04-09 NOTE — Assessment & Plan Note (Signed)
Rx Mobic daily with food. Tylenol for breakthrough pain. Encouraged knee brace daily. RICE therapy. Will obtain x-ray to further assess.

## 2014-04-09 NOTE — Patient Instructions (Signed)
Please go downstairs for imaging.  I will call you with your results. Continue bracing your knee.  Elevate knee while resting.  Avoid heavy lifting. Take the Mobic daily with food.  Use Tylenol for any breakthrough pain. Apply topical Aspercreme to the knee.  We may have to proceed with MRI or referral to Sports Medicine.

## 2014-04-09 NOTE — Progress Notes (Signed)
Patient presents to clinic today c/o 4 weeks of gradually worsening left knee pain, most prominent with ambulation. Patient denies trauma or injury. Denies redness or swelling. Denies buckling of the knee. Pain is mostly lateral and medial. Patient has not taken anything for her symptoms.  Patient also requesting refills of her blood pressure medications. Is well overdue for follow-up with her PCP. Endorses taking medications daily as directed. Patient denies chest pain, palpitations, lightheadedness, dizziness, vision changes or frequent headaches.   Past Medical History  Diagnosis Date  . Hypertension   . Headache(784.0)     Chronic migraines--Dr Lewitt  . Meckel's diverticulum 08/2004    with ischemia  . 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 Uterine adhesions   . Sacroiliac dysfunction 06/23/2012    Current Outpatient Prescriptions on File Prior to Visit  Medication Sig Dispense Refill  . chlorzoxazone (PARAFON) 500 MG tablet Take 500 mg by mouth as needed.      . cyclobenzaprine (FLEXERIL) 10 MG tablet Take 1 tablet (10 mg total) by mouth at bedtime as needed for muscle spasms. 30 tablet 1  . eletriptan (RELPAX) 40 MG tablet One tablet by mouth as needed for migraine headache.  If the headache improves and then returns, dose may be repeated after 2 hours have elapsed since first dose (do not exceed 80 mg per day). may repeat in 2 hours if necessary     . ketoprofen (ORUDIS) 75 MG capsule Take 1 tablet as needed for headaches.     . metaxalone (SKELAXIN) 800 MG tablet Take 800 mg by mouth 3 (three) times daily as needed.      No current facility-administered medications on file prior to visit.    No Known Allergies  Family History  Problem Relation Age of Onset  . Cancer Neg Hx   . Hypertension Other     History   Social History  . Marital Status: Single    Spouse Name: N/A  . Number of Children: 3  . Years of Education: N/A   Occupational History  . Cashier    Social  History Main Topics  . Smoking status: Never Smoker   . Smokeless tobacco: Never Used  . Alcohol Use: Yes     Comment: social  . Drug Use: Not on file  . Sexual Activity: Not on file   Other Topics Concern  . None   Social History Narrative   Review of Systems - See HPI.  All other ROS are negative.  BP 129/83 mmHg  Pulse 71  Temp(Src) 98.2 F (36.8 C) (Oral)  Resp 16  Ht 5\' 3"  (1.6 m)  Wt 165 lb 6 oz (75.014 kg)  BMI 29.30 kg/m2  SpO2 100%  LMP 11/10/2005  Physical Exam  Constitutional: She is oriented to person, place, and time and well-developed, well-nourished, and in no distress.  HENT:  Head: Normocephalic and atraumatic.  Cardiovascular: Normal rate, regular rhythm, normal heart sounds and intact distal pulses.   Pulmonary/Chest: Effort normal and breath sounds normal. No respiratory distress. She has no wheezes. She has no rales. She exhibits no tenderness.  Musculoskeletal:       Left knee: She exhibits normal range of motion, no swelling, normal alignment, no LCL laxity, normal patellar mobility, normal meniscus and no MCL laxity. Tenderness found. Medial joint line and lateral joint line tenderness noted. No MCL, no LCL and no patellar tendon tenderness noted.  Neurological: She is alert and oriented to person,  place, and time.  Skin: Skin is warm and dry. No rash noted.  Psychiatric: Affect normal.  Vitals reviewed.  Assessment/Plan: Essential hypertension Stable. Well controlled. We'll give one month refill. Further refills will be at the discretion of her PCP.   Left knee pain Rx Mobic daily with food. Tylenol for breakthrough pain. Encouraged knee brace daily. RICE therapy. Will obtain x-ray to further assess.

## 2014-04-09 NOTE — Progress Notes (Signed)
Pre visit review using our clinic review tool, if applicable. No additional management support is needed unless otherwise documented below in the visit note/SLS  

## 2014-04-30 ENCOUNTER — Other Ambulatory Visit: Payer: Self-pay | Admitting: Dermatology

## 2014-05-12 ENCOUNTER — Telehealth: Payer: Self-pay | Admitting: Family

## 2014-05-12 DIAGNOSIS — I10 Essential (primary) hypertension: Secondary | ICD-10-CM

## 2014-05-12 MED ORDER — HYDROCHLOROTHIAZIDE 25 MG PO TABS: 25.0000 mg | ORAL_TABLET | Freq: Every day | ORAL | Status: DC

## 2014-05-12 MED ORDER — NEBIVOLOL HCL 5 MG PO TABS: 5.0000 mg | ORAL_TABLET | Freq: Every day | ORAL | Status: DC

## 2014-05-12 MED ORDER — LISINOPRIL 20 MG PO TABS: 20.0000 mg | ORAL_TABLET | Freq: Every day | ORAL | Status: DC

## 2014-05-12 NOTE — Telephone Encounter (Signed)
30 day supply of each medication sent to pharmacy. Should we bring pt in for follow up or CPE in the next 30 days?

## 2014-05-12 NOTE — Telephone Encounter (Signed)
Caller Name: Pola Furno Relation to Patient: self Phone #: [phone] (cell) Pharmacy: [facility] Reason for Call: Has 10 days of each medication left. Please have refills sent for her.  hydrochlorothiazide (HYDRODIURIL) 25 MG tablet [[id number]] BYSTOLIC 5 MG tablet [[id number]] lisinopril (PRINIVIL,ZESTRIL) 20 MG tablet [[id number]]

## 2014-05-12 NOTE — Telephone Encounter (Signed)
Follow up please

## 2014-05-13 NOTE — Telephone Encounter (Signed)
Informed patient of med refill and appointment scheduled for 06/04/14

## 2014-05-13 NOTE — Telephone Encounter (Signed)
Please call pt to arrange f/u within 30 days.

## 2014-06-04 ENCOUNTER — Ambulatory Visit: Payer: Managed Care, Other (non HMO) | Admitting: Family

## 2014-06-04 ENCOUNTER — Other Ambulatory Visit: Payer: Self-pay | Admitting: Family Medicine

## 2014-06-04 ENCOUNTER — Other Ambulatory Visit: Payer: Self-pay | Admitting: Physician Assistant

## 2014-06-04 NOTE — Telephone Encounter (Signed)
I have refilled the Bystolic for a couple months. But just want [place] to clarify who she sees as a primary. [name] is listed but she has bounced around since Dr Shawna Orleans left so jus tthought we should confirm.

## 2014-06-11 ENCOUNTER — Encounter: Payer: Self-pay | Admitting: Family

## 2014-06-11 ENCOUNTER — Ambulatory Visit (INDEPENDENT_AMBULATORY_CARE_PROVIDER_SITE_OTHER): Payer: Managed Care, Other (non HMO) | Admitting: Family

## 2014-06-11 ENCOUNTER — Telehealth: Payer: Self-pay | Admitting: Family

## 2014-06-11 VITALS — BP 118/80 | HR 54 | Temp 98.0°F | Resp 16 | Ht 63.0 in | Wt 171.6 lb

## 2014-06-11 DIAGNOSIS — M25562 Pain in left knee: Secondary | ICD-10-CM

## 2014-06-11 DIAGNOSIS — I10 Essential (primary) hypertension: Secondary | ICD-10-CM | POA: Diagnosis not present

## 2014-06-11 DIAGNOSIS — G43809 Other migraine, not intractable, without status migrainosus: Secondary | ICD-10-CM

## 2014-06-11 DIAGNOSIS — E876 Hypokalemia: Secondary | ICD-10-CM

## 2014-06-11 LAB — BASIC METABOLIC PANEL
BUN: 16 mg/dL (ref 6–23)
CO2: 30 mEq/L (ref 19–32)
Calcium: 9.2 mg/dL (ref 8.4–10.5)
Chloride: 101 mEq/L (ref 96–112)
Creatinine, Ser: 0.84 mg/dL (ref 0.40–1.20)
GFR: 92.46 mL/min (ref >60.00)
Glucose, Bld: 85 mg/dL (ref 70–99)
Potassium: 3 mEq/L — ABNORMAL LOW (ref 3.5–5.1)
Sodium: 136 mEq/L (ref 135–145)

## 2014-06-11 MED ORDER — HYDROCHLOROTHIAZIDE 25 MG PO TABS: ORAL_TABLET | ORAL | Status: DC

## 2014-06-11 MED ORDER — NEBIVOLOL HCL 5 MG PO TABS: 5.0000 mg | ORAL_TABLET | Freq: Every day | ORAL | Status: DC

## 2014-06-11 MED ORDER — ELETRIPTAN HYDROBROMIDE 40 MG PO TABS: 40.0000 mg | ORAL_TABLET | ORAL | Status: DC | PRN

## 2014-06-11 MED ORDER — LISINOPRIL 20 MG PO TABS: 20.0000 mg | ORAL_TABLET | Freq: Every day | ORAL | Status: DC

## 2014-06-11 MED ORDER — POTASSIUM CHLORIDE CRYS ER 20 MEQ PO TBCR: 20.0000 meq | EXTENDED_RELEASE_TABLET | Freq: Every day | ORAL | Status: DC

## 2014-06-11 NOTE — Progress Notes (Signed)
Subjective:    Patient ID: [name], female    DOB: 1965/09/21, 49 y.o.   MRN: [id number]  HPI  [name] is a 49 yr old female who presents today for follow up of her HTN.  Patient is currently maintained on the following medications for blood pressure: hctz, lisinopril, bystolic Patient reports good compliance with blood pressure medications. Patient denies chest pain, shortness of breath or swelling. Last 3 blood pressure readings in our office are as follows: BP Readings from Last 3 Encounters:  06/11/14 118/80  04/09/14 129/83  11/05/13 147/94   Migraines- reports that her migraines have been well controlled.   Left knee pain- saw Elyn Aquas PA-C for this back in March.  Reports that pain is ongoing. Uses a soft knee brace during the day which helps some.  X ray performed at that time was unremarkable.    Review of Systems See HPI  Past Medical History  Diagnosis Date  . Hypertension   . Headache(784.0)     Chronic migraines--Dr Lewitt  . Meckel's diverticulum 08/2004    with ischemia  . 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 Uterine adhesions   . Sacroiliac dysfunction 06/23/2012    History   Social History  . Marital Status: Single    Spouse Name: N/A  . Number of Children: 3  . Years of Education: N/A   Occupational History  . Cashier    Social History Main Topics  . Smoking status: Never Smoker   . Smokeless tobacco: Never Used  . Alcohol Use: Yes     Comment: social  . Drug Use: Not on file  . Sexual Activity: Not on file   Other Topics Concern  . Not on file   Social History Narrative    Past Surgical History  Procedure Laterality Date  . Laparoscopy  11/2005    total laparoscopy  . Laparoscopic salpingoopherectomy  11/2005    right-- assisted with daVinci robot, and lysis of omental adhesions.  . Abdominal hysterectomy  11/2005  . Other surgical history      Meckel's diverticulectomy, small bowel resection and resection of retrocecal  appendix.  . Bowel reconstruction      Family History  Problem Relation Age of Onset  . Cancer Neg Hx   . Hypertension Other     No Known Allergies  Current Outpatient Prescriptions on File Prior to Visit  Medication Sig Dispense Refill  . chlorzoxazone (PARAFON) 500 MG tablet Take 500 mg by mouth as needed.      . cyclobenzaprine (FLEXERIL) 10 MG tablet Take 1 tablet (10 mg total) by mouth at bedtime as needed for muscle spasms. 30 tablet 1  . metaxalone (SKELAXIN) 800 MG tablet Take 800 mg by mouth 3 (three) times daily as needed.      No current facility-administered medications on file prior to visit.    BP 118/80 mmHg  Pulse 54  Temp(Src) 98 F (36.7 C) (Oral)  Resp 16  Ht 5\' 3"  (1.6 m)  Wt 171 lb 9.6 oz (77.837 kg)  BMI 30.41 kg/m2  SpO2 99%  LMP 11/10/2005       Objective:   Physical Exam  Constitutional: She is oriented to person, place, and time. She appears well-developed and well-nourished.  Cardiovascular: Normal rate, regular rhythm and normal heart sounds.   No murmur heard. Pulmonary/Chest: Effort normal and breath sounds normal. No respiratory distress. She has no wheezes.  Musculoskeletal:  Left knee is  in soft knee brace.   Neurological: She is alert and oriented to person, place, and time.  Psychiatric: She has a normal mood and affect. Her behavior is normal. Judgment and thought content normal.          Assessment & Plan:

## 2014-06-11 NOTE — Assessment & Plan Note (Signed)
Unchanged, will refer to Dr. Barbaraann Barthel for further evaluation.

## 2014-06-11 NOTE — Telephone Encounter (Signed)
Notified pt and she voices understanding. Lab appt scheduled for 06/18/14 at 8:45am. Future order entered.

## 2014-06-11 NOTE — Assessment & Plan Note (Signed)
BP stable on current meds. Continue same. Obtain bmet.

## 2014-06-11 NOTE — Progress Notes (Signed)
Pre visit review using our clinic review tool, if applicable. No additional management support is needed unless otherwise documented below in the visit note. 

## 2014-06-11 NOTE — Telephone Encounter (Signed)
Potassium is low. Take K dur 20 mEQ two tabs by mouth today, then one tab by mouth once daily. Repeat bmet in 1 week, dx hypokalemia.

## 2014-06-11 NOTE — Patient Instructions (Addendum)
You will be contacted about your referral to Dr. Barbaraann Barthel. Please complete lab work prior to leaving.  Please schedule a complete physical at the front desk.

## 2014-06-11 NOTE — Assessment & Plan Note (Signed)
Migraines have been well controlled.

## 2014-06-18 ENCOUNTER — Other Ambulatory Visit (INDEPENDENT_AMBULATORY_CARE_PROVIDER_SITE_OTHER): Payer: Managed Care, Other (non HMO)

## 2014-06-18 ENCOUNTER — Ambulatory Visit (INDEPENDENT_AMBULATORY_CARE_PROVIDER_SITE_OTHER): Payer: Managed Care, Other (non HMO) | Admitting: Family Medicine

## 2014-06-18 DIAGNOSIS — E876 Hypokalemia: Secondary | ICD-10-CM | POA: Diagnosis not present

## 2014-06-18 DIAGNOSIS — M25562 Pain in left knee: Secondary | ICD-10-CM | POA: Diagnosis not present

## 2014-06-18 LAB — BASIC METABOLIC PANEL
BUN: 15 mg/dL (ref 6–23)
CO2: 30 mEq/L (ref 19–32)
Calcium: 9.1 mg/dL (ref 8.4–10.5)
Chloride: 104 mEq/L (ref 96–112)
Creatinine, Ser: 0.8 mg/dL (ref 0.40–1.20)
GFR: 97.81 mL/min (ref >60.00)
Glucose, Bld: 87 mg/dL (ref 70–99)
Potassium: 3.3 mEq/L — ABNORMAL LOW (ref 3.5–5.1)
Sodium: 136 mEq/L (ref 135–145)

## 2014-06-18 NOTE — Patient Instructions (Signed)
Your knee pain is either due to arthritis (more than is seen on the x-rays) or a degenerative medial meniscus tear. Both are treated similarly initially. These are the four classes of medicine you can [place]: Tylenol 500mg  1-2 tabs three times a day for pain. Ibuprofen 600mg  three times a day with food OR Aleve 1-2 tabs twice a day with food for pain and inflammation. Glucosamine sulfate 750mg  twice a day is a supplement that may help. Capsaicin, aspercreme, or biofreeze topically up to four times a day may also help with pain. Cortisone injections are an option. It's important that you continue to stay active. Straight leg raises, knee extensions 3 sets of 10 once a day (add ankle weight if these become too easy). Consider physical therapy to strengthen muscles around the joint that hurts to take pressure off of the joint itself. Shoe inserts with good arch support may be helpful. Heat or ice 15 minutes at a time 3-4 times a day as needed to help with pain. MRI is another consideration.

## 2014-06-19 ENCOUNTER — Telehealth: Payer: Self-pay | Admitting: Family

## 2014-06-19 DIAGNOSIS — E876 Hypokalemia: Secondary | ICD-10-CM

## 2014-06-19 NOTE — Progress Notes (Signed)
PCP and referred by: Nance Pear., NP  Subjective:   HPI: Patient is a 49 y.o. female here for left knee pain.  Patient denies known injury or trauma. She states over past several months she's had worsening left knee pain on sides of knee and back of knee. Pain is sharp. Worse with extending, standing, kneeling. Works on [employer] a lot at work bending and lifting. No swelling or bruising. Radiographs negative in March including no evidence DJD. Feels like it wants to give out at times. No catching, locking.  Past Medical History  Diagnosis Date  . Hypertension   . Headache(784.0)     Chronic migraines--Dr Lewitt  . Meckel's diverticulum 08/2004    with ischemia  . 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 Uterine adhesions   . Sacroiliac dysfunction 06/23/2012    Current Outpatient Prescriptions on File Prior to Visit  Medication Sig Dispense Refill  . chlorzoxazone (PARAFON) 500 MG tablet Take 500 mg by mouth as needed.      . cyclobenzaprine (FLEXERIL) 10 MG tablet Take 1 tablet (10 mg total) by mouth at bedtime as needed for muscle spasms. 30 tablet 1  . eletriptan (RELPAX) 40 MG tablet Take 1 tablet (40 mg total) by mouth as needed. may repeat in 2 hours if necessary 10 tablet 5  . flurbiprofen (ANSAID) 100 MG tablet Take 100 mg by mouth 2 (two) times daily.    . hydrochlorothiazide (HYDRODIURIL) 25 MG tablet TAKE 1 TABLET (25 MG TOTAL) BY MOUTH DAILY. 90 tablet 1  . lisinopril (PRINIVIL,ZESTRIL) 20 MG tablet Take 1 tablet (20 mg total) by mouth daily. 90 tablet 1  . metaxalone (SKELAXIN) 800 MG tablet Take 800 mg by mouth 3 (three) times daily as needed.     . nebivolol (BYSTOLIC) 5 MG tablet Take 1 tablet (5 mg total) by mouth daily. 90 tablet 1  . potassium chloride SA (K-DUR,KLOR-CON) 20 MEQ tablet Take 1 tablet (20 mEq total) by mouth daily. 30 tablet 3   No current facility-administered medications on file prior to visit.    Past Surgical History  Procedure Laterality  Date  . Laparoscopy  11/2005    total laparoscopy  . Laparoscopic salpingoopherectomy  11/2005    right-- assisted with daVinci robot, and lysis of omental adhesions.  . Abdominal hysterectomy  11/2005  . Other surgical history      Meckel's diverticulectomy, small bowel resection and resection of retrocecal appendix.  . Bowel reconstruction      No Known Allergies  History   Social History  . Marital Status: Single    Spouse Name: N/A  . Number of Children: 3  . Years of Education: N/A   Occupational History  . Cashier    Social History Main Topics  . Smoking status: Never Smoker   . Smokeless tobacco: Never Used  . Alcohol Use: Yes     Comment: social  . Drug Use: Not on file  . Sexual Activity: Not on file   Other Topics Concern  . Not on file   Social History Narrative    Family History  Problem Relation Age of Onset  . Cancer Neg Hx   . Hypertension Other     LMP 11/10/2005  Review of Systems: See HPI above.    Objective:  Physical Exam:  Gen: NAD  Left knee: No gross deformity, ecchymoses, effusion. Medial joint line tenderness.  No other tenderness. FROM. Negative ant/post drawers. Negative valgus/varus testing. Negative lachmanns. Negative  mcmurrays, apleys, patellar apprehension, thessalys. NV intact distally.    Assessment & Plan:  1. Left knee pain - radiographs negative.  Pain due to DJD not seen on radiographs vs degenerative medial meniscus tear.  Discussed tylenol, nsaids, glucosamine, topical medications.  Consider injection.  Home exercises reviewed.  Heat/ice as needed.  F/u in 1 month or prn.

## 2014-06-19 NOTE — Addendum Note (Signed)
Addended by: Sherrie George F on: 06/19/2014 11:53 AM   Modules accepted: Orders

## 2014-06-19 NOTE — Telephone Encounter (Signed)
Potassium is still low. Increase kdur to 1 tab twice daily. Repeat bmet in 1 week. Dx hypokalemia.

## 2014-06-19 NOTE — Assessment & Plan Note (Signed)
radiographs negative.  Pain due to DJD not seen on radiographs vs degenerative medial meniscus tear.  Discussed tylenol, nsaids, glucosamine, topical medications.  Consider injection.  Home exercises reviewed.  Heat/ice as needed.  F/u in 1 month or prn.

## 2014-06-20 NOTE — Telephone Encounter (Signed)
Notified pt and she voices understanding. Lab appt scheduled for 06/25/14 and she has to have a Wednesday. Lab order entered.

## 2014-06-25 ENCOUNTER — Other Ambulatory Visit (INDEPENDENT_AMBULATORY_CARE_PROVIDER_SITE_OTHER): Payer: Managed Care, Other (non HMO)

## 2014-06-25 DIAGNOSIS — E876 Hypokalemia: Secondary | ICD-10-CM

## 2014-06-25 LAB — BASIC METABOLIC PANEL
BUN: 14 mg/dL (ref 6–23)
CO2: 30 mEq/L (ref 19–32)
Calcium: 9.4 mg/dL (ref 8.4–10.5)
Chloride: 105 mEq/L (ref 96–112)
Creatinine, Ser: 0.81 mg/dL (ref 0.40–1.20)
GFR: 96.41 mL/min (ref >60.00)
Glucose, Bld: 83 mg/dL (ref 70–99)
Potassium: 3.8 mEq/L (ref 3.5–5.1)
Sodium: 137 mEq/L (ref 135–145)

## 2014-06-28 ENCOUNTER — Ambulatory Visit (HOSPITAL_BASED_OUTPATIENT_CLINIC_OR_DEPARTMENT_OTHER): Payer: Managed Care, Other (non HMO)

## 2014-07-03 ENCOUNTER — Telehealth: Payer: Self-pay | Admitting: Family

## 2014-07-03 NOTE — Telephone Encounter (Signed)
Pre visit letter mailed 07/02/14 °

## 2014-07-19 ENCOUNTER — Ambulatory Visit (HOSPITAL_BASED_OUTPATIENT_CLINIC_OR_DEPARTMENT_OTHER): Payer: Managed Care, Other (non HMO)

## 2014-07-22 ENCOUNTER — Telehealth: Payer: Self-pay | Admitting: Behavioral Health

## 2014-07-22 ENCOUNTER — Encounter: Payer: Self-pay | Admitting: Behavioral Health

## 2014-07-22 NOTE — Telephone Encounter (Signed)
Pre-Visit Call completed with patient and chart updated.   Pre-Visit Info documented in Specialty Comments under [employer].    

## 2014-07-23 ENCOUNTER — Encounter: Payer: Self-pay | Admitting: Family

## 2014-07-23 ENCOUNTER — Ambulatory Visit (INDEPENDENT_AMBULATORY_CARE_PROVIDER_SITE_OTHER): Payer: Managed Care, Other (non HMO) | Admitting: Family

## 2014-07-23 VITALS — BP 108/60 | HR 62 | Temp 98.1°F | Resp 18 | Ht 63.0 in | Wt 174.6 lb

## 2014-07-23 DIAGNOSIS — Z0000 Encounter for general adult medical examination without abnormal findings: Secondary | ICD-10-CM | POA: Diagnosis not present

## 2014-07-23 LAB — CBC WITH DIFFERENTIAL/PLATELET
Basophils Absolute: 0 10*3/uL (ref 0.0–0.1)
Basophils Relative: 0.4 % (ref 0.0–3.0)
Eosinophils Absolute: 0.1 10*3/uL (ref 0.0–0.7)
Eosinophils Relative: 2.3 % (ref 0.0–5.0)
HCT: 39.2 % (ref 36.0–46.0)
Hemoglobin: 13.5 g/dL (ref 12.0–15.0)
Lymphocytes Relative: 20.8 % (ref 12.0–46.0)
Lymphs Abs: 0.9 10*3/uL (ref 0.7–4.0)
MCHC: 34.3 g/dL (ref 30.0–36.0)
MCV: 87.1 fl (ref 78.0–100.0)
Monocytes Absolute: 0.5 10*3/uL (ref 0.1–1.0)
Monocytes Relative: 12.1 % — ABNORMAL HIGH (ref 3.0–12.0)
Neutro Abs: 2.9 10*3/uL (ref 1.4–7.7)
Neutrophils Relative %: 64.4 % (ref 43.0–77.0)
Platelets: 255 10*3/uL (ref 150.0–400.0)
RBC: 4.5 Mil/uL (ref 3.87–5.11)
RDW: 13.2 % (ref 11.5–15.5)
WBC: 4.5 10*3/uL (ref 4.0–10.5)

## 2014-07-23 LAB — HEPATIC FUNCTION PANEL
ALT: 15 U/L (ref 0–35)
AST: 21 U/L (ref 0–37)
Albumin: 3.9 g/dL (ref 3.5–5.2)
Alkaline Phosphatase: 49 U/L (ref 39–117)
Bilirubin, Direct: 0.2 mg/dL (ref 0.0–0.3)
Total Bilirubin: 0.8 mg/dL (ref 0.2–1.2)
Total Protein: 7 g/dL (ref 6.0–8.3)

## 2014-07-23 LAB — URINALYSIS, ROUTINE W REFLEX MICROSCOPIC
Bilirubin Urine: NEGATIVE
Ketones, ur: NEGATIVE
Leukocytes, UA: NEGATIVE
Nitrite: NEGATIVE
Specific Gravity, Urine: 1.02 (ref 1.000–1.030)
Total Protein, Urine: NEGATIVE
Urine Glucose: NEGATIVE
Urobilinogen, UA: 0.2 (ref 0.0–1.0)
pH: 6 (ref 5.0–8.0)

## 2014-07-23 LAB — LIPID PANEL
Cholesterol: 185 mg/dL (ref 0–200)
HDL: 43.7 mg/dL (ref >39.00)
LDL Cholesterol: 127 mg/dL — ABNORMAL HIGH (ref 0–99)
NonHDL: 141.3
Total CHOL/HDL Ratio: 4
Triglycerides: 74 mg/dL (ref 0.0–149.0)
VLDL: 14.8 mg/dL (ref 0.0–40.0)

## 2014-07-23 LAB — TSH: TSH: 0.7 u[IU]/mL (ref 0.35–4.50)

## 2014-07-23 NOTE — Patient Instructions (Signed)
Continue work on [employer], exercise, weight loss. Complete lab work prior to leaving.  You will be contacted about your referral for colonoscopy.  Please let us know if you have not heard back within 1 week about your referral.

## 2014-07-23 NOTE — Progress Notes (Signed)
Subjective:    Patient ID: [name], female    DOB: 28-Jan-1965, 49 y.o.   MRN: [id number]  HPI  [name] is a 49 yr old female who presents today for cpx.  Immunizations: tetanus 2010 Diet: reports healthy diet. But too much bread Exercise: walks regularly1.5 hrs/day Colonoscopy:  Due in november Pap Smear: hysterectomy Mammogram: due 10/16 Vision- wears reading glasses, gets eyes checked once a year Dental- goes every 4 months for cleaning  Wt Readings from Last 3 Encounters:  07/23/14 174 lb 9.6 oz (79.198 kg)  06/11/14 171 lb 9.6 oz (77.837 kg)  04/09/14 165 lb 6 oz (75.014 kg)     Review of Systems  Constitutional: Negative for unexpected weight change.  HENT: Negative for hearing loss and rhinorrhea.   Eyes: Negative for visual disturbance.  Respiratory: Negative for cough and shortness of breath.   Cardiovascular: Negative for leg swelling.  Gastrointestinal: Negative for nausea, diarrhea and constipation.  Genitourinary: Negative for dysuria and frequency.  Musculoskeletal: Negative for myalgias and arthralgias.  Skin: Negative for rash.  Neurological: Negative for headaches.  Hematological: Negative for adenopathy.  Psychiatric/Behavioral: Negative for dysphoric mood and agitation.   Past Medical History  Diagnosis Date  . Hypertension   . Headache(784.0)     Chronic migraines--Dr Lewitt  . Meckel's diverticulum 08/2004    with ischemia  . 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 Uterine adhesions   . Sacroiliac dysfunction 06/23/2012    History   Social History  . Marital Status: Single    Spouse Name: N/A  . Number of Children: 3  . Years of Education: N/A   Occupational History  . Cashier    Social History Main Topics  . Smoking status: Never Smoker   . Smokeless tobacco: Never Used  . Alcohol Use: Yes     Comment: social  . Drug Use: Not on file  . Sexual Activity: Not on file   Other Topics Concern  . Not on file   Social History  Narrative   Lives with daughter    Daughter- lives in [place]- lives locally   Works at [employer]   Completed HS   No pets   Enjoys walking, mo   vies, Psychologist, clinical, bowling    Past Surgical History  Procedure Laterality Date  . Laparoscopy  11/2005    total laparoscopy  . Laparoscopic salpingoopherectomy  11/2005    right-- assisted with daVinci robot, and lysis of omental adhesions.  . Abdominal hysterectomy  11/2005  . Other surgical history      Meckel's diverticulectomy, small bowel resection and resection of retrocecal appendix.  . Bowel reconstruction      Family History  Problem Relation Age of Onset  . Cancer Neg Hx   . Hypertension Other     No Known Allergies  Current Outpatient Prescriptions on File Prior to Visit  Medication Sig Dispense Refill  . cyclobenzaprine (FLEXERIL) 10 MG tablet Take 1 tablet (10 mg total) by mouth at bedtime as needed for muscle spasms. 30 tablet 1  . eletriptan (RELPAX) 40 MG tablet Take 1 tablet (40 mg total) by mouth as needed. may repeat in 2 hours if necessary 10 tablet 5  . hydrochlorothiazide (HYDRODIURIL) 25 MG tablet TAKE 1 TABLET (25 MG TOTAL) BY MOUTH DAILY. 90 tablet 1  . lisinopril (PRINIVIL,ZESTRIL) 20 MG tablet Take 1 tablet (20 mg total) by mouth daily. 90 tablet 1  . nebivolol (BYSTOLIC)  5 MG tablet Take 1 tablet (5 mg total) by mouth daily. 90 tablet 1  . potassium chloride SA (K-DUR,KLOR-CON) 20 MEQ tablet Take 1 tablet (20 mEq total) by mouth daily. (Patient taking differently: Take 20 mEq by mouth 2 (two) times daily. ) 30 tablet 3   No current facility-administered medications on file prior to visit.    BP 108/60 mmHg  Pulse 62  Temp(Src) 98.1 F (36.7 C) (Oral)  Resp 18  Ht 5\' 3"  (1.6 m)  Wt 174 lb 9.6 oz (79.198 kg)  BMI 30.94 kg/m2  SpO2 99%  LMP 11/10/2005       Objective:   Physical Exam  Physical Exam  Constitutional: She is oriented to person, place, and time. She appears well-developed and  well-nourished. No distress.  HENT:  Head: Normocephalic and atraumatic.  Right Ear: Tympanic membrane and ear canal normal.  Left Ear: Tympanic membrane and ear canal normal.  Mouth/Throat: Oropharynx is clear and moist.  Eyes: Pupils are equal, round, and reactive to light. No scleral icterus.  Neck: Normal range of motion. No thyromegaly present.  Cardiovascular: Normal rate and regular rhythm.   No murmur heard. Pulmonary/Chest: Effort normal and breath sounds normal. No respiratory distress. He has no wheezes. She has no rales. She exhibits no tenderness.  Abdominal: Soft. Bowel sounds are normal. He exhibits no distension and no mass. There is no tenderness. There is no rebound and no guarding.  Musculoskeletal: She exhibits no edema.  Lymphadenopathy:    She has no cervical adenopathy.  Neurological: She is alert and oriented to person, place, and time. She has normal reflexes. She exhibits normal muscle tone. Coordination normal.  Skin: Skin is warm and dry.  Psychiatric: She has a normal mood and affect. Her behavior is normal. Judgment and thought content normal.  Breasts: Examined lying Right: Without masses, retractions, discharge or axillary adenopathy.  Left: Without masses, retractions, discharge or axillary adenopathy.  Pelvic: deferred.          Assessment & Plan:        Assessment & Plan:  EKG tracing is personally reviewed.  EKG notes NSR.  No acute changes.

## 2014-07-23 NOTE — Progress Notes (Signed)
Pre visit review using our clinic review tool, if applicable. No additional management support is needed unless otherwise documented below in the visit note. 

## 2014-07-24 ENCOUNTER — Encounter: Payer: Self-pay | Admitting: Family

## 2014-07-25 DIAGNOSIS — Z0000 Encounter for general adult medical examination without abnormal findings: Secondary | ICD-10-CM | POA: Insufficient documentation

## 2014-07-25 NOTE — Assessment & Plan Note (Addendum)
Immunizations reviewed and up to date.  Continue healthy diet, exercise.  Obtain routine labs. Refer for colo.

## 2014-08-27 ENCOUNTER — Ambulatory Visit (INDEPENDENT_AMBULATORY_CARE_PROVIDER_SITE_OTHER): Payer: Managed Care, Other (non HMO) | Admitting: Emergency Medicine

## 2014-08-27 VITALS — BP 132/80 | HR 71 | Temp 98.2°F | Resp 17 | Ht 63.5 in | Wt 172.0 lb

## 2014-08-27 DIAGNOSIS — R300 Dysuria: Secondary | ICD-10-CM | POA: Diagnosis not present

## 2014-08-27 DIAGNOSIS — N3000 Acute cystitis without hematuria: Secondary | ICD-10-CM

## 2014-08-27 LAB — POCT UA - MICROSCOPIC ONLY
Casts, Ur, LPF, POC: NEGATIVE
Crystals, Ur, HPF, POC: NEGATIVE
Mucus, UA: NEGATIVE
Yeast, UA: NEGATIVE

## 2014-08-27 LAB — POCT URINALYSIS DIPSTICK
Bilirubin, UA: NEGATIVE
Blood, UA: NEGATIVE
Glucose, UA: NEGATIVE
Ketones, UA: NEGATIVE
Nitrite, UA: NEGATIVE
Protein, UA: NEGATIVE
Spec Grav, UA: 1.015
Urobilinogen, UA: 0.2
pH, UA: 6.5

## 2014-08-27 MED ORDER — PHENAZOPYRIDINE HCL 200 MG PO TABS: 200.0000 mg | ORAL_TABLET | Freq: Three times a day (TID) | ORAL | Status: DC | PRN

## 2014-08-27 MED ORDER — SULFAMETHOXAZOLE-TRIMETHOPRIM 800-160 MG PO TABS: 1.0000 | ORAL_TABLET | Freq: Two times a day (BID) | ORAL | Status: DC

## 2014-08-27 NOTE — Progress Notes (Signed)
Subjective:  Patient ID: [name], female    DOB: 23-Dec-1965  Age: 49 y.o. MRN: [id number]  CC: Back Pain; Groin Pain; and Dysuria   HPI [name] presents   Is low back pain and dysuria and frequency. She has no fever or chills. No nausea or vomiting. History of prior urinary tract infections. She has no history of back injury or overuse. The pain started this morning  History [place] has a past medical history of Hypertension; Headache(784.0); Meckel's diverticulum (08/2004); Myoma (11/2005); Uterine adhesions; and Sacroiliac dysfunction (06/23/2012).   She has past surgical history that includes laparoscopy (11/2005); Laparoscopic salpingoopherectomy (11/2005); Abdominal hysterectomy (11/2005); Other surgical history; and bowel reconstruction.   Her  family history includes Hypertension in her other. There is no history of Cancer.  She   reports that she has never smoked. She has never used smokeless tobacco. She reports that she drinks alcohol. Her drug history is not on file.  Outpatient Prescriptions Prior to Visit  Medication Sig Dispense Refill  . cyclobenzaprine (FLEXERIL) 10 MG tablet Take 1 tablet (10 mg total) by mouth at bedtime as needed for muscle spasms. 30 tablet 1  . eletriptan (RELPAX) 40 MG tablet Take 1 tablet (40 mg total) by mouth as needed. may repeat in 2 hours if necessary 10 tablet 5  . flurbiprofen (ANSAID) 100 MG tablet Take 100 mg by mouth 2 (two) times daily as needed.    . hydrochlorothiazide (HYDRODIURIL) 25 MG tablet TAKE 1 TABLET (25 MG TOTAL) BY MOUTH DAILY. 90 tablet 1  . lisinopril (PRINIVIL,ZESTRIL) 20 MG tablet Take 1 tablet (20 mg total) by mouth daily. 90 tablet 1  . nebivolol (BYSTOLIC) 5 MG tablet Take 1 tablet (5 mg total) by mouth daily. 90 tablet 1  . potassium chloride SA (K-DUR,KLOR-CON) 20 MEQ tablet Take 1 tablet (20 mEq total) by mouth daily. (Patient taking differently: Take 20 mEq by mouth 2 (two) times daily. ) 30 tablet 3    No facility-administered medications prior to visit.    Social History   Social History  . Marital Status: Single    Spouse Name: N/A  . Number of Children: 3  . Years of Education: N/A   Occupational History  . Cashier    Social History Main Topics  . Smoking status: Never Smoker   . Smokeless tobacco: Never Used  . Alcohol Use: Yes     Comment: social  . Drug Use: None  . Sexual Activity: Not Asked   Other Topics Concern  . None   Social History Narrative   Lives with daughter    Daughter- lives in [place]- lives locally   Works at [employer]   Completed HS   No pets   Enjoys walking, mo   vies, Psychologist, clinical, bowling     Review of Systems  Constitutional: Negative for fever, chills and appetite change.  HENT: Negative for congestion, ear pain, postnasal drip, sinus pressure and sore throat.   Eyes: Negative for pain and redness.  Respiratory: Negative for cough, shortness of breath and wheezing.   Cardiovascular: Negative for leg swelling.  Gastrointestinal: Negative for nausea, vomiting, abdominal pain, diarrhea, constipation and blood in stool.  Endocrine: Negative for polyuria.  Genitourinary: Positive for dysuria. Negative for urgency, frequency and flank pain.  Musculoskeletal: Negative for gait problem.  Skin: Negative for rash.  Neurological: Negative for weakness and headaches.  Psychiatric/Behavioral: Negative for confusion and decreased concentration. The patient is not nervous/anxious.  Objective:  BP 132/80 mmHg  Pulse 71  Temp(Src) 98.2 F (36.8 C) (Oral)  Resp 17  Ht 5' 3.5" (1.613 m)  Wt 172 lb (78.019 kg)  BMI 29.99 kg/m2  SpO2 97%  LMP 11/10/2005  Physical Exam  Constitutional: She is oriented to person, place, and time. She appears well-developed and well-nourished. No distress.  HENT:  Head: Normocephalic and atraumatic.  Right Ear: External ear normal.  Left Ear: External ear normal.  Nose: Nose normal.  Eyes:  Conjunctivae and EOM are normal. Pupils are equal, round, and reactive to light. No scleral icterus.  Neck: Normal range of motion. Neck supple. No tracheal deviation present.  Cardiovascular: Normal rate, regular rhythm and normal heart sounds.   Pulmonary/Chest: Effort normal. No respiratory distress. She has no wheezes. She has no rales.  Abdominal: She exhibits no mass. There is no tenderness. There is no rebound and no guarding.  Musculoskeletal: She exhibits no edema.  Lymphadenopathy:    She has no cervical adenopathy.  Neurological: She is alert and oriented to person, place, and time. Coordination normal.  Skin: Skin is warm and dry. No rash noted.  Psychiatric: She has a normal mood and affect. Her behavior is normal.      Assessment & Plan:   [name] was seen today for back pain, groin pain and dysuria.  Diagnoses and all orders for this visit:  Acute cystitis without hematuria  Dysuria -     POCT UA - Microscopic Only -     POCT urinalysis dipstick  Other orders -     phenazopyridine (PYRIDIUM) 200 MG tablet; Take 1 tablet (200 mg total) by mouth 3 (three) times daily as needed. -     sulfamethoxazole-trimethoprim (BACTRIM DS,SEPTRA DS) 800-160 MG per tablet; Take 1 tablet by mouth 2 (two) times daily.   I am having [name] start on phenazopyridine and sulfamethoxazole-trimethoprim. I am also having her maintain her cyclobenzaprine, eletriptan, nebivolol, lisinopril, hydrochlorothiazide, potassium chloride SA, and flurbiprofen.  Meds ordered this encounter  Medications  . phenazopyridine (PYRIDIUM) 200 MG tablet    Sig: Take 1 tablet (200 mg total) by mouth 3 (three) times daily as needed.    Dispense:  6 tablet    Refill:  0  . sulfamethoxazole-trimethoprim (BACTRIM DS,SEPTRA DS) 800-160 MG per tablet    Sig: Take 1 tablet by mouth 2 (two) times daily.    Dispense:  20 tablet    Refill:  0    Appropriate red flag conditions were discussed with the patient as  well as actions that should be taken.  Patient expressed his understanding.  Follow-up: Return if symptoms worsen or fail to improve.  Roselee Culver, MD   Results for orders placed or performed in visit on 08/27/14  POCT UA - Microscopic Only  Result Value Ref Range   WBC, Ur, HPF, POC 2-6    RBC, urine, microscopic 1-4    Bacteria, U Microscopic trace    Mucus, UA neg    Epithelial cells, urine per micros 1-8    Crystals, Ur, HPF, POC neg    Casts, Ur, LPF, POC neg    Yeast, UA neg   POCT urinalysis dipstick  Result Value Ref Range   Color, UA yellow    Clarity, UA hazy    Glucose, UA neg    Bilirubin, UA neg    Ketones, UA neg    Spec Grav, UA 1.015    Blood, UA neg  pH, UA 6.5    Protein, UA neg    Urobilinogen, UA 0.2    Nitrite, UA neg    Leukocytes, UA large (3+) (A) Negative

## 2014-08-27 NOTE — Patient Instructions (Signed)

## 2014-09-24 ENCOUNTER — Other Ambulatory Visit: Payer: Self-pay | Admitting: Family Medicine

## 2014-09-24 NOTE — Telephone Encounter (Signed)
Pt still has an rx in system

## 2014-09-26 ENCOUNTER — Ambulatory Visit (INDEPENDENT_AMBULATORY_CARE_PROVIDER_SITE_OTHER): Payer: Managed Care, Other (non HMO) | Admitting: Family

## 2014-09-26 ENCOUNTER — Encounter: Payer: Self-pay | Admitting: Family

## 2014-09-26 ENCOUNTER — Other Ambulatory Visit (HOSPITAL_COMMUNITY)
Admission: RE | Admit: 2014-09-26 | Discharge: 2014-09-26 | Disposition: A | Discharge status: Home / Self Care | Payer: Managed Care, Other (non HMO) | Source: Ambulatory Visit | Attending: Family | Admitting: Family

## 2014-09-26 VITALS — BP 122/84 | HR 54 | Temp 97.9°F | Resp 16 | Ht 64.0 in | Wt 174.0 lb

## 2014-09-26 DIAGNOSIS — Z113 Encounter for screening for infections with a predominantly sexual mode of transmission: Secondary | ICD-10-CM | POA: Insufficient documentation

## 2014-09-26 DIAGNOSIS — N760 Acute vaginitis: Secondary | ICD-10-CM

## 2014-09-26 DIAGNOSIS — Z0000 Encounter for general adult medical examination without abnormal findings: Secondary | ICD-10-CM | POA: Diagnosis not present

## 2014-09-26 NOTE — Progress Notes (Signed)
Subjective:    Patient ID: [name], female    DOB: 1965-05-12, 49 y.o.   MRN: [id number]  HPI  [name] is a 49 yr old female who is s/p hysterectomy who presents with report of vaginal irritation 3-4 weeks ago. Reports that she went to urgent care and was told that she had a bladder infection. She was given abx (unsure of name- completed).  She is not having vaginal irritation/discharge.  Denies dysuria.    Review of Systems See HPI  Past Medical History  Diagnosis Date  . Hypertension   . Headache(784.0)     Chronic migraines--Dr Lewitt  . Meckel's diverticulum 08/2004    with ischemia  . 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 Uterine adhesions   . Sacroiliac dysfunction 06/23/2012    Social History   Social History  . Marital Status: Single    Spouse Name: N/A  . Number of Children: 3  . Years of Education: N/A   Occupational History  . Cashier    Social History Main Topics  . Smoking status: Never Smoker   . Smokeless tobacco: Never Used  . Alcohol Use: Yes     Comment: social  . Drug Use: Not on file  . Sexual Activity: Not on file   Other Topics Concern  . Not on file   Social History Narrative   Lives with daughter    Daughter- lives in [place]- lives locally   Works at [employer]   Completed HS   No pets   Enjoys walking, mo   vies, Psychologist, clinical, bowling    Past Surgical History  Procedure Laterality Date  . Laparoscopy  11/2005    total laparoscopy  . Laparoscopic salpingoopherectomy  11/2005    right-- assisted with daVinci robot, and lysis of omental adhesions.  . Abdominal hysterectomy  11/2005  . Other surgical history      Meckel's diverticulectomy, small bowel resection and resection of retrocecal appendix.  . Bowel reconstruction      Family History  Problem Relation Age of Onset  . Cancer Neg Hx   . Hypertension Other     No Known Allergies  Current Outpatient Prescriptions on File Prior to Visit  Medication Sig Dispense  Refill  . cyclobenzaprine (FLEXERIL) 10 MG tablet Take 1 tablet (10 mg total) by mouth at bedtime as needed for muscle spasms. 30 tablet 1  . eletriptan (RELPAX) 40 MG tablet Take 1 tablet (40 mg total) by mouth as needed. may repeat in 2 hours if necessary 10 tablet 5  . flurbiprofen (ANSAID) 100 MG tablet Take 100 mg by mouth 2 (two) times daily as needed.    . hydrochlorothiazide (HYDRODIURIL) 25 MG tablet TAKE 1 TABLET (25 MG TOTAL) BY MOUTH DAILY. 90 tablet 1  . lisinopril (PRINIVIL,ZESTRIL) 20 MG tablet Take 1 tablet (20 mg total) by mouth daily. 90 tablet 1  . nebivolol (BYSTOLIC) 5 MG tablet Take 1 tablet (5 mg total) by mouth daily. 90 tablet 1  . phenazopyridine (PYRIDIUM) 200 MG tablet Take 1 tablet (200 mg total) by mouth 3 (three) times daily as needed. 6 tablet 0  . potassium chloride SA (K-DUR,KLOR-CON) 20 MEQ tablet Take 1 tablet (20 mEq total) by mouth daily. (Patient taking differently: Take 20 mEq by mouth 2 (two) times daily. ) 30 tablet 3  . sulfamethoxazole-trimethoprim (BACTRIM DS,SEPTRA DS) 800-160 MG per tablet Take 1 tablet by mouth 2 (two) times daily. 20 tablet  0   No current facility-administered medications on file prior to visit.    BP 122/84 mmHg  Pulse 54  Temp(Src) 97.9 F (36.6 C) (Oral)  Resp 16  Ht 5\' 4"  (1.626 m)  Wt 174 lb (78.926 kg)  BMI 29.85 kg/m2  SpO2 100%  LMP 11/10/2005       Objective:   Physical Exam  Constitutional: She is oriented to person, place, and time. She appears well-developed and well-nourished. No distress.  Genitourinary: Vagina normal. No vaginal discharge found.  Musculoskeletal: She exhibits no edema.  Neurological: She is alert and oriented to person, place, and time.  Skin: Skin is warm and dry.  Psychiatric: She has a normal mood and affect. Her behavior is normal. Judgment and thought content normal.          Assessment & Plan:  Vaginitis- pt requests testing for GC/Chlamydia as well as wet prep. Obtained  specimen. No need for pap, pt is s/p hysterectomy.

## 2014-09-26 NOTE — Progress Notes (Signed)
Pre visit review using our clinic review tool, if applicable. No additional management support is needed unless otherwise documented below in the visit note. 

## 2014-09-26 NOTE — Addendum Note (Signed)
Addended by: Kelle Darting A on: 09/26/2014 02:26 PM   Modules accepted: Orders

## 2014-09-26 NOTE — Patient Instructions (Signed)
We will contact you with your results.  

## 2014-09-30 LAB — CERVICOVAGINAL ANCILLARY ONLY
Chlamydia: NEGATIVE
Neisseria Gonorrhea: NEGATIVE

## 2014-10-01 ENCOUNTER — Telehealth: Payer: Self-pay | Admitting: Family

## 2014-10-01 LAB — CERVICOVAGINAL ANCILLARY ONLY: Wet Prep (BD Affirm): POSITIVE — AB

## 2014-10-01 MED ORDER — METRONIDAZOLE 500 MG PO TABS: 500.0000 mg | ORAL_TABLET | Freq: Two times a day (BID) | ORAL | Status: DC

## 2014-10-01 NOTE — Telephone Encounter (Signed)
Lab work + for trichomonas. Advise metronidazole bid x 1 week, no etoh while taking. Partner needs to be treated as well.

## 2014-10-01 NOTE — Telephone Encounter (Signed)
Notified pt and she voices understanding. 

## 2014-10-01 NOTE — Telephone Encounter (Signed)
Left message for pt to return my call.

## 2015-01-29 ENCOUNTER — Other Ambulatory Visit: Payer: Self-pay | Admitting: Family

## 2015-01-29 DIAGNOSIS — Z1231 Encounter for screening mammogram for malignant neoplasm of breast: Secondary | ICD-10-CM

## 2015-02-06 ENCOUNTER — Other Ambulatory Visit: Payer: Self-pay | Admitting: Physician Assistant

## 2015-02-06 NOTE — Telephone Encounter (Signed)
Refilled rx request #[id number] with 5rf

## 2015-02-11 ENCOUNTER — Ambulatory Visit
Admission: RE | Admit: 2015-02-11 | Discharge: 2015-02-11 | Disposition: A | Discharge status: Home / Self Care | Payer: Managed Care, Other (non HMO) | Source: Ambulatory Visit | Attending: Family | Admitting: Family

## 2015-02-11 DIAGNOSIS — Z1231 Encounter for screening mammogram for malignant neoplasm of breast: Secondary | ICD-10-CM

## 2015-02-12 ENCOUNTER — Telehealth: Payer: Self-pay | Admitting: Family

## 2015-02-12 NOTE — Telephone Encounter (Signed)
LM for pt to call and schedule flu shot or update records & schedule f/u due in March 2017

## 2015-02-16 ENCOUNTER — Telehealth: Payer: Self-pay | Admitting: Family

## 2015-02-16 NOTE — Telephone Encounter (Signed)
Health Maintenance  updated

## 2015-02-16 NOTE — Telephone Encounter (Signed)
Patient had Flu Shot in October 2016 at [employer]

## 2015-03-02 ENCOUNTER — Ambulatory Visit (INDEPENDENT_AMBULATORY_CARE_PROVIDER_SITE_OTHER): Payer: Managed Care, Other (non HMO) | Admitting: Podiatry

## 2015-03-02 ENCOUNTER — Ambulatory Visit (INDEPENDENT_AMBULATORY_CARE_PROVIDER_SITE_OTHER): Payer: Managed Care, Other (non HMO)

## 2015-03-02 VITALS — BP 166/102 | HR 67 | Resp 12 | Ht 63.0 in | Wt 175.0 lb

## 2015-03-02 DIAGNOSIS — M79672 Pain in left foot: Secondary | ICD-10-CM

## 2015-03-02 DIAGNOSIS — M722 Plantar fascial fibromatosis: Secondary | ICD-10-CM

## 2015-03-02 MED ORDER — TRIAMCINOLONE ACETONIDE 10 MG/ML IJ SUSP
10.0000 mg | Freq: Once | INTRAMUSCULAR | Status: AC
  Administered 2015-03-02: 10 mg

## 2015-03-02 MED ORDER — DICLOFENAC SODIUM 75 MG PO TBEC: 75.0000 mg | DELAYED_RELEASE_TABLET | Freq: Two times a day (BID) | ORAL | Status: DC

## 2015-03-02 NOTE — Progress Notes (Signed)
   Subjective:    Patient ID: [name], female    DOB: [birth year], 50 y.o.   MRN: [id number]  HPI  Heel pain in my left heel for about 3-4 weeks especially bad in the am and after sitting.     Review of Systems  All other systems reviewed and are negative.      Objective:   Physical Exam        Assessment & Plan:

## 2015-03-02 NOTE — Patient Instructions (Signed)

## 2015-03-04 NOTE — Progress Notes (Signed)
Subjective:     Patient ID: [name], female   DOB: [birth year], 50 y.o.   MRN: [id number]  HPI patient states I've had a lot of pain in my left heel of approximate 1 month duration. Do not remember specifically what I may have done   Review of Systems  All other systems reviewed and are negative.      Objective:   Physical Exam  Constitutional: She is oriented to person, place, and time.  Cardiovascular: Intact distal pulses.   Musculoskeletal: Normal range of motion.  Neurological: She is oriented to person, place, and time.  Skin: Skin is warm.  Nursing note and vitals reviewed.  neurovascular status found to be intact muscle strength adequate range of motion within normal limits with patient noted to have exquisite discomfort plantar aspect left heel at the insertional point of the tendon into the calcaneus with fluid buildup and patient is found to have moderate depression of the arch with good digital perfusion and well oriented 3     Assessment:     Acute plantar fasciitis left with inflammation fluid buildup    Plan:     H&P x-rays reviewed and today I injected the plantar fascia 3 mg Kenalog 5 mg Xylocaine and applied fascial brace with instructions on usage along with physical therapy. Reappoint to recheck

## 2015-03-09 ENCOUNTER — Ambulatory Visit: Payer: Managed Care, Other (non HMO) | Admitting: Podiatry

## 2015-03-12 LAB — LIPID PANEL
Cholesterol: 161 mg/dL (ref 0–200)
HDL: 24 mg/dL — AB (ref 35–70)

## 2015-03-12 LAB — BASIC METABOLIC PANEL: Glucose: 92 mg/dL

## 2015-03-25 ENCOUNTER — Encounter: Payer: Self-pay | Admitting: Family

## 2015-03-25 ENCOUNTER — Telehealth: Payer: Self-pay | Admitting: Family

## 2015-03-25 ENCOUNTER — Ambulatory Visit (INDEPENDENT_AMBULATORY_CARE_PROVIDER_SITE_OTHER): Payer: Managed Care, Other (non HMO) | Admitting: Family

## 2015-03-25 VITALS — BP 138/86 | HR 76 | Temp 98.3°F | Ht 63.0 in | Wt 174.4 lb

## 2015-03-25 DIAGNOSIS — I10 Essential (primary) hypertension: Secondary | ICD-10-CM | POA: Diagnosis not present

## 2015-03-25 DIAGNOSIS — R059 Cough, unspecified: Secondary | ICD-10-CM | POA: Insufficient documentation

## 2015-03-25 DIAGNOSIS — G43809 Other migraine, not intractable, without status migrainosus: Secondary | ICD-10-CM

## 2015-03-25 DIAGNOSIS — R05 Cough: Secondary | ICD-10-CM

## 2015-03-25 DIAGNOSIS — E876 Hypokalemia: Secondary | ICD-10-CM

## 2015-03-25 LAB — BASIC METABOLIC PANEL
BUN: 20 mg/dL (ref 6–23)
CO2: 31 mEq/L (ref 19–32)
Calcium: 9.5 mg/dL (ref 8.4–10.5)
Chloride: 104 mEq/L (ref 96–112)
Creatinine, Ser: 0.88 mg/dL (ref 0.40–1.20)
GFR: 87.35 mL/min (ref >60.00)
Glucose, Bld: 87 mg/dL (ref 70–99)
Potassium: 3.3 mEq/L — ABNORMAL LOW (ref 3.5–5.1)
Sodium: 140 mEq/L (ref 135–145)

## 2015-03-25 MED ORDER — HYDROCHLOROTHIAZIDE 25 MG PO TABS
25.0000 mg | ORAL_TABLET | Freq: Every day | ORAL | Status: DC
Start: 2015-03-25 — End: 2015-10-07

## 2015-03-25 MED ORDER — LISINOPRIL 20 MG PO TABS: 20.0000 mg | ORAL_TABLET | Freq: Every day | ORAL | Status: DC

## 2015-03-25 MED ORDER — NEBIVOLOL HCL 5 MG PO TABS
5.0000 mg | ORAL_TABLET | Freq: Every day | ORAL | Status: DC
Start: 2015-03-25 — End: 2015-10-07

## 2015-03-25 MED ORDER — RANITIDINE HCL 150 MG PO TABS: 150.0000 mg | ORAL_TABLET | Freq: Two times a day (BID) | ORAL | Status: DC

## 2015-03-25 MED ORDER — POTASSIUM CHLORIDE CRYS ER 20 MEQ PO TBCR: 20.0000 meq | EXTENDED_RELEASE_TABLET | Freq: Every day | ORAL | Status: DC

## 2015-03-25 NOTE — Assessment & Plan Note (Addendum)
Stable.  Monitor.  

## 2015-03-25 NOTE — Assessment & Plan Note (Signed)
Suspect gerd- trial of zantac.

## 2015-03-25 NOTE — Telephone Encounter (Signed)
Potassium is low. Add kdur once daily, repeat bmet in 1 week, dx hypokalemia.  

## 2015-03-25 NOTE — Assessment & Plan Note (Addendum)
BP stable on current meds. Continue same, obtain bmet.  

## 2015-03-25 NOTE — Progress Notes (Signed)
Subjective:    Patient ID: [name], female    DOB: 01-Apr-1965, 50 y.o.   MRN: [id number]  HPI  [name] is a 50 yr old female who presents today for follow up of her blood pressure.  She is currently maintained on lisinopril 20mg  and hctz 25mg , and bystolic 5mg .    BP Readings from Last 3 Encounters:  03/25/15 138/86  03/02/15 166/102  09/26/14 122/84   Migraines-  Denies recent headaches.   Cough- reports + cough at night "when I lay down."  Notes some recent reflux when brushing her teeth.  She denies nasal drainage.   Review of Systems  Respiratory: Negative for cough and shortness of breath.   Cardiovascular: Negative for chest pain.  Neurological: Negative for headaches.   See HPI  Past Medical History  Diagnosis Date  . Hypertension   . Headache(784.0)     Chronic migraines--Dr Lewitt  . Meckel's diverticulum 08/2004    with ischemia  . 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 Uterine adhesions   . Sacroiliac dysfunction 06/23/2012    Social History   Social History  . Marital Status: Single    Spouse Name: N/A  . Number of Children: 3  . Years of Education: N/A   Occupational History  . Cashier    Social History Main Topics  . Smoking status: Never Smoker   . Smokeless tobacco: Never Used  . Alcohol Use: Yes     Comment: social  . Drug Use: Not on file  . Sexual Activity: Not on file   Other Topics Concern  . Not on file   Social History Narrative   Lives with daughter    Daughter- lives in [place]- lives locally   Works at [employer]   Completed HS   No pets   Enjoys walking, mo   vies, Psychologist, clinical, bowling    Past Surgical History  Procedure Laterality Date  . Laparoscopy  11/2005    total laparoscopy  . Laparoscopic salpingoopherectomy  11/2005    right-- assisted with daVinci robot, and lysis of omental adhesions.  . Abdominal hysterectomy  11/2005  . Other surgical history      Meckel's diverticulectomy, small bowel resection and  resection of retrocecal appendix.  . Bowel reconstruction      Family History  Problem Relation Age of Onset  . Cancer Neg Hx   . Hypertension Other     No Known Allergies  Current Outpatient Prescriptions on File Prior to Visit  Medication Sig Dispense Refill  . cyclobenzaprine (FLEXERIL) 10 MG tablet Take 1 tablet (10 mg total) by mouth at bedtime as needed for muscle spasms. 30 tablet 1  . diclofenac (VOLTAREN) 75 MG EC tablet Take 1 tablet (75 mg total) by mouth 2 (two) times daily. 50 tablet 2  . eletriptan (RELPAX) 40 MG tablet Take 1 tablet (40 mg total) by mouth as needed. may repeat in 2 hours if necessary 10 tablet 5  . flurbiprofen (ANSAID) 100 MG tablet Take 100 mg by mouth 2 (two) times daily as needed. Reported on 03/02/2015    . hydrochlorothiazide (HYDRODIURIL) 25 MG tablet TAKE 1 TABLET BY MOUTH ONCE A DAY 30 tablet 5  . lisinopril (PRINIVIL,ZESTRIL) 20 MG tablet Take 1 tablet (20 mg total) by mouth daily. 90 tablet 1  . nebivolol (BYSTOLIC) 5 MG tablet Take 1 tablet (5 mg total) by mouth daily. 90 tablet 1   No current facility-administered  medications on file prior to visit.    BP 138/86 mmHg  Pulse 76  Temp(Src) 98.3 F (36.8 C) (Oral)  Ht 5\' 3"  (1.6 m)  Wt 174 lb 6.4 oz (79.107 kg)  BMI 30.90 kg/m2  SpO2 98%  LMP 11/10/2005       Objective:   Physical Exam  Constitutional: She is oriented to person, place, and time. She appears well-developed and well-nourished.  HENT:  Head: Normocephalic and atraumatic.  Cardiovascular: Normal rate, regular rhythm and normal heart sounds.   No murmur heard. Pulmonary/Chest: Effort normal and breath sounds normal. No respiratory distress. She has no wheezes.  Musculoskeletal: She exhibits no edema.  Neurological: She is alert and oriented to person, place, and time.  Psychiatric: She has a normal mood and affect. Her behavior is normal. Judgment and thought content normal.          Assessment & Plan:

## 2015-03-25 NOTE — Patient Instructions (Addendum)
Please complete lab work prior to leaving.  Continue current medications for blood pressure. Add zantac twice daily- this may help with your cough.  Follow up in 3 months.

## 2015-03-25 NOTE — Progress Notes (Signed)
Pre visit review using our clinic review tool, if applicable. No additional management support is needed unless otherwise documented below in the visit note. 

## 2015-03-26 NOTE — Telephone Encounter (Signed)
Called pt and LMOM to start taking Kdur once daily and call office to schedule lab appt in 1 week. Future lab orders placed.

## 2015-04-06 ENCOUNTER — Ambulatory Visit (INDEPENDENT_AMBULATORY_CARE_PROVIDER_SITE_OTHER): Payer: Managed Care, Other (non HMO) | Admitting: Family Medicine

## 2015-04-06 VITALS — BP 110/72 | HR 68 | Temp 97.8°F | Resp 16 | Ht 64.0 in | Wt 172.0 lb

## 2015-04-06 DIAGNOSIS — J811 Chronic pulmonary edema: Secondary | ICD-10-CM

## 2015-04-06 DIAGNOSIS — R059 Cough, unspecified: Secondary | ICD-10-CM

## 2015-04-06 DIAGNOSIS — R05 Cough: Secondary | ICD-10-CM

## 2015-04-06 MED ORDER — HYDROCODONE-HOMATROPINE 5-1.5 MG/5ML PO SYRP: 5.0000 mL | ORAL_SOLUTION | Freq: Two times a day (BID) | ORAL | Status: DC | PRN

## 2015-04-06 NOTE — Progress Notes (Signed)
[facility] at [facility] [address] [place], [place] [address] [phone] [id number] [phone]  Date:  04/06/2015   Name:  [name]   DOB:  [birth year]   MRN:  [id number]  PCP:  Nance Pear., NP    Chief Complaint: Nausea; Cough; Sore Throat; and Generalized Body Aches   History of Present Illness:  [name] is a 50 y.o. very pleasant female patient who presents with the following: 4 days of illness: - Sore throat & nausea have resolved.  - Chills, no fever -  For cough Rx'd tessalon perles by [place] at work 3 days ago.  Cough is manageable with perles.  -- Non-productive.  Congestion as well.  - No sick contacts at home, but many sick co-workers.  - flu shot this past fall.    Patient Active Problem List   Diagnosis Date Noted  . Cough 03/25/2015  . Preventative health care 07/25/2014  . Left knee pain 04/09/2014  . Right shoulder pain 07/15/2013  . Right wrist pain 07/15/2013  . Musculoskeletal arm pain 07/05/2013  . Wrist pain 07/05/2013  . Sacroiliac dysfunction 06/23/2012  . Nevus 04/18/2012  . 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 SKIN LESION 12/11/2008  . BACK PAIN 12/11/2008  . Migraine 02/15/2008  . GOITER 02/14/2008  . Essential hypertension 02/14/2008    Past Medical History  Diagnosis Date  . Hypertension   . Headache(784.0)     Chronic migraines--Dr Lewitt  . Meckel's diverticulum 08/2004    with ischemia  . 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 Uterine adhesions   . Sacroiliac dysfunction 06/23/2012    Past Surgical History  Procedure Laterality Date  . Laparoscopy  11/2005    total laparoscopy  . Laparoscopic salpingoopherectomy  11/2005    right-- assisted with daVinci robot, and lysis of omental adhesions.  . Abdominal hysterectomy  11/2005  . Other surgical history      Meckel's diverticulectomy, small bowel resection and resection of retrocecal appendix.  . Bowel reconstruction      Social  History  Substance Use Topics  . Smoking status: Never Smoker   . Smokeless tobacco: Never Used  . Alcohol Use: Yes     Comment: social    Family History  Problem Relation Age of Onset  . Cancer Neg Hx   . Hypertension Other     No Known Allergies  Medication list has been reviewed and updated.  Current Outpatient Prescriptions on File Prior to Visit  Medication Sig Dispense Refill  . eletriptan (RELPAX) 40 MG tablet Take 1 tablet (40 mg total) by mouth as needed. may repeat in 2 hours if necessary 10 tablet 5  . flurbiprofen (ANSAID) 100 MG tablet Take 100 mg by mouth 2 (two) times daily as needed. Reported on 04/06/2015    . hydrochlorothiazide (HYDRODIURIL) 25 MG tablet Take 1 tablet (25 mg total) by mouth daily. 30 tablet 5  . lisinopril (PRINIVIL,ZESTRIL) 20 MG tablet Take 1 tablet (20 mg total) by mouth daily. 90 tablet 1  . nebivolol (BYSTOLIC) 5 MG tablet Take 1 tablet (5 mg total) by mouth daily. 90 tablet 1  . potassium chloride SA (K-DUR,KLOR-CON) 20 MEQ tablet Take 1 tablet (20 mEq total) by mouth daily. 30 tablet 3  . diclofenac (VOLTAREN) 75 MG EC tablet Take 1 tablet (75 mg total) by mouth 2 (two) times daily. (Patient not taking: Reported on 04/06/2015) 50 tablet 2  .  ranitidine (ZANTAC) 150 MG tablet Take 1 tablet (150 mg total) by mouth 2 (two) times daily. (Patient not taking: Reported on 04/06/2015) 60 tablet 3   No current facility-administered medications on file prior to visit.    Review of Systems: Review of Systems  Constitutional: Positive for chills, malaise/fatigue and diaphoresis. Negative for fever.  HENT: Positive for congestion. Negative for ear pain and sore throat (mild, resolved. ).   Eyes: Negative for blurred vision.  Respiratory: Positive for cough. Negative for sputum production, shortness of breath and wheezing.   Cardiovascular: Negative for chest pain and palpitations.  Gastrointestinal: Positive for nausea (resolved. ). Negative for  vomiting, diarrhea and constipation.  Genitourinary: Negative for dysuria, urgency and frequency.  Musculoskeletal: Negative for myalgias and joint pain.  Skin: Negative for rash.  Neurological: Positive for headaches (mild). Negative for dizziness.    Physical Examination: Filed Vitals:   04/06/15 1039  BP: 110/72  Pulse: 68  Temp: 97.8 F (36.6 C)  Resp: 16   Filed Vitals:   04/06/15 1039  Height: 5\' 4"  (1.626 m)  Weight: 172 lb (78.019 kg)   Body mass index is 29.51 kg/(m^2). Ideal Body Weight: Weight in (lb) to have BMI = 25: 145.3  GEN: WDWN, NAD, Non-toxic, A & O x 3 HEENT: Atraumatic, Normocephalic. TM wnl. Significant congestion, swelling of turbinates.  Neck supple. No masses, No LAD. CV: RRR, No M/G/R. No JVD. No thrill. No extra heart sounds. PULM: CTA B, no wheezes, crackles, rhonchi. No retractions. No resp. distress. No accessory muscle use. ABD: S, NT, ND, +BS. No rebound. No HSM. EXTR: No c/c/e NEURO Normal gait.  PSYCH: Normally interactive. Conversant. Not depressed or anxious appearing.  Calm demeanor.    Assessment and Plan: URI with cough: normal vitals and exam.  Will continue with symptomatic care.   - Afrin x 3 days. Then switch to nasal saline.  - Continue with tessalon perles during the day.  Hycodan at night.  - f/u PRN.   Signed Gerre Pebbles, MD

## 2015-04-06 NOTE — Patient Instructions (Addendum)
IF you received an x-ray today, you will receive an invoice from [facility]. Please contact [facility] at [phone] with questions or concerns regarding your invoice.   IF you received labwork today, you will receive an invoice from [employer]. Please contact [facility] at [phone] with questions or concerns regarding your invoice.   Our billing staff will not be able to assist you with questions regarding bills from these companies.  You will be contacted with the lab results as soon as they are available. The fastest way to get your results is to activate your My Chart account. Instructions are located on the last page of this paperwork. If you have not heard from [place] regarding the results in 2 weeks, please contact this office.     Oxymetazoline nasal spray What is this medicine? Oxymetazoline (OX ee me TAZ oh leen) is a nasal decongestant. This medicine is used to treat nasal congestion or a stuffy nose. This medicine will not treat an infection. This medicine may be used for other purposes; ask your health care provider or pharmacist if you have questions. What should I tell my health care provider before I take this medicine? They need to know if you have any of these conditions: -diabetes -heart disease -high blood pressure -thyroid disease -trouble urinating due to an enlarged prostate gland -an unusual or allergic reaction to oxymetazoline, other medicines, foods, dyes, or preservatives -pregnant or trying to get pregnant -breast-feeding How should I use this medicine? This medicine is for use in the nose. Do not take by mouth. Follow the directions on the package label. Shake well before using. Use your medicine at regular intervals or as directed by your health care provider. Do not use it more often than directed. Do not use for more than 3 days in a row without advice. Make sure that you are using your nasal spray  correctly. Ask your doctor or health care provider if you have any questions. Talk to your pediatrician regarding the use of this medicine in children. While this drug may be prescribed for children for selected conditions, precautions do apply. Overdosage: If you think you have taken too much of this medicine contact a poison control center or emergency room at once. NOTE: This medicine is only for you. Do not share this medicine with others. What if I miss a dose? If you miss a dose, use it as soon as you can. If it is almost time for your next dose, use only that dose. Do not use double or extra doses. What may interact with this medicine? Do not take this medicine with any of the following medications: -MAOIs like Marplan, Nardil, and Parnate This list may not describe all possible interactions. Give your health care provider a list of all the medicines, herbs, non-prescription drugs, or dietary supplements you use. Also tell them if you smoke, drink alcohol, or use illegal drugs. Some items may interact with your medicine. What should I watch for while using this medicine? Tell your doctor or healthcare professional if your symptoms do not start to get better or if they get worse. Do not share this bottle with anyone else as this may spread germs. What side effects may I notice from receiving this medicine? Side effects that you should report to your doctor or health care professional as soon as possible: -allergic reactions like skin rash, itching or hives, swelling of the face, lips, or tongue Side effects that usually do  not require medical attention (Report these to your doctor or health care professional if they continue or are bothersome.): -burning, stinging, or irritation in the nose right after use -increased nasal discharge -sneezing This list may not describe all possible side effects. Call your doctor for medical advice about side effects. You may report side effects to FDA at  1-800-FDA-1088. Where should I keep my medicine? Keep out of the reach of children. Store at room temperature between 20 and 25 degrees C (68 and 77 degrees F). Throw away any unused medicine after the expiration date. NOTE: This sheet is a summary. It may not cover all possible information. If you have questions about this medicine, talk to your doctor, pharmacist, or health care provider.    2016, Elsevier/Gold Standard. (2010-07-28 14:11:31) Upper Respiratory Infection, Adult Most upper respiratory infections (URIs) are a viral infection of the air passages leading to the lungs. A URI affects the nose, throat, and upper air passages. The most common type of URI is nasopharyngitis and is typically referred to as "the common cold." URIs run their course and usually go away on their own. Most of the time, a URI does not require medical attention, but sometimes a bacterial infection in the upper airways can follow a viral infection. This is called a secondary infection. Sinus and middle ear infections are common types of secondary upper respiratory infections. Bacterial pneumonia can also complicate a URI. A URI can worsen asthma and chronic obstructive pulmonary disease (COPD). Sometimes, these complications can require emergency medical care and may be life threatening.  CAUSES Almost all URIs are caused by viruses. A virus is a type of germ and can spread from one person to another.  RISKS FACTORS You may be at risk for a URI if:   You smoke.   You have chronic heart or lung disease.  You have a weakened defense (immune) system.   You are very young or very old.   You have nasal allergies or asthma.  You work in crowded or poorly ventilated areas.  You work in health care facilities or schools. SIGNS AND SYMPTOMS  Symptoms typically develop 2-3 days after you come in contact with a cold virus. Most viral URIs last 7-10 days. However, viral URIs from the influenza virus (flu virus)  can last 14-18 days and are typically more severe. Symptoms may include:   Runny or stuffy (congested) nose.   Sneezing.   Cough.   Sore throat.   Headache.   Fatigue.   Fever.   Loss of appetite.   Pain in your forehead, behind your eyes, and over your cheekbones (sinus pain).  Muscle aches.  DIAGNOSIS  Your health care provider may diagnose a URI by:  Physical exam.  Tests to check that your symptoms are not due to another condition such as:  Strep throat.  Sinusitis.  Pneumonia.  Asthma. TREATMENT  A URI goes away on its own with time. It cannot be cured with medicines, but medicines may be prescribed or recommended to relieve symptoms. Medicines may help:  Reduce your fever.  Reduce your cough.  Relieve nasal congestion. HOME CARE INSTRUCTIONS   Take medicines only as directed by your health care provider.   Gargle warm saltwater or take cough drops to comfort your throat as directed by your health care provider.  Use a warm mist humidifier or inhale steam from a shower to increase air moisture. This may make it easier to breathe.  Drink enough fluid to  keep your urine clear or pale yellow.   Eat soups and other clear broths and maintain good nutrition.   Rest as needed.   Return to work when your temperature has returned to normal or as your health care provider advises. You may need to stay home longer to avoid infecting others. You can also use a face mask and careful hand washing to prevent spread of the virus.  Increase the usage of your inhaler if you have asthma.   Do not use any tobacco products, including cigarettes, chewing tobacco, or electronic cigarettes. If you need help quitting, ask your health care provider. PREVENTION  The best way to protect yourself from getting a cold is to practice good hygiene.   Avoid oral or hand contact with people with cold symptoms.   Wash your hands often if contact occurs.  There is  no clear evidence that vitamin C, vitamin E, echinacea, or exercise reduces the chance of developing a cold. However, it is always recommended to get plenty of rest, exercise, and practice good nutrition.  SEEK MEDICAL CARE IF:   You are getting worse rather than better.   Your symptoms are not controlled by medicine.   You have chills.  You have worsening shortness of breath.  You have brown or red mucus.  You have yellow or brown nasal discharge.  You have pain in your face, especially when you bend forward.  You have a fever.  You have swollen neck glands.  You have pain while swallowing.  You have white areas in the back of your throat. SEEK IMMEDIATE MEDICAL CARE IF:   You have severe or persistent:  Headache.  Ear pain.  Sinus pain.  Chest pain.  You have chronic lung disease and any of the following:  Wheezing.  Prolonged cough.  Coughing up blood.  A change in your usual mucus.  You have a stiff neck.  You have changes in your:  Vision.  Hearing.  Thinking.  Mood. MAKE SURE YOU:   Understand these instructions.  Will watch your condition.  Will get help right away if you are not doing well or get worse.   This information is not intended to replace advice given to you by your health care provider. Make sure you discuss any questions you have with your health care provider.   Document Released: 06/22/2000 Document Revised: 05/13/2014 Document Reviewed: 04/03/2013 Elsevier Interactive Patient Education 2016 Elsevier Inc. Cough, Adult Coughing is a reflex that clears your throat and your airways. Coughing helps to heal and protect your lungs. It is normal to cough occasionally, but a cough that happens with other symptoms or lasts a long time may be a sign of a condition that needs treatment. A cough may last only 2-3 weeks (acute), or it may last longer than 8 weeks (chronic). CAUSES Coughing is commonly caused by:  Breathing in  substances that irritate your lungs.  A viral or bacterial respiratory infection.  Allergies.  Asthma.  Postnasal drip.  Smoking.  Acid backing up from the stomach into the esophagus (gastroesophageal reflux).  Certain medicines.  Chronic lung problems, including COPD (or rarely, lung cancer).  Other medical conditions such as heart failure. HOME CARE INSTRUCTIONS  Pay attention to any changes in your symptoms. Take these actions to help with your discomfort:  Take medicines only as told by your health care provider.  If you were prescribed an antibiotic medicine, take it as told by your health care provider. Do not  stop taking the antibiotic even if you start to feel better.  Talk with your health care provider before you take a cough suppressant medicine.  Drink enough fluid to keep your urine clear or pale yellow.  If the air is dry, use a cold steam vaporizer or humidifier in your bedroom or your home to help loosen secretions.  Avoid anything that causes you to cough at work or at home.  If your cough is worse at night, try sleeping in a semi-upright position.  Avoid cigarette smoke. If you smoke, quit smoking. If you need help quitting, ask your health care provider.  Avoid caffeine.  Avoid alcohol.  Rest as needed. SEEK MEDICAL CARE IF:   You have new symptoms.  You cough up pus.  Your cough does not get better after 2-3 weeks, or your cough gets worse.  You cannot control your cough with suppressant medicines and you are losing sleep.  You develop pain that is getting worse or pain that is not controlled with pain medicines.  You have a fever.  You have unexplained weight loss.  You have night sweats. SEEK IMMEDIATE MEDICAL CARE IF:  You cough up blood.  You have difficulty breathing.  Your heartbeat is very fast.   This information is not intended to replace advice given to you by your health care provider. Make sure you discuss any  questions you have with your health care provider.   Document Released: 06/25/2010 Document Revised: 09/17/2014 Document Reviewed: 03/05/2014 Elsevier Interactive Patient Education [employer].

## 2015-06-09 ENCOUNTER — Encounter: Payer: Self-pay | Admitting: Family

## 2015-06-24 ENCOUNTER — Ambulatory Visit: Payer: Managed Care, Other (non HMO) | Admitting: Family

## 2015-07-01 ENCOUNTER — Ambulatory Visit (INDEPENDENT_AMBULATORY_CARE_PROVIDER_SITE_OTHER): Payer: Managed Care, Other (non HMO) | Admitting: Family

## 2015-07-01 ENCOUNTER — Encounter: Payer: Self-pay | Admitting: Family

## 2015-07-01 ENCOUNTER — Encounter: Payer: Self-pay | Admitting: Gastroenterology

## 2015-07-01 VITALS — BP 120/86 | HR 60 | Temp 98.7°F | Resp 16 | Ht 63.0 in | Wt 172.4 lb

## 2015-07-01 DIAGNOSIS — G43809 Other migraine, not intractable, without status migrainosus: Secondary | ICD-10-CM

## 2015-07-01 DIAGNOSIS — K219 Gastro-esophageal reflux disease without esophagitis: Secondary | ICD-10-CM | POA: Diagnosis not present

## 2015-07-01 DIAGNOSIS — Z0000 Encounter for general adult medical examination without abnormal findings: Secondary | ICD-10-CM

## 2015-07-01 DIAGNOSIS — I10 Essential (primary) hypertension: Secondary | ICD-10-CM

## 2015-07-01 LAB — BASIC METABOLIC PANEL
BUN: 15 mg/dL (ref 6–23)
CO2: 30 mEq/L (ref 19–32)
Calcium: 9.6 mg/dL (ref 8.4–10.5)
Chloride: 104 mEq/L (ref 96–112)
Creatinine, Ser: 0.7 mg/dL (ref 0.40–1.20)
GFR: 113.62 mL/min (ref >60.00)
Glucose, Bld: 81 mg/dL (ref 70–99)
Potassium: 4.1 mEq/L (ref 3.5–5.1)
Sodium: 137 mEq/L (ref 135–145)

## 2015-07-01 MED ORDER — POTASSIUM CHLORIDE CRYS ER 20 MEQ PO TBCR: 20.0000 meq | EXTENDED_RELEASE_TABLET | Freq: Every day | ORAL | Status: DC

## 2015-07-01 MED ORDER — RANITIDINE HCL 150 MG PO TABS: 150.0000 mg | ORAL_TABLET | Freq: Two times a day (BID) | ORAL | Status: DC

## 2015-07-01 NOTE — Patient Instructions (Signed)
Please complete lab work prior to leaving.   

## 2015-07-01 NOTE — Progress Notes (Signed)
Pre visit review using our clinic review tool, if applicable. No additional management support is needed unless otherwise documented below in the visit note. 

## 2015-07-01 NOTE — Assessment & Plan Note (Signed)
Stable with rare HA's.  Monitor.

## 2015-07-01 NOTE — Assessment & Plan Note (Signed)
BP stable, continue current meds. 

## 2015-07-01 NOTE — Assessment & Plan Note (Signed)
Stable on h2b, continue same.

## 2015-07-01 NOTE — Progress Notes (Signed)
Subjective:    Patient ID: [name], female    DOB: 04/05/1965, 50 y.o.   MRN: [id number]  HPI  [name] is a 50 yr old female who presents today for follow up of her hypertension.  1) HTN- Current medications include: HCTZ, lisinopril and bystolic.  BP Readings from Last 3 Encounters:  07/01/15 120/86  04/06/15 110/72  03/25/15 138/86   2) GERD- maintained on zantac.reports gerd symptoms are well controlled.   3) Migraines- has not had a migraine in >2 months and the last migraine was not bad.    Seeing podiatry and had "shot" for plantar fasciitis.    Review of Systems    see HPI  Past Medical History  Diagnosis Date  . Hypertension   . Headache(784.0)     Chronic migraines--Dr Lewitt  . Meckel's diverticulum 08/2004    with ischemia  . 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 Uterine adhesions   . Sacroiliac dysfunction 06/23/2012     Social History   Social History  . Marital Status: Single    Spouse Name: N/A  . Number of Children: 3  . Years of Education: N/A   Occupational History  . Cashier    Social History Main Topics  . Smoking status: Never Smoker   . Smokeless tobacco: Never Used  . Alcohol Use: Yes     Comment: social  . Drug Use: Not on file  . Sexual Activity: Not on file   Other Topics Concern  . Not on file   Social History Narrative   Lives with daughter    Daughter- lives in [place]- lives locally   Works at [employer]   Completed HS   No pets   Enjoys walking, mo   vies, Psychologist, clinical, bowling    Past Surgical History  Procedure Laterality Date  . Laparoscopy  11/2005    total laparoscopy  . Laparoscopic salpingoopherectomy  11/2005    right-- assisted with daVinci robot, and lysis of omental adhesions.  . Abdominal hysterectomy  11/2005  . Other surgical history      Meckel's diverticulectomy, small bowel resection and resection of retrocecal appendix.  . Bowel reconstruction      Family History  Problem Relation Age of  Onset  . Cancer Neg Hx   . Hypertension Other     No Known Allergies  Current Outpatient Prescriptions on File Prior to Visit  Medication Sig Dispense Refill  . eletriptan (RELPAX) 40 MG tablet Take 1 tablet (40 mg total) by mouth as needed. may repeat in 2 hours if necessary 10 tablet 5  . hydrochlorothiazide (HYDRODIURIL) 25 MG tablet Take 1 tablet (25 mg total) by mouth daily. 30 tablet 5  . lisinopril (PRINIVIL,ZESTRIL) 20 MG tablet Take 1 tablet (20 mg total) by mouth daily. 90 tablet 1  . nebivolol (BYSTOLIC) 5 MG tablet Take 1 tablet (5 mg total) by mouth daily. 90 tablet 1  . potassium chloride SA (K-DUR,KLOR-CON) 20 MEQ tablet Take 1 tablet (20 mEq total) by mouth daily. 30 tablet 3  . ranitidine (ZANTAC) 150 MG tablet Take 1 tablet (150 mg total) by mouth 2 (two) times daily. 60 tablet 3   No current facility-administered medications on file prior to visit.    BP 120/86 mmHg  Pulse 60  Temp(Src) 98.7 F (37.1 C) (Oral)  Resp 16  Ht 5\' 3"  (1.6 m)  Wt 172 lb 6.4 oz (78.2 kg)  BMI 30.55 kg/m2  SpO2 99%  LMP 11/10/2005    Objective:   Physical Exam  Constitutional: She appears well-developed and well-nourished.  Cardiovascular: Normal rate, regular rhythm and normal heart sounds.   No murmur heard. Pulmonary/Chest: Effort normal and breath sounds normal. No respiratory distress. She has no wheezes.  Psychiatric: She has a normal mood and affect. Her behavior is normal. Judgment and thought content normal.          Assessment & Plan:

## 2015-08-05 ENCOUNTER — Ambulatory Visit (AMBULATORY_SURGERY_CENTER): Payer: Self-pay | Admitting: *Deleted

## 2015-08-05 ENCOUNTER — Encounter: Payer: Self-pay | Admitting: Gastroenterology

## 2015-08-05 VITALS — Ht 63.0 in | Wt 174.0 lb

## 2015-08-05 DIAGNOSIS — Z1211 Encounter for screening for malignant neoplasm of colon: Secondary | ICD-10-CM

## 2015-08-05 MED ORDER — NA SULFATE-K SULFATE-MG SULF 17.5-3.13-1.6 GM/177ML PO SOLN: 1.0000 | Freq: Once | ORAL | 0 refills | Status: AC

## 2015-08-05 NOTE — Progress Notes (Signed)
No egg or soy allergy known to patient  No issues with past sedation with any surgeries  or procedures, no intubation problems  No diet pills per patient No home 02 use per patient  No blood thinners per patient  Pt denies issues with constipation   

## 2015-08-19 ENCOUNTER — Ambulatory Visit (AMBULATORY_SURGERY_CENTER): Payer: Managed Care, Other (non HMO) | Admitting: Gastroenterology

## 2015-08-19 ENCOUNTER — Encounter: Payer: Self-pay | Admitting: Gastroenterology

## 2015-08-19 VITALS — BP 102/83 | HR 74 | Temp 96.0°F | Resp 15 | Ht 63.0 in | Wt 174.0 lb

## 2015-08-19 DIAGNOSIS — Z1211 Encounter for screening for malignant neoplasm of colon: Secondary | ICD-10-CM | POA: Diagnosis not present

## 2015-08-19 DIAGNOSIS — K635 Polyp of colon: Secondary | ICD-10-CM | POA: Diagnosis not present

## 2015-08-19 DIAGNOSIS — D125 Benign neoplasm of sigmoid colon: Secondary | ICD-10-CM

## 2015-08-19 MED ORDER — SODIUM CHLORIDE 0.9 % IV SOLN: 500.0000 mL | INTRAVENOUS | Status: DC

## 2015-08-19 NOTE — Op Note (Signed)
[place] Patient Name: [name] Procedure Date: 08/19/2015 10:55 AM MRN: [id number] Endoscopist: Williamson. Loletha Carrow , MD Age: 50 Referring MD:  Date of Birth: [birth year] Gender: Female Account #: [id number] Procedure:                Colonoscopy Indications:              Screening for colorectal malignant neoplasm, This                            is the patient's first colonoscopy Medicines:                Monitored Anesthesia Care Procedure:                Pre-Anesthesia Assessment:                           - Prior to the procedure, a History and Physical                            was performed, and patient medications and                            allergies were reviewed. The patient's tolerance of                            previous anesthesia was also reviewed. The risks                            and benefits of the procedure and the sedation                            options and risks were discussed with the patient.                            All questions were answered, and informed consent                            was obtained. Prior Anticoagulants: The patient has                            taken no previous anticoagulant or antiplatelet                            agents. ASA Grade Assessment: II - A patient with                            mild systemic disease. After reviewing the risks                            and benefits, the patient was deemed in                            satisfactory condition to undergo the procedure.  After obtaining informed consent, the colonoscope                            was passed under direct vision. Throughout the                            procedure, the patient's blood pressure, pulse, and                            oxygen saturations were monitored continuously. The                            Model CF-HQ190L [id number]) scope was introduced                            through the anus and  advanced to the the cecum,                            identified by appendiceal orifice and ileocecal                            valve. The colonoscopy was performed without                            difficulty. The patient tolerated the procedure                            well. The quality of the bowel preparation was                            excellent. The ileocecal valve, appendiceal                            orifice, and rectum were photographed. The bowel                            preparation used was SUPREP. The quality of the                            bowel preparation was evaluated using the BBPS                            [facility] Bowel Preparation Scale) with scores of:                            Right Colon = 3, Transverse Colon = 3 and Left                            Colon = 3 (entire mucosa seen well with no residual                            staining, small fragments of stool or opaque  liquid). The total BBPS score equals 9. Scope In: 10:59:34 AM Scope Out: 11:12:58 AM Scope Withdrawal Time: 0 hours 11 minutes 22 seconds  Total Procedure Duration: 0 hours 13 minutes 24 seconds  Findings:                 The perianal and digital rectal examinations were                            normal.                           Two sessile polyps were found in the distal sigmoid                            colon. The polyps were 2 mm in size. These polyps                            were removed with a cold biopsy forceps. Resection                            and retrieval were complete.                           The exam was otherwise without abnormality on                            direct and retroflexion views. Complications:            No immediate complications. Estimated Blood Loss:     Estimated blood loss: none. Impression:               - Two 2 mm polyps in the distal sigmoid colon,                            removed with a cold biopsy forceps.  Resected and                            retrieved.                           - The examination was otherwise normal on direct                            and retroflexion views. Recommendation:           - Patient has a contact number available for                            emergencies. The signs and symptoms of potential                            delayed complications were discussed with the                            patient. Return to normal activities tomorrow.  Written discharge instructions were provided to the                            patient.                           - Resume previous diet.                           - Continue present medications.                           - Await pathology results.                           - Repeat colonoscopy is recommended for                            surveillance. The colonoscopy date will be                            determined after pathology results from today's                            exam become available for review. Mannat Benedetti L. Loletha Carrow, MD 08/19/2015 11:16:28 AM This report has been signed electronically.

## 2015-08-19 NOTE — Patient Instructions (Signed)

## 2015-08-19 NOTE — Progress Notes (Signed)
Called to room to assist during endoscopic procedure.  Patient ID and intended procedure confirmed with present staff. Received instructions for my participation in the procedure from the performing physician.  

## 2015-08-19 NOTE — Progress Notes (Signed)
Patient awakening,vss,report to rn 

## 2015-08-20 ENCOUNTER — Telehealth: Payer: Self-pay

## 2015-08-20 NOTE — Telephone Encounter (Signed)
  Follow up Call-  Call back number 08/19/2015  Post procedure Call Back phone  # [phone] cell  Permission to leave phone message Yes  Some recent data might be hidden    Patient was called for follow up after her procedure on 08/19/2015. No answer at the number given for follow up phone call. A message was left on the answering machine.

## 2015-08-27 ENCOUNTER — Encounter: Payer: Self-pay | Admitting: Gastroenterology

## 2015-09-30 ENCOUNTER — Ambulatory Visit: Payer: Managed Care, Other (non HMO) | Admitting: Family

## 2015-10-07 ENCOUNTER — Encounter: Payer: Self-pay | Admitting: Family

## 2015-10-07 ENCOUNTER — Ambulatory Visit (INDEPENDENT_AMBULATORY_CARE_PROVIDER_SITE_OTHER): Payer: Managed Care, Other (non HMO) | Admitting: Family

## 2015-10-07 VITALS — BP 171/90 | HR 58 | Temp 99.1°F | Resp 18 | Ht 63.0 in | Wt 175.6 lb

## 2015-10-07 DIAGNOSIS — G43809 Other migraine, not intractable, without status migrainosus: Secondary | ICD-10-CM | POA: Diagnosis not present

## 2015-10-07 DIAGNOSIS — K219 Gastro-esophageal reflux disease without esophagitis: Secondary | ICD-10-CM | POA: Diagnosis not present

## 2015-10-07 DIAGNOSIS — Z0000 Encounter for general adult medical examination without abnormal findings: Secondary | ICD-10-CM | POA: Diagnosis not present

## 2015-10-07 DIAGNOSIS — I10 Essential (primary) hypertension: Secondary | ICD-10-CM

## 2015-10-07 MED ORDER — LISINOPRIL 20 MG PO TABS: 20.0000 mg | ORAL_TABLET | Freq: Every day | ORAL | 1 refills | Status: DC

## 2015-10-07 MED ORDER — NEBIVOLOL HCL 5 MG PO TABS: 5.0000 mg | ORAL_TABLET | Freq: Every day | ORAL | 1 refills | Status: DC

## 2015-10-07 MED ORDER — POTASSIUM CHLORIDE CRYS ER 20 MEQ PO TBCR: 20.0000 meq | EXTENDED_RELEASE_TABLET | Freq: Every day | ORAL | 1 refills | Status: DC

## 2015-10-07 MED ORDER — HYDROCHLOROTHIAZIDE 25 MG PO TABS: 25.0000 mg | ORAL_TABLET | Freq: Every day | ORAL | 1 refills | Status: DC

## 2015-10-07 MED ORDER — RANITIDINE HCL 150 MG PO TABS: 150.0000 mg | ORAL_TABLET | Freq: Two times a day (BID) | ORAL | 1 refills | Status: DC

## 2015-10-07 NOTE — Assessment & Plan Note (Signed)
BP up today. Suspect that it is secondary to missing her dose yesterday. Will have pt return in 2 weeks for nurse visit follow up bp check.

## 2015-10-07 NOTE — Progress Notes (Signed)
Pre visit review using our clinic review tool, if applicable. No additional management support is needed unless otherwise documented below in the visit note. 

## 2015-10-07 NOTE — Assessment & Plan Note (Signed)
Stable on zantac, continue same.  

## 2015-10-07 NOTE — Assessment & Plan Note (Signed)
Migraines are infrequent, continue prn relpax.

## 2015-10-07 NOTE — Progress Notes (Signed)
Subjective:    Patient ID: [name], female    DOB: 1965-08-23, 50 y.o.   MRN: [id number]  HPI  [name] is a 50 yr old female who presents today for follow up.  1) HTN-  Maintained on hctz, lisinopril and bystolic. She is also maintained on kdur.  She reports that she forgot her medication yesterday. Took today's dose about 1.5 hrs ago.  BP Readings from Last 3 Encounters:  10/07/15 (!) 171/90  08/19/15 102/83  07/01/15 120/86   2) GERD- patient is current maintained on zantac. Reports symptoms are well controlled.    3) Migraine-  Uses relpax on a prn basis. Reports 1 migraine last week but overall have been well controlled.  Resolved with relpax.   She will be getting a flu shot from work next week.    Review of Systems    see HPI  Past Medical History:  Diagnosis Date  . Allergy   . Anemia   . Arthritis    left knee  . Headache(784.0)    Chronic migraines--Dr Lewitt  . Hypertension   . Meckel's diverticulum 08/2004   with ischemia  . Myoma 11/2005   right ovary  . Sacroiliac dysfunction 06/23/2012  . Uterine adhesions      Social History   Social History  . Marital status: Single    Spouse name: N/A  . Number of children: 3  . Years of education: N/A   Occupational History  . Cashier    Social History Main Topics  . Smoking status: Never Smoker  . Smokeless tobacco: Never Used  . Alcohol use Yes     Comment: social  . Drug use: No  . Sexual activity: Yes   Other Topics Concern  . Not on file   Social History Narrative   Lives with daughter    Daughter- lives in [place]- lives locally   Works at [employer]   Completed HS   No pets   Enjoys walking, mo   vies, Psychologist, clinical, bowling    Past Surgical History:  Procedure Laterality Date  . ABDOMINAL HYSTERECTOMY  11/2005  . bowel reconstruction    . LAPAROSCOPIC SALPINGOOPHERECTOMY  11/2005   right-- assisted with daVinci robot, and lysis of omental adhesions.  Marland Kitchen LAPAROSCOPY  11/2005     total laparoscopy  . OTHER SURGICAL HISTORY     Meckel's diverticulectomy, small bowel resection and resection of retrocecal appendix.    Family History  Problem Relation Age of Onset  . Hypertension Other   . Cancer Neg Hx   . Colon cancer Neg Hx   . Esophageal cancer Neg Hx   . Rectal cancer Neg Hx   . Stomach cancer Neg Hx   . Pancreatic cancer Neg Hx     No Known Allergies  Current Outpatient Prescriptions on File Prior to Visit  Medication Sig Dispense Refill  . eletriptan (RELPAX) 40 MG tablet Take 1 tablet (40 mg total) by mouth as needed. may repeat in 2 hours if necessary 10 tablet 5  . hydrochlorothiazide (HYDRODIURIL) 25 MG tablet Take 1 tablet (25 mg total) by mouth daily. 30 tablet 5  . lisinopril (PRINIVIL,ZESTRIL) 20 MG tablet Take 1 tablet (20 mg total) by mouth daily. 90 tablet 1  . nebivolol (BYSTOLIC) 5 MG tablet Take 1 tablet (5 mg total) by mouth daily. 90 tablet 1  . potassium chloride SA (K-DUR,KLOR-CON) 20 MEQ tablet Take 1 tablet (20 mEq total) by mouth  daily. 30 tablet 5  . ranitidine (ZANTAC) 150 MG tablet Take 1 tablet (150 mg total) by mouth 2 (two) times daily. 60 tablet 5   Current Facility-Administered Medications on File Prior to Visit  Medication Dose Route Frequency Provider Last Rate Last Dose  . 0.9 %  sodium chloride infusion  500 mL Intravenous Continuous Nelida Meuse III, MD        BP (!) 171/90 (BP Location: Right Arm, Cuff Size: Large)   Pulse (!) 58   Temp 99.1 F (37.3 C) (Oral)   Resp 18   Ht 5\' 3"  (1.6 m)   Wt 175 lb 9.6 oz (79.7 kg)   LMP 11/10/2005   SpO2 97% Comment: room air  BMI 31.11 kg/m    Objective:   Physical Exam  Constitutional: She is oriented to person, place, and time. She appears well-developed and well-nourished.  HENT:  Head: Normocephalic and atraumatic.  Cardiovascular: Normal rate, regular rhythm and normal heart sounds.   No murmur heard. Pulmonary/Chest: Effort normal and breath sounds normal.  No respiratory distress. She has no wheezes.  Neurological: She is alert and oriented to person, place, and time.  Psychiatric: She has a normal mood and affect. Her behavior is normal. Judgment and thought content normal.          Assessment & Plan:

## 2015-10-07 NOTE — Patient Instructions (Signed)
Please complete blood work prior to leaving.

## 2015-10-08 ENCOUNTER — Encounter: Payer: Self-pay | Admitting: Family

## 2015-10-08 LAB — HIV ANTIBODY (ROUTINE TESTING W REFLEX): HIV 1&2 Ab, 4th Generation: NONREACTIVE

## 2015-10-21 ENCOUNTER — Ambulatory Visit (INDEPENDENT_AMBULATORY_CARE_PROVIDER_SITE_OTHER): Payer: Managed Care, Other (non HMO) | Admitting: Family

## 2015-10-21 VITALS — BP 132/78 | HR 69

## 2015-10-21 DIAGNOSIS — I10 Essential (primary) hypertension: Secondary | ICD-10-CM

## 2015-10-21 NOTE — Progress Notes (Signed)
Pre visit review using our clinic review tool, if applicable. No additional management support is needed unless otherwise documented below in the visit note.  Patient in office for blood pressure check per OV note 10/07/15. Reviewed medications & current regimen with the patient. Today's reading was BP 132/78 P 69.  Per Inda Castle, NP: Continue current medication regimen and follow-up with PCP in 3 months for an office visit.  Informed patient of the provider's instructions. She verbalized understanding. Next appointment has been scheduled for 01/27/16 at 7:00 AM.

## 2015-10-21 NOTE — Patient Instructions (Signed)
Per Inda Castle, NP: Continue current medication regimen and follow-up with PCP in 3 months for an office visit.

## 2015-10-22 NOTE — Progress Notes (Signed)
Noted and agree. 

## 2015-10-23 ENCOUNTER — Encounter (HOSPITAL_COMMUNITY): Payer: Self-pay | Admitting: Emergency Medicine

## 2015-10-23 ENCOUNTER — Emergency Department (HOSPITAL_COMMUNITY): Payer: Managed Care, Other (non HMO)

## 2015-10-23 ENCOUNTER — Emergency Department (HOSPITAL_COMMUNITY)
Admission: EM | Admit: 2015-10-23 | Discharge: 2015-10-23 | Disposition: A | Discharge status: Home / Self Care | Payer: Managed Care, Other (non HMO) | Attending: Emergency Medicine | Admitting: Emergency Medicine

## 2015-10-23 DIAGNOSIS — R072 Precordial pain: Secondary | ICD-10-CM | POA: Diagnosis present

## 2015-10-23 DIAGNOSIS — R0789 Other chest pain: Secondary | ICD-10-CM | POA: Diagnosis not present

## 2015-10-23 DIAGNOSIS — I10 Essential (primary) hypertension: Secondary | ICD-10-CM | POA: Diagnosis not present

## 2015-10-23 DIAGNOSIS — R079 Chest pain, unspecified: Secondary | ICD-10-CM

## 2015-10-23 LAB — BASIC METABOLIC PANEL
Anion gap: 7 (ref 5–15)
BUN: 14 mg/dL (ref 6–20)
CO2: 31 mmol/L (ref 22–32)
Calcium: 9.4 mg/dL (ref 8.9–10.3)
Chloride: 101 mmol/L (ref 101–111)
Creatinine, Ser: 0.95 mg/dL (ref 0.44–1.00)
GFR calc Af Amer: >60 mL/min (ref >60)
GFR calc non Af Amer: >60 mL/min (ref >60)
Glucose, Bld: 89 mg/dL (ref 65–99)
Potassium: 3.5 mmol/L (ref 3.5–5.1)
Sodium: 139 mmol/L (ref 135–145)

## 2015-10-23 LAB — CBC WITH DIFFERENTIAL/PLATELET
Basophils Absolute: 0 10*3/uL (ref 0.0–0.1)
Basophils Relative: 1 %
Eosinophils Absolute: 0.1 10*3/uL (ref 0.0–0.7)
Eosinophils Relative: 4 %
HCT: 38.3 % (ref 36.0–46.0)
Hemoglobin: 13 g/dL (ref 12.0–15.0)
Lymphocytes Relative: 30 %
Lymphs Abs: 1 10*3/uL (ref 0.7–4.0)
MCH: 29.2 pg (ref 26.0–34.0)
MCHC: 33.9 g/dL (ref 30.0–36.0)
MCV: 86.1 fL (ref 78.0–100.0)
Monocytes Absolute: 0.3 10*3/uL (ref 0.1–1.0)
Monocytes Relative: 10 %
Neutro Abs: 1.9 10*3/uL (ref 1.7–7.7)
Neutrophils Relative %: 55 %
Platelets: 251 10*3/uL (ref 150–400)
RBC: 4.45 MIL/uL (ref 3.87–5.11)
RDW: 12 % (ref 11.5–15.5)
WBC: 3.4 10*3/uL — ABNORMAL LOW (ref 4.0–10.5)

## 2015-10-23 LAB — I-STAT TROPONIN, ED
Troponin i, poc: 0 ng/mL (ref 0.00–0.08)
Troponin i, poc: 0 ng/mL (ref 0.00–0.08)

## 2015-10-23 NOTE — Discharge Instructions (Signed)
You may take over the counter pain medications for your chest pain. Please schedule an appointment to see cardiology. Please also follow up with your primary care

## 2015-10-23 NOTE — ED Triage Notes (Signed)
Per EMS pt began having centralized chest pain that radiates down her right arm last night.  She was given 324 ASA and 2 nitro in route which decreased her pain from a 8/10 to a 2/10.  12 lead unremarkable, no history of cardiac problems.

## 2015-10-23 NOTE — ED Provider Notes (Signed)
[facility] DEPT Provider Note   CSN: [id number] Arrival date & time: 10/23/15  [id number]     History   Chief Complaint Chief Complaint  Patient presents with  . Chest Pain    HPI [name] is a 50 year old woman with history of hypertension, GERD.  HPI  She presents with chest pain. Started at 9PM. The arm is midsternal and radiates across the right side of her chest and down her right arm. The pain felt like pressure. Intermittent prior to falling asleep and then was constant when she woke up. Breathing and moving makes it worse. Resting makes it better. She tried to take Aleve with no relief. Felt warm but no associated diaphoresis, nausea, or dyspnea. Had similar pain before a month ago when she pulled a muscle. Does not hurt with lifting arm like prior however. No recent illness or sick contacts. No recent travel. No leg pains. She was given 324mg  ASA and 2 nitro which decreased her pain from an 8/10 to 2/10.   No chest pain or dyspnea with walking long distances or going up stairs. She has reflux which is controlled.  No family history of heart disease. No family history of sudden death or unexplained death.  Past Medical History:  Diagnosis Date  . Allergy   . Anemia   . Arthritis    left knee  . Headache(784.0)    Chronic migraines--Dr Lewitt  . Hypertension   . Meckel's diverticulum 08/2004   with ischemia  . Myoma 11/2005   right ovary  . Sacroiliac dysfunction 06/23/2012  . Uterine adhesions     Patient Active Problem List   Diagnosis Date Noted  . GERD (gastroesophageal reflux disease) 07/01/2015  . Preventative health care 07/25/2014  . SKIN LESION 12/11/2008  . Migraine 02/15/2008  . Essential hypertension 02/14/2008    Past Surgical History:  Procedure Laterality Date  . ABDOMINAL HYSTERECTOMY  11/2005  . bowel reconstruction    . LAPAROSCOPIC SALPINGOOPHERECTOMY  11/2005   right-- assisted with daVinci robot, and lysis of omental adhesions.  Marland Kitchen  LAPAROSCOPY  11/2005   total laparoscopy  . OTHER SURGICAL HISTORY     Meckel's diverticulectomy, small bowel resection and resection of retrocecal appendix.    OB History    No data available       Home Medications    Prior to Admission medications   Medication Sig Start Date End Date Taking? Authorizing Provider  eletriptan (RELPAX) 40 MG tablet Take 1 tablet (40 mg total) by mouth as needed. may repeat in 2 hours if necessary Patient taking differently: Take 40 mg by mouth as needed for migraine. may repeat in 2 hours if necessary 06/11/14  Yes Debbrah Alar, NP  hydrochlorothiazide (HYDRODIURIL) 25 MG tablet Take 1 tablet (25 mg total) by mouth daily. 10/07/15  Yes Debbrah Alar, NP  lisinopril (PRINIVIL,ZESTRIL) 20 MG tablet Take 1 tablet (20 mg total) by mouth daily. 10/07/15  Yes Debbrah Alar, NP  nebivolol (BYSTOLIC) 5 MG tablet Take 1 tablet (5 mg total) by mouth daily. 10/07/15  Yes Debbrah Alar, NP  potassium chloride SA (K-DUR,KLOR-CON) 20 MEQ tablet Take 1 tablet (20 mEq total) by mouth daily. 10/07/15  Yes Debbrah Alar, NP  ranitidine (ZANTAC) 150 MG tablet Take 1 tablet (150 mg total) by mouth 2 (two) times daily. 10/07/15  Yes Debbrah Alar, NP    Family History Family History  Problem Relation Age of Onset  . Hypertension Other   . Cancer Neg Hx   .  Colon cancer Neg Hx   . Esophageal cancer Neg Hx   . Rectal cancer Neg Hx   . Stomach cancer Neg Hx   . Pancreatic cancer Neg Hx     Social History Social History  Substance Use Topics  . Smoking status: Never Smoker  . Smokeless tobacco: Never Used  . Alcohol use Yes     Comment: social     Allergies   Review of patient's allergies indicates no known allergies.   Review of Systems Review of Systems Constitutional: no fevers/chills Eyes: no vision changes Ears, nose, mouth, throat, and face: + a little cough Respiratory: no shortness of breath Cardiovascular: +chest  pain Gastrointestinal: no nausea/vomiting, +mild sharp abdominal pain in RLQ, no constipation, no diarrhea Genitourinary: no dysuria, no hematuria Integument: no rash Hematologic/lymphatic: no bleeding/bruising, no edema Musculoskeletal: no arthralgias, no myalgias Neurological: no paresthesias, no weakness   Physical Exam Updated Vital Signs BP 131/74 (BP Location: Right Arm)   Pulse (!) 49   Temp 98.3 F (36.8 C) (Oral)   Resp 18   Ht 5\' 3"  (1.6 m)   Wt 74.8 kg   LMP 11/10/2005   SpO2 98%   BMI 29.23 kg/m   Physical Exam General Apperance: NAD Head: Normocephalic, atraumatic Eyes: PERRL, EOMI, anicteric sclera Ears: Normal external ear canal Nose: Nares normal, septum midline, mucosa normal Throat: Lips, mucosa and tongue normal  Neck: Supple, trachea midline Back: No tenderness or bony abnormality  Lungs: Clear to auscultation bilaterally. No wheezes, rhonchi or rales. Breathing comfortably Chest Wall: Tender to palpation across right chest Heart: Regular rate and rhythm, no murmur/rub/gallop Abdomen: Soft, nontender, nondistended, no rebound/guarding Extremities: Normal, atraumatic, warm and well perfused, no edema Pulses: 2+ throughout Skin: No rashes or lesions Neurologic: Alert and oriented x 3. CNII-XII intact. Normal strength and sensation   ED Treatments / Results  Labs (all labs ordered are listed, but only abnormal results are displayed) Labs Reviewed  CBC WITH DIFFERENTIAL/PLATELET - Abnormal; Notable for the following:       Result Value   WBC 3.4 (*)    All other components within normal limits  BASIC METABOLIC PANEL  I-STAT TROPOININ, ED  Randolm Idol, ED  Randolm Idol, ED    EKG  EKG Interpretation  Date/Time:  Friday October 23 2015 06:41:33 EDT Ventricular Rate:  50 PR Interval:    QRS Duration: 96 QT Interval:  424 QTC Calculation: 387 R Axis:   -8 Text Interpretation:  Sinus rhythm Abnormal R-wave progression, early  transition No significant change since last tracing Confirmed by WARD,  DO, KRISTEN [id number]) on 10/23/2015 6:55:05 AM Also confirmed by WARD,  DO, KRISTEN [phone]), editor [place] CT, Leda Gauze [phone])  on 10/23/2015 7:06:05 AM      Normal sinus rhythm, T wave inversion in III, unchanged from previous EKG  Radiology Dg Chest 2 View  Result Date: 10/23/2015 CLINICAL DATA:  Chest pain since last night with right arm numbness. History of medicated hypertension EXAM: CHEST  2 VIEW COMPARISON:  PA and lateral chest x-ray of November 22, 2015 FINDINGS: The lungs are well-expanded and clear. There is no pleural effusion or pneumothorax. The heart and pulmonary vascularity are normal. Hilar prominence persists consistent with lymphadenopathy. The bony thorax exhibits no acute abnormality. IMPRESSION: No CHF nor pneumonia.  Stable bilateral hilar lymphadenopathy. Electronically Signed   By: David  Martinique M.D.   On: 10/23/2015 07:34   Procedures Medications Ordered in ED Medications - No data to display  Initial Impression / Assessment and Plan / ED Course  I have reviewed the triage vital signs and the nursing notes.  Pertinent labs & imaging results that were available during my care of the patient were reviewed by me and considered in my medical decision making (see chart for details).  Clinical Course   7:15am evaluated pt  Final Clinical Impressions(s) / ED Diagnoses   Final diagnoses:  Chest pain, unspecified type   Chest pain: Pain is reproducible on palpation making MSK etiology possible. CXR with no acute cardiopulmonary process. Wells score 0 and Geneva score 0 making her low risk for PE. Initial troponin negative and EKG without acute ischemic changes. Second troponin negative. HEART score 4.  - Follow up with cardiology as outpatient - Follow up with PCP  New Prescriptions New Prescriptions   No medications on file     Milagros Loll, MD 10/23/15 1041    Leo Grosser,  MD 10/23/15 Vernelle Emerald

## 2015-11-09 ENCOUNTER — Telehealth: Payer: Self-pay | Admitting: Family

## 2015-11-09 NOTE — Telephone Encounter (Signed)
Health maintenance updated

## 2015-11-09 NOTE — Telephone Encounter (Signed)
Patient calling to inform Lenna Sciara that she received her flu shot today.

## 2015-12-01 ENCOUNTER — Ambulatory Visit (INDEPENDENT_AMBULATORY_CARE_PROVIDER_SITE_OTHER): Payer: Managed Care, Other (non HMO) | Admitting: Family Medicine

## 2015-12-01 VITALS — BP 128/76 | HR 89 | Temp 98.7°F | Resp 16 | Ht 63.0 in | Wt 170.0 lb

## 2015-12-01 DIAGNOSIS — I10 Essential (primary) hypertension: Secondary | ICD-10-CM | POA: Diagnosis not present

## 2015-12-01 DIAGNOSIS — R05 Cough: Secondary | ICD-10-CM

## 2015-12-01 DIAGNOSIS — M791 Myalgia, unspecified site: Secondary | ICD-10-CM

## 2015-12-01 DIAGNOSIS — J029 Acute pharyngitis, unspecified: Secondary | ICD-10-CM | POA: Diagnosis not present

## 2015-12-01 DIAGNOSIS — R059 Cough, unspecified: Secondary | ICD-10-CM

## 2015-12-01 MED ORDER — GUAIFENESIN-CODEINE 100-10 MG/5ML PO SOLN: 10.0000 mL | Freq: Three times a day (TID) | ORAL | 0 refills | Status: DC | PRN

## 2015-12-01 MED ORDER — BENZONATATE 200 MG PO CAPS: 200.0000 mg | ORAL_CAPSULE | Freq: Two times a day (BID) | ORAL | 0 refills | Status: DC | PRN

## 2015-12-01 NOTE — Progress Notes (Signed)
Chief Complaint  Patient presents with  . Cough    x 2 days   . Generalized Body Aches    x 2 days  . Emesis    x 1 day    HPI  Upper Respiratory Infection: Patient complains of symptoms of a URI. Symptoms include congestion, cough, sore throat, swollen glands and vomiting. Onset of symptoms was 2 days ago, gradually worsening since that time. She also c/o achiness for the past 2 days .  She is drinking plenty of fluids. Evaluation to date: none. Treatment to date: antihistamines and cough suppressants.   She takes her bp meds daily She has not taken anything to raise her bp No nsaids  BP Readings from Last 3 Encounters:  12/01/15 128/76  10/23/15 125/77  10/21/15 132/78       Past Medical History:  Diagnosis Date  . Allergy   . Anemia   . Arthritis    left knee  . Headache(784.0)    Chronic migraines--Dr Lewitt  . Hypertension   . Meckel's diverticulum 08/2004   with ischemia  . Myoma 11/2005   right ovary  . Sacroiliac dysfunction 06/23/2012  . Uterine adhesions     Current Outpatient Prescriptions  Medication Sig Dispense Refill  . eletriptan (RELPAX) 40 MG tablet Take 1 tablet (40 mg total) by mouth as needed. may repeat in 2 hours if necessary (Patient taking differently: Take 40 mg by mouth as needed for migraine. may repeat in 2 hours if necessary) 10 tablet 5  . hydrochlorothiazide (HYDRODIURIL) 25 MG tablet Take 1 tablet (25 mg total) by mouth daily. 90 tablet 1  . lisinopril (PRINIVIL,ZESTRIL) 20 MG tablet Take 1 tablet (20 mg total) by mouth daily. 90 tablet 1  . nebivolol (BYSTOLIC) 5 MG tablet Take 1 tablet (5 mg total) by mouth daily. 90 tablet 1  . potassium chloride SA (K-DUR,KLOR-CON) 20 MEQ tablet Take 1 tablet (20 mEq total) by mouth daily. 90 tablet 1  . ranitidine (ZANTAC) 150 MG tablet Take 1 tablet (150 mg total) by mouth 2 (two) times daily. 180 tablet 1  . benzonatate (TESSALON) 200 MG capsule Take 1 capsule (200 mg total) by mouth 2 (two)  times daily as needed for cough. 60 capsule 0  . guaiFENesin-codeine 100-10 MG/5ML syrup Take 10 mLs by mouth 3 (three) times daily as needed for cough. 120 mL 0   Current Facility-Administered Medications  Medication Dose Route Frequency Provider Last Rate Last Dose  . 0.9 %  sodium chloride infusion  500 mL Intravenous Continuous Nelida Meuse III, MD        Allergies: No Known Allergies  Past Surgical History:  Procedure Laterality Date  . ABDOMINAL HYSTERECTOMY  11/2005  . bowel reconstruction    . LAPAROSCOPIC SALPINGOOPHERECTOMY  11/2005   right-- assisted with daVinci robot, and lysis of omental adhesions.  Marland Kitchen LAPAROSCOPY  11/2005   total laparoscopy  . OTHER SURGICAL HISTORY     Meckel's diverticulectomy, small bowel resection and resection of retrocecal appendix.    Social History   Social History  . Marital status: Single    Spouse name: N/A  . Number of children: 3  . Years of education: N/A   Occupational History  . Cashier    Social History Main Topics  . Smoking status: Never Smoker  . Smokeless tobacco: Never Used  . Alcohol use Yes     Comment: social  . Drug use: No  . Sexual activity: Yes  Other Topics Concern  . None   Social History Narrative   Lives with daughter    Daughter- lives in [place]- lives locally   Works at [employer]   Completed HS   No pets   Enjoys walking, mo   vies, Psychologist, clinical, bowling    Review of Systems  HENT: Negative for hearing loss and tinnitus.   Respiratory: Negative for cough and wheezing.   Cardiovascular: Negative for chest pain and palpitations.  Gastrointestinal: Positive for nausea and vomiting. Negative for abdominal pain, constipation and diarrhea.  Skin: Negative for itching and rash.  Neurological: Negative for dizziness and headaches.  Psychiatric/Behavioral: Negative for depression. The patient is not nervous/anxious.     Objective: Vitals:   12/01/15 0952  BP: 128/76  Pulse: 89  Resp: 16    Temp: 98.7 F (37.1 C)  TempSrc: Oral  SpO2: 99%  Weight: 170 lb (77.1 kg)  Height: 5\' 3"  (1.6 m)    Physical Exam General: alert, oriented, in NAD Head: normocephalic, atraumatic, no sinus tenderness Eyes: EOM intact, no scleral icterus or conjunctival injection Ears: TM clear bilaterally Throat: no pharyngeal exudate or erythema Lymph: no posterior auricular, submental or cervical lymph adenopathy Heart: normal rate, normal sinus rhythm, no murmurs Lungs: clear to auscultation bilaterally, no wheezing Musculoskeletal: muscles spasms with coughing spells noted    Assessment and Plan [name] was seen today for cough, generalized body aches and emesis.  Diagnoses and all orders for this visit:  Myalgia Cough Acute pharyngitis, unspecified etiology  Pt advised to take tessalon for cough on the days that she is working She should take guaifenesin-codeine for cough especially at night or not working  Other orders -     benzonatate (TESSALON) 200 MG capsule; Take 1 capsule (200 mg total) by mouth 2 (two) times daily as needed for cough. -     guaiFENesin-codeine 100-10 MG/5ML syrup; Take 10 mLs by mouth 3 (three) times daily as needed for cough.  Essential hypertension- continue bp meds    A Nolon Rod

## 2015-12-01 NOTE — Patient Instructions (Addendum)
     IF you received an x-ray today, you will receive an invoice from [facility] Rad[facility]. Please contact [facility] at [phone] with questions or concerns regarding your invoice.   IF you received labwork today, you will receive an invoice from [facility]. Please contact [facility] at [phone] with questions or concerns regarding your invoice.   Our billing staff will not be able to assist you with questions regarding bills from these companies.  You will be contacted with the lab results as soon as they are available. The fastest way to get your results is to activate your My Chart account. Instructions are located on the last page of this paperwork. If you have not heard from [place] regarding the results in 2 weeks, please contact this office.      Cough, Adult Introduction A cough helps to clear your throat and lungs. A cough may last only 2-3 weeks (acute), or it may last longer than 8 weeks (chronic). Many different things can cause a cough. A cough may be a sign of an illness or another medical condition. Follow these instructions at home:  Pay attention to any changes in your cough.  Take medicines only as told by your doctor.  If you were prescribed an antibiotic medicine, take it as told by your doctor. Do not stop taking it even if you start to feel better.  Talk with your doctor before you try using a cough medicine.  Drink enough fluid to keep your pee (urine) clear or pale yellow.  If the air is dry, use a cold steam vaporizer or humidifier in your home.  Stay away from things that make you cough at work or at home.  If your cough is worse at night, try using extra pillows to raise your head up higher while you sleep.  Do not smoke, and try not to be around smoke. If you need help quitting, ask your doctor.  Do not have caffeine.  Do not drink alcohol.  Rest as needed. Contact a doctor if:  You have new problems  (symptoms).  You cough up yellow fluid (pus).  Your cough does not get better after 2-3 weeks, or your cough gets worse.  Medicine does not help your cough and you are not sleeping well.  You have pain that gets worse or pain that is not helped with medicine.  You have a fever.  You are losing weight and you do not know why.  You have night sweats. Get help right away if:  You cough up blood.  You have trouble breathing.  Your heartbeat is very fast. This information is not intended to replace advice given to you by your health care provider. Make sure you discuss any questions you have with your health care provider. Document Released: 09/09/2010 Document Revised: 06/04/2015 Document Reviewed: 03/05/2014  2017 Elsevier

## 2015-12-16 ENCOUNTER — Other Ambulatory Visit: Payer: Self-pay | Admitting: Dermatology

## 2016-01-27 ENCOUNTER — Ambulatory Visit: Payer: Managed Care, Other (non HMO) | Admitting: Family

## 2016-02-10 ENCOUNTER — Encounter: Payer: Self-pay | Admitting: Family

## 2016-02-10 ENCOUNTER — Ambulatory Visit (INDEPENDENT_AMBULATORY_CARE_PROVIDER_SITE_OTHER): Payer: Managed Care, Other (non HMO) | Admitting: Family

## 2016-02-10 VITALS — BP 128/65 | HR 67 | Temp 98.5°F | Ht 63.0 in | Wt 168.6 lb

## 2016-02-10 DIAGNOSIS — K219 Gastro-esophageal reflux disease without esophagitis: Secondary | ICD-10-CM

## 2016-02-10 DIAGNOSIS — G43809 Other migraine, not intractable, without status migrainosus: Secondary | ICD-10-CM | POA: Diagnosis not present

## 2016-02-10 DIAGNOSIS — B349 Viral infection, unspecified: Secondary | ICD-10-CM

## 2016-02-10 DIAGNOSIS — I10 Essential (primary) hypertension: Secondary | ICD-10-CM | POA: Diagnosis not present

## 2016-02-10 LAB — BASIC METABOLIC PANEL
BUN: 15 mg/dL (ref 6–23)
CO2: 31 mEq/L (ref 19–32)
Calcium: 9.6 mg/dL (ref 8.4–10.5)
Chloride: 100 mEq/L (ref 96–112)
Creatinine, Ser: 0.8 mg/dL (ref 0.40–1.20)
GFR: 97.16 mL/min (ref >60.00)
Glucose, Bld: 86 mg/dL (ref 70–99)
Potassium: 3.5 mEq/L (ref 3.5–5.1)
Sodium: 136 mEq/L (ref 135–145)

## 2016-02-10 NOTE — Progress Notes (Signed)
Pre visit review using our clinic review tool, if applicable. No additional management support is needed unless otherwise documented below in the visit note. 

## 2016-02-10 NOTE — Assessment & Plan Note (Signed)
Stable.  Continue current medications.

## 2016-02-10 NOTE — Progress Notes (Signed)
Subjective:    Patient ID: [name], female    DOB: 1965/06/16, 51 y.o.   MRN: [id number]  HPI  [name] is a 51 yr old female who presents today for follow up.  HTN-  She is currently maintained on lisinopril 20mg , bystoic 5mg .  Denies CP/SOB or swelling.   BP Readings from Last 3 Encounters:  02/10/16 128/65  12/01/15 128/76  10/23/15 125/77   GERD- maintained on zantac bid. Reports reflux is well controlled on zantac.  Migraines- Reports that her migraines are well controlled.  She reports that she had a migraine 1 week ago.  She sees Dr. Melton Alar and has an upcoming apt on 2/14.    She reports that she developed body aches on 02/04/15 with congestion and sore throat. Body aches have resolved, sore throat is improved, now just has residual cough and mild sore throat- but cough is improving.   Review of Systems See HPI  Past Medical History:  Diagnosis Date  . Allergy   . Anemia   . Arthritis    left knee  . Headache(784.0)    Chronic migraines--Dr Lewitt  . Hypertension   . Meckel's diverticulum 08/2004   with ischemia  . Myoma 11/2005   right ovary  . Sacroiliac dysfunction 06/23/2012  . Uterine adhesions      Social History   Social History  . Marital status: Single    Spouse name: N/A  . Number of children: 3  . Years of education: N/A   Occupational History  . Cashier    Social History Main Topics  . Smoking status: Never Smoker  . Smokeless tobacco: Never Used  . Alcohol use Yes     Comment: social  . Drug use: No  . Sexual activity: Yes   Other Topics Concern  . Not on file   Social History Narrative   Lives with daughter    Daughter- lives in [place]- lives locally   Works at [employer]   Completed HS   No pets   Enjoys walking, mo   vies, Psychologist, clinical, bowling    Past Surgical History:  Procedure Laterality Date  . ABDOMINAL HYSTERECTOMY  11/2005  . bowel reconstruction    . LAPAROSCOPIC SALPINGOOPHERECTOMY  11/2005   right--  assisted with daVinci robot, and lysis of omental adhesions.  Marland Kitchen LAPAROSCOPY  11/2005   total laparoscopy  . OTHER SURGICAL HISTORY     Meckel's diverticulectomy, small bowel resection and resection of retrocecal appendix.    Family History  Problem Relation Age of Onset  . Hypertension Other   . Cancer Neg Hx   . Colon cancer Neg Hx   . Esophageal cancer Neg Hx   . Rectal cancer Neg Hx   . Stomach cancer Neg Hx   . Pancreatic cancer Neg Hx     No Known Allergies  Current Outpatient Prescriptions on File Prior to Visit  Medication Sig Dispense Refill  . benzonatate (TESSALON) 200 MG capsule Take 1 capsule (200 mg total) by mouth 2 (two) times daily as needed for cough. 60 capsule 0  . eletriptan (RELPAX) 40 MG tablet Take 1 tablet (40 mg total) by mouth as needed. may repeat in 2 hours if necessary (Patient taking differently: Take 40 mg by mouth as needed for migraine. may repeat in 2 hours if necessary) 10 tablet 5  . hydrochlorothiazide (HYDRODIURIL) 25 MG tablet Take 1 tablet (25 mg total) by mouth daily. 90 tablet 1  .  lisinopril (PRINIVIL,ZESTRIL) 20 MG tablet Take 1 tablet (20 mg total) by mouth daily. 90 tablet 1  . nebivolol (BYSTOLIC) 5 MG tablet Take 1 tablet (5 mg total) by mouth daily. 90 tablet 1  . potassium chloride SA (K-DUR,KLOR-CON) 20 MEQ tablet Take 1 tablet (20 mEq total) by mouth daily. 90 tablet 1  . ranitidine (ZANTAC) 150 MG tablet Take 1 tablet (150 mg total) by mouth 2 (two) times daily. 180 tablet 1  . guaiFENesin-codeine 100-10 MG/5ML syrup Take 10 mLs by mouth 3 (three) times daily as needed for cough. (Patient not taking: Reported on 02/10/2016) 120 mL 0   Current Facility-Administered Medications on File Prior to Visit  Medication Dose Route Frequency Provider Last Rate Last Dose  . 0.9 %  sodium chloride infusion  500 mL Intravenous Continuous Nelida Meuse III, MD        BP 128/65 (BP Location: Right Arm, Patient Position: Sitting, Cuff Size:  Normal)   Pulse 67   Temp 98.5 F (36.9 C) (Oral)   Ht 5\' 3"  (1.6 m)   Wt 168 lb 9.6 oz (76.5 kg)   LMP 11/10/2005   SpO2 100%   BMI 29.87 kg/m       Objective:   Physical Exam  Constitutional: She is oriented to person, place, and time. She appears well-developed and well-nourished.  HENT:  Head: Normocephalic and atraumatic.  Right Ear: Tympanic membrane and ear canal normal.  Left Ear: Tympanic membrane and ear canal normal.  Mouth/Throat: No oropharyngeal exudate, posterior oropharyngeal edema or posterior oropharyngeal erythema.  Cardiovascular: Normal rate, regular rhythm and normal heart sounds.   No murmur heard. Pulmonary/Chest: Effort normal and breath sounds normal. No respiratory distress. She has no wheezes.  Musculoskeletal: She exhibits no edema.  Neurological: She is alert and oriented to person, place, and time.  Skin: Skin is warm and dry.  Psychiatric: She has a normal mood and affect. Her behavior is normal. Judgment and thought content normal.          Assessment & Plan:  Viral illness- resolving, advised pt to try delsym prn cough and to let us know if symptoms do not continue to improve.

## 2016-02-10 NOTE — Patient Instructions (Addendum)
Please complete lab work prior to leaving. For your cough- you may try OTC Delsym. Let us know if your symptoms worsen or if they do not continue to improve. Try stopping zantac and watching your diet. If reflux symptoms return, you may restart zantac.

## 2016-02-10 NOTE — Assessment & Plan Note (Signed)
Stable.  Not clear that she needs to continue zantac long term. Will give trial off of zantac to see how she does.

## 2016-02-10 NOTE — Assessment & Plan Note (Signed)
Overall controlled, management per neuro (Dr. Melton Alar).

## 2016-02-18 ENCOUNTER — Telehealth: Payer: Self-pay | Admitting: Family

## 2016-02-18 NOTE — Telephone Encounter (Signed)
Caller name: Relationship to patient: Self Can be reached:[phone] or [phone] Pharmacy:  [facility] [address], [place] [phone] (Phone) [phone] (Fax)     Reason for call: Patient states she was given something last week for her cold, but she is no better. Request to have something else called in.

## 2016-02-19 MED ORDER — AZITHROMYCIN 250 MG PO TABS: ORAL_TABLET | ORAL | 0 refills | Status: DC

## 2016-02-19 NOTE — Telephone Encounter (Signed)
Notified pt and she voices understanding. 

## 2016-02-19 NOTE — Telephone Encounter (Signed)
Rx sent for Zpak. If not improved by Monday, she needs re-evaluation in the office please.

## 2016-04-26 ENCOUNTER — Other Ambulatory Visit: Payer: Self-pay | Admitting: Family Medicine

## 2016-04-26 ENCOUNTER — Ambulatory Visit
Admission: RE | Admit: 2016-04-26 | Discharge: 2016-04-26 | Disposition: A | Discharge status: Home / Self Care | Payer: Managed Care, Other (non HMO) | Source: Ambulatory Visit | Attending: Family Medicine | Admitting: Family Medicine

## 2016-04-26 DIAGNOSIS — Z8719 Personal history of other diseases of the digestive system: Secondary | ICD-10-CM

## 2016-04-26 DIAGNOSIS — R1031 Right lower quadrant pain: Secondary | ICD-10-CM

## 2016-05-17 ENCOUNTER — Inpatient Hospital Stay (HOSPITAL_COMMUNITY)
Admission: AD | Admit: 2016-05-17 | Discharge: 2016-05-17 | Disposition: A | Discharge status: Home / Self Care | Payer: Managed Care, Other (non HMO) | Source: Ambulatory Visit | Attending: Gynecology | Admitting: Gynecology

## 2016-05-17 ENCOUNTER — Encounter (HOSPITAL_COMMUNITY): Payer: Self-pay

## 2016-05-17 DIAGNOSIS — Z9889 Other specified postprocedural states: Secondary | ICD-10-CM | POA: Diagnosis not present

## 2016-05-17 DIAGNOSIS — R1031 Right lower quadrant pain: Secondary | ICD-10-CM | POA: Diagnosis not present

## 2016-05-17 DIAGNOSIS — G43909 Migraine, unspecified, not intractable, without status migrainosus: Secondary | ICD-10-CM | POA: Diagnosis not present

## 2016-05-17 DIAGNOSIS — M549 Dorsalgia, unspecified: Secondary | ICD-10-CM | POA: Diagnosis present

## 2016-05-17 DIAGNOSIS — M1712 Unilateral primary osteoarthritis, left knee: Secondary | ICD-10-CM | POA: Insufficient documentation

## 2016-05-17 DIAGNOSIS — Z90710 Acquired absence of both cervix and uterus: Secondary | ICD-10-CM | POA: Insufficient documentation

## 2016-05-17 DIAGNOSIS — I10 Essential (primary) hypertension: Secondary | ICD-10-CM | POA: Insufficient documentation

## 2016-05-17 DIAGNOSIS — M533 Sacrococcygeal disorders, not elsewhere classified: Secondary | ICD-10-CM | POA: Insufficient documentation

## 2016-05-17 DIAGNOSIS — R102 Pelvic and perineal pain: Secondary | ICD-10-CM | POA: Diagnosis present

## 2016-05-17 DIAGNOSIS — D649 Anemia, unspecified: Secondary | ICD-10-CM | POA: Insufficient documentation

## 2016-05-17 DIAGNOSIS — Z8249 Family history of ischemic heart disease and other diseases of the circulatory system: Secondary | ICD-10-CM | POA: Insufficient documentation

## 2016-05-17 DIAGNOSIS — Z90721 Acquired absence of ovaries, unilateral: Secondary | ICD-10-CM | POA: Insufficient documentation

## 2016-05-17 LAB — URINALYSIS, ROUTINE W REFLEX MICROSCOPIC
Bilirubin Urine: NEGATIVE
Glucose, UA: NEGATIVE mg/dL
Ketones, ur: NEGATIVE mg/dL
Leukocytes, UA: NEGATIVE
Nitrite: NEGATIVE
Protein, ur: NEGATIVE mg/dL
Specific Gravity, Urine: <1.005 — ABNORMAL LOW (ref 1.005–1.030)
pH: 6 (ref 5.0–8.0)

## 2016-05-17 LAB — URINALYSIS, MICROSCOPIC (REFLEX)
RBC / HPF: NONE SEEN RBC/hpf (ref 0–5)
Squamous Epithelial / LPF: NONE SEEN

## 2016-05-17 MED ORDER — DICYCLOMINE HCL 10 MG PO CAPS: 10.0000 mg | ORAL_CAPSULE | Freq: Three times a day (TID) | ORAL | 1 refills | Status: DC

## 2016-05-17 NOTE — MAU Provider Note (Signed)
History     CSN: [id number]  Arrival date and time: 05/17/16 6808   First Provider Initiated Contact with Patient 05/17/16 1859      Chief Complaint  Patient presents with  . Abdominal Pain  . Back Pain   Patient is a 51 year old G4 P3 status post total hysterectomy who presents to [facility] for right lower quadrant pain radiating into her back. She has had this pain over the last several months with the last episode being about a month ago. That time she was seen in urgent care and had an outpatient CT scan ordered. That was performed about 1 month ago and was normal. She reports this pain is similar to the pain she had then may be a little bit worse. She denies any urinary frequency or urgency but does have some suprapubic abdominal pain.    OB History    Gravida Para Term Preterm AB Living   4 3 1 2 1 3    SAB TAB Ectopic Multiple Live Births     1            Past Medical History:  Diagnosis Date  . Allergy   . Anemia   . Arthritis    left knee  . Headache(784.0)    Chronic migraines--Dr Lewitt  . Hypertension   . Meckel's diverticulum 08/2004   with ischemia  . Myoma 11/2005   right ovary  . Sacroiliac dysfunction 06/23/2012  . Uterine adhesions     Past Surgical History:  Procedure Laterality Date  . ABDOMINAL HYSTERECTOMY  11/2005  . bowel reconstruction    . LAPAROSCOPIC SALPINGOOPHERECTOMY  11/2005   right-- assisted with daVinci robot, and lysis of omental adhesions.  Marland Kitchen LAPAROSCOPY  11/2005   total laparoscopy  . OTHER SURGICAL HISTORY     Meckel's diverticulectomy, small bowel resection and resection of retrocecal appendix.    Family History  Problem Relation Age of Onset  . Hypertension Other   . Cancer Neg Hx   . Colon cancer Neg Hx   . Esophageal cancer Neg Hx   . Rectal cancer Neg Hx   . Stomach cancer Neg Hx   . Pancreatic cancer Neg Hx     Social History  Substance Use Topics  . Smoking status: Never Smoker  . Smokeless tobacco: Never Used   . Alcohol use Yes     Comment: social    Allergies: No Known Allergies  Facility-Administered Medications Prior to Admission  Medication Dose Route Frequency Provider Last Rate Last Dose  . 0.9 %  sodium chloride infusion  500 mL Intravenous Continuous Doran Stabler, MD       Prescriptions Prior to Admission  Medication Sig Dispense Refill Last Dose  . azithromycin (ZITHROMAX) 250 MG tablet 2 tabs by mouth today, then one tablet by mouth for 4 more days. 6 tablet 0   . benzonatate (TESSALON) 200 MG capsule Take 1 capsule (200 mg total) by mouth 2 (two) times daily as needed for cough. 60 capsule 0 Taking  . eletriptan (RELPAX) 40 MG tablet Take 1 tablet (40 mg total) by mouth as needed. may repeat in 2 hours if necessary (Patient taking differently: Take 40 mg by mouth as needed for migraine. may repeat in 2 hours if necessary) 10 tablet 5 Taking  . hydrochlorothiazide (HYDRODIURIL) 25 MG tablet Take 1 tablet (25 mg total) by mouth daily. 90 tablet 1 Taking  . lisinopril (PRINIVIL,ZESTRIL) 20 MG tablet Take 1 tablet (20 mg  total) by mouth daily. 90 tablet 1 Taking  . nebivolol (BYSTOLIC) 5 MG tablet Take 1 tablet (5 mg total) by mouth daily. 90 tablet 1 Taking  . potassium chloride SA (K-DUR,KLOR-CON) 20 MEQ tablet Take 1 tablet (20 mEq total) by mouth daily. 90 tablet 1 Taking    Review of Systems  Constitutional: Negative for chills and fever.  HENT: Negative for congestion and rhinorrhea.   Respiratory: Negative for cough and shortness of breath.   Cardiovascular: Negative for chest pain and palpitations.  Gastrointestinal: Positive for abdominal pain. Negative for abdominal distention, constipation, diarrhea, nausea and vomiting.  Genitourinary: Negative for difficulty urinating, dysuria, frequency and hematuria.  Neurological: Negative for dizziness and weakness.   Physical Exam   Blood pressure (!) 143/87, pulse 64, temperature 98.2 F (36.8 C), temperature source Oral,  resp. rate 18, weight 166 lb 1.3 oz (75.3 kg), last menstrual period 11/10/2005, SpO2 100 %.  Physical Exam  Constitutional: She is oriented to person, place, and time. She appears well-developed and well-nourished.  HENT:  Head: Normocephalic and atraumatic.  Eyes: Conjunctivae are normal. Pupils are equal, round, and reactive to light.  Cardiovascular: Normal rate, regular rhythm, normal heart sounds and intact distal pulses.   Respiratory: Effort normal. No respiratory distress. She has no wheezes. She has no rales.  GI: Soft. She exhibits no distension. There is no rebound and no guarding.  Moderate tenderness suprapubic in the bilateral lower quadrants  Neurological: She is alert and oriented to person, place, and time.  Skin: Skin is warm and dry.  Psychiatric: She has a normal mood and affect.    MAU Course  Procedures  MDM In MA U patient underwent evaluation at physical exam and urinalysis. Patient's physical exam was consistent with UTI but symptoms were not. Patient's urinalysis was unremarkable aside from some trace hematuria. Patient denied any vaginal bleeding or discharge. Given recent normal CT scan no further workup warranted at this time.  -Will give dicyclomine to see if patient's pain is relieved and could be caused by bowel spasm.  Assessment and Plan  #1: Abdominal pain of unknown etiology will prescribe cyclical mean and see if patient's symptoms improved. Patient can be discharged home.  Jacquiline Doe 05/17/2016, 7:28 PM

## 2016-05-17 NOTE — MAU Note (Addendum)
Patient c/o lower right abdominal pain that radiates around to lower. Endorses having mri on 4/17 done with negative results. Has had hysterectomy. Pain has been going on since march.

## 2016-05-17 NOTE — Discharge Instructions (Signed)

## 2016-08-10 ENCOUNTER — Ambulatory Visit: Payer: Managed Care, Other (non HMO) | Admitting: Family

## 2016-08-17 ENCOUNTER — Ambulatory Visit (INDEPENDENT_AMBULATORY_CARE_PROVIDER_SITE_OTHER): Payer: Managed Care, Other (non HMO) | Admitting: Family

## 2016-08-17 VITALS — BP 132/65 | HR 60 | Temp 98.5°F | Ht 63.0 in | Wt 170.2 lb

## 2016-08-17 DIAGNOSIS — G43909 Migraine, unspecified, not intractable, without status migrainosus: Secondary | ICD-10-CM | POA: Diagnosis not present

## 2016-08-17 DIAGNOSIS — K219 Gastro-esophageal reflux disease without esophagitis: Secondary | ICD-10-CM | POA: Diagnosis not present

## 2016-08-17 DIAGNOSIS — I10 Essential (primary) hypertension: Secondary | ICD-10-CM | POA: Diagnosis not present

## 2016-08-17 LAB — BASIC METABOLIC PANEL
BUN: 23 mg/dL (ref 6–23)
CO2: 30 mEq/L (ref 19–32)
Calcium: 9.4 mg/dL (ref 8.4–10.5)
Chloride: 104 mEq/L (ref 96–112)
Creatinine, Ser: 0.86 mg/dL (ref 0.40–1.20)
GFR: 89.2 mL/min (ref >60.00)
Glucose, Bld: 80 mg/dL (ref 70–99)
Potassium: 3.6 mEq/L (ref 3.5–5.1)
Sodium: 137 mEq/L (ref 135–145)

## 2016-08-17 NOTE — Progress Notes (Signed)
Pre visit review using our clinic tool,if applicable. No additional management support is needed unless otherwise documented below in the visit note.  

## 2016-08-17 NOTE — Progress Notes (Signed)
Subjective:    Patient ID: [name], female    DOB: Jun 22, 1965, 51 y.o.   MRN: [id number]  HPI   [name] is a 51 yr old female who presents today for follow up.  1) HTN- She is maintained on by systolic 5 mg once daily, lisinopril 20 mg once daily, and hydrochlorothiazide 25 mg once daily. BP Readings from Last 3 Encounters:  05/17/16 137/80  02/10/16 128/65  12/01/15 128/76   2) migraines- She continues Relpax on an as-needed basis. She is followed by Dr. Melton Alar.  Reports rare migraines.   3) Gerd last visit we discontinued Zantac. Reports symptoms well controlled with dietary changes.    Review of Systems  Respiratory: Negative for shortness of breath.   Cardiovascular: Negative for chest pain and leg swelling.   See HPI  Past Medical History:  Diagnosis Date  . Allergy   . Anemia   . Arthritis    left knee  . Headache(784.0)    Chronic migraines--Dr Lewitt  . Hypertension   . Meckel's diverticulum 08/2004   with ischemia  . Myoma 11/2005   right ovary  . Sacroiliac dysfunction 06/23/2012  . Uterine adhesions      Social History   Social History  . Marital status: Single    Spouse name: N/A  . Number of children: 3  . Years of education: N/A   Occupational History  . Cashier    Social History Main Topics  . Smoking status: Never Smoker  . Smokeless tobacco: Never Used  . Alcohol use Yes     Comment: social  . Drug use: No  . Sexual activity: Yes   Other Topics Concern  . Not on file   Social History Narrative   Lives with daughter    Daughter- lives in [place]- lives locally   Works at [employer]   Completed HS   No pets   Enjoys walking, mo   vies, Psychologist, clinical, bowling    Past Surgical History:  Procedure Laterality Date  . ABDOMINAL HYSTERECTOMY  11/2005  . bowel reconstruction    . LAPAROSCOPIC SALPINGOOPHERECTOMY  11/2005   right-- assisted with daVinci robot, and lysis of omental adhesions.  Marland Kitchen LAPAROSCOPY  11/2005   total  laparoscopy  . OTHER SURGICAL HISTORY     Meckel's diverticulectomy, small bowel resection and resection of retrocecal appendix.    Family History  Problem Relation Age of Onset  . Hypertension Other   . Cancer Neg Hx   . Colon cancer Neg Hx   . Esophageal cancer Neg Hx   . Rectal cancer Neg Hx   . Stomach cancer Neg Hx   . Pancreatic cancer Neg Hx     No Known Allergies  Current Outpatient Prescriptions on File Prior to Visit  Medication Sig Dispense Refill  . benzonatate (TESSALON) 200 MG capsule Take 1 capsule (200 mg total) by mouth 2 (two) times daily as needed for cough. 60 capsule 0  . eletriptan (RELPAX) 40 MG tablet Take 1 tablet (40 mg total) by mouth as needed. may repeat in 2 hours if necessary (Patient taking differently: Take 40 mg by mouth as needed for migraine. may repeat in 2 hours if necessary) 10 tablet 5  . hydrochlorothiazide (HYDRODIURIL) 25 MG tablet Take 1 tablet (25 mg total) by mouth daily. 90 tablet 1  . lisinopril (PRINIVIL,ZESTRIL) 20 MG tablet Take 1 tablet (20 mg total) by mouth daily. 90 tablet 1  . nebivolol (  BYSTOLIC) 5 MG tablet Take 1 tablet (5 mg total) by mouth daily. 90 tablet 1  . potassium chloride SA (K-DUR,KLOR-CON) 20 MEQ tablet Take 1 tablet (20 mEq total) by mouth daily. 90 tablet 1   Current Facility-Administered Medications on File Prior to Visit  Medication Dose Route Frequency Provider Last Rate Last Dose  . 0.9 %  sodium chloride infusion  500 mL Intravenous Continuous Danis, Estill Cotta III, MD        Pulse 60   Temp 98.5 F (36.9 C) (Oral)   Ht 5\' 3"  (1.6 m)   Wt 170 lb 3.2 oz (77.2 kg)   LMP 11/10/2005   SpO2 100%   BMI 30.15 kg/m       Objective:   Physical Exam  Constitutional: She is oriented to person, place, and time. She appears well-developed and well-nourished.  HENT:  Head: Normocephalic and atraumatic.  Cardiovascular: Normal rate, regular rhythm and normal heart sounds.   No murmur heard. Pulmonary/Chest:  Effort normal and breath sounds normal. No respiratory distress. She has no wheezes.  Musculoskeletal: She exhibits no edema.  Neurological: She is alert and oriented to person, place, and time.  Psychiatric: She has a normal mood and affect. Her behavior is normal. Judgment and thought content normal.          Assessment & Plan:  HTN- BP stable on current meds. Continue same, obtain follow up bmet.    Migraines-stable she is currently following with Dr. Bobby Rumpf.  GERD-stable off of Zantac. Monitor

## 2016-08-19 ENCOUNTER — Encounter: Payer: Self-pay | Admitting: Family

## 2016-10-17 ENCOUNTER — Other Ambulatory Visit: Payer: Self-pay | Admitting: Family

## 2016-11-16 ENCOUNTER — Encounter: Payer: Managed Care, Other (non HMO) | Admitting: Family

## 2016-12-05 ENCOUNTER — Encounter: Payer: Managed Care, Other (non HMO) | Admitting: Family

## 2017-05-31 ENCOUNTER — Encounter: Payer: Managed Care, Other (non HMO) | Admitting: Family

## 2017-07-12 ENCOUNTER — Encounter: Payer: Self-pay | Admitting: Family

## 2017-07-12 ENCOUNTER — Ambulatory Visit (HOSPITAL_BASED_OUTPATIENT_CLINIC_OR_DEPARTMENT_OTHER)
Admission: RE | Admit: 2017-07-12 | Discharge: 2017-07-12 | Disposition: A | Discharge status: Home / Self Care | Payer: Managed Care, Other (non HMO) | Source: Ambulatory Visit | Attending: Family | Admitting: Family

## 2017-07-12 ENCOUNTER — Encounter: Payer: Self-pay | Admitting: Neurology

## 2017-07-12 ENCOUNTER — Ambulatory Visit (INDEPENDENT_AMBULATORY_CARE_PROVIDER_SITE_OTHER): Payer: Managed Care, Other (non HMO) | Admitting: Family

## 2017-07-12 ENCOUNTER — Other Ambulatory Visit: Payer: Self-pay

## 2017-07-12 VITALS — BP 146/100 | HR 62 | Temp 98.1°F | Resp 16 | Ht 63.0 in | Wt 173.0 lb

## 2017-07-12 DIAGNOSIS — I10 Essential (primary) hypertension: Secondary | ICD-10-CM | POA: Diagnosis not present

## 2017-07-12 DIAGNOSIS — Z23 Encounter for immunization: Secondary | ICD-10-CM

## 2017-07-12 DIAGNOSIS — Z0000 Encounter for general adult medical examination without abnormal findings: Secondary | ICD-10-CM

## 2017-07-12 DIAGNOSIS — G43909 Migraine, unspecified, not intractable, without status migrainosus: Secondary | ICD-10-CM

## 2017-07-12 LAB — LIPID PANEL
Cholesterol: 201 mg/dL — ABNORMAL HIGH (ref 0–200)
HDL: 44.3 mg/dL (ref >39.00)
LDL Cholesterol: 142 mg/dL — ABNORMAL HIGH (ref 0–99)
NonHDL: 156.34
Total CHOL/HDL Ratio: 5
Triglycerides: 72 mg/dL (ref 0.0–149.0)
VLDL: 14.4 mg/dL (ref 0.0–40.0)

## 2017-07-12 LAB — CBC WITH DIFFERENTIAL/PLATELET
Basophils Absolute: 0 10*3/uL (ref 0.0–0.1)
Basophils Relative: 0.9 % (ref 0.0–3.0)
Eosinophils Absolute: 0.1 10*3/uL (ref 0.0–0.7)
Eosinophils Relative: 2.4 % (ref 0.0–5.0)
HCT: 39.4 % (ref 36.0–46.0)
Hemoglobin: 13.6 g/dL (ref 12.0–15.0)
Lymphocytes Relative: 21.4 % (ref 12.0–46.0)
Lymphs Abs: 0.8 10*3/uL (ref 0.7–4.0)
MCHC: 34.5 g/dL (ref 30.0–36.0)
MCV: 86.5 fl (ref 78.0–100.0)
Monocytes Absolute: 0.4 10*3/uL (ref 0.1–1.0)
Monocytes Relative: 11.8 % (ref 3.0–12.0)
Neutro Abs: 2.2 10*3/uL (ref 1.4–7.7)
Neutrophils Relative %: 63.5 % (ref 43.0–77.0)
Platelets: 257 10*3/uL (ref 150.0–400.0)
RBC: 4.56 Mil/uL (ref 3.87–5.11)
RDW: 13.4 % (ref 11.5–15.5)
WBC: 3.5 10*3/uL — ABNORMAL LOW (ref 4.0–10.5)

## 2017-07-12 LAB — BASIC METABOLIC PANEL
BUN: 14 mg/dL (ref 6–23)
CO2: 28 mEq/L (ref 19–32)
Calcium: 9.4 mg/dL (ref 8.4–10.5)
Chloride: 104 mEq/L (ref 96–112)
Creatinine, Ser: 0.76 mg/dL (ref 0.40–1.20)
GFR: 102.51 mL/min (ref >60.00)
Glucose, Bld: 88 mg/dL (ref 70–99)
Potassium: 4 mEq/L (ref 3.5–5.1)
Sodium: 139 mEq/L (ref 135–145)

## 2017-07-12 LAB — URINALYSIS, ROUTINE W REFLEX MICROSCOPIC
Bilirubin Urine: NEGATIVE
Hgb urine dipstick: NEGATIVE
Ketones, ur: NEGATIVE
Leukocytes, UA: NEGATIVE
Nitrite: NEGATIVE
RBC / HPF: NONE SEEN (ref 0–?)
Specific Gravity, Urine: 1.015 (ref 1.000–1.030)
Total Protein, Urine: NEGATIVE
Urine Glucose: NEGATIVE
Urobilinogen, UA: 0.2 (ref 0.0–1.0)
pH: 7 (ref 5.0–8.0)

## 2017-07-12 LAB — HEPATIC FUNCTION PANEL
ALT: 8 U/L (ref 0–35)
AST: 12 U/L (ref 0–37)
Albumin: 4.1 g/dL (ref 3.5–5.2)
Alkaline Phosphatase: 84 U/L (ref 39–117)
Bilirubin, Direct: 0.1 mg/dL (ref 0.0–0.3)
Total Bilirubin: 0.8 mg/dL (ref 0.2–1.2)
Total Protein: 6.7 g/dL (ref 6.0–8.3)

## 2017-07-12 LAB — TSH: TSH: 0.86 u[IU]/mL (ref 0.35–4.50)

## 2017-07-12 MED ORDER — LISINOPRIL 20 MG PO TABS: ORAL_TABLET | ORAL | 1 refills | Status: DC

## 2017-07-12 MED ORDER — NEBIVOLOL HCL 5 MG PO TABS: 5.0000 mg | ORAL_TABLET | Freq: Every day | ORAL | 1 refills | Status: DC

## 2017-07-12 MED ORDER — HYDROCHLOROTHIAZIDE 25 MG PO TABS: ORAL_TABLET | ORAL | 1 refills | Status: DC

## 2017-07-12 NOTE — Progress Notes (Addendum)
Subjective:    Patient ID: [name], female    DOB: 1965-04-26, 52 y.o.   MRN: [id number]  HPI  Patient presents today for complete physical.  Immunizations: tdap today Diet: healthy Exercise: walking regularly Colonoscopy: 8/17 Mammogram: 2017- due Dental: up to date Vision: up to date  HTN- out of bystolic- continues hctz and lisinopril.  BP Readings from Last 3 Encounters:  07/12/17 (!) 146/100  08/17/16 132/65  05/17/16 137/80   Migraines- has more migraines in the heat recently  Was receiving from neurology. Reports 3 migraines in the last 1 month.  Uses relpax prn.  Has seen neurology but Dr. Melton Alar but he retired.  Wt Readings from Last 3 Encounters:  07/12/17 173 lb (78.5 kg)  08/17/16 170 lb 3.2 oz (77.2 kg)  05/17/16 166 lb 1.3 oz (75.3 kg)     Review of Systems  Constitutional: Negative for unexpected weight change.  HENT: Negative for rhinorrhea.   Eyes: Negative for visual disturbance.  Respiratory: Negative for cough and shortness of breath.   Cardiovascular: Negative for chest pain.  Gastrointestinal: Negative for diarrhea, nausea and vomiting.  Genitourinary: Negative for dysuria and frequency.  Musculoskeletal: Negative for arthralgias and myalgias.  Skin: Negative for rash.  Neurological: Negative for headaches.  Hematological: Negative for adenopathy.  Psychiatric/Behavioral:       Denies depression/anxiety   Past Medical History:  Diagnosis Date  . Allergy   . Anemia   . Arthritis    left knee  . Headache(784.0)    Chronic migraines--Dr Lewitt  . Hypertension   . Meckel's diverticulum 08/2004   with ischemia  . Myoma 11/2005   right ovary  . Sacroiliac dysfunction 06/23/2012  . Uterine adhesions      Social History   Socioeconomic History  . Marital status: Single    Spouse name: Not on file  . Number of children: 3  . Years of education: Not on file  . Highest education level: Not on file  Occupational History  .  Occupation: Investment banker, operational  . Financial resource strain: Not on file  . Food insecurity:    Worry: Not on file    Inability: Not on file  . Transportation needs:    Medical: Not on file    Non-medical: Not on file  Tobacco Use  . Smoking status: Never Smoker  . Smokeless tobacco: Never Used  Substance and Sexual Activity  . Alcohol use: Yes    Comment: social  . Drug use: No  . Sexual activity: Yes  Lifestyle  . Physical activity:    Days per week: Not on file    Minutes per session: Not on file  . Stress: Not on file  Relationships  . Social connections:    Talks on phone: Not on file    Gets together: Not on file    Attends religious service: Not on file    Active member of club or organization: Not on file    Attends meetings of clubs or organizations: Not on file    Relationship status: Not on file  . Intimate partner violence:    Fear of current or ex partner: Not on file    Emotionally abused: Not on file    Physically abused: Not on file    Forced sexual activity: Not on file  Other Topics Concern  . Not on file  Social History Narrative   Lives with daughter    Daughter- lives in [place]  Son- lives locally   Works at [employer]   Completed HS   No pets   Enjoys walking, mo   vies, Psychologist, clinical, bowling    Past Surgical History:  Procedure Laterality Date  . ABDOMINAL HYSTERECTOMY  11/2005  . bowel reconstruction    . LAPAROSCOPIC SALPINGOOPHERECTOMY  11/2005   right-- assisted with daVinci robot, and lysis of omental adhesions.  Marland Kitchen LAPAROSCOPY  11/2005   total laparoscopy  . OTHER SURGICAL HISTORY     Meckel's diverticulectomy, small bowel resection and resection of retrocecal appendix.    Family History  Problem Relation Age of Onset  . Hypertension Other   . Cancer Neg Hx   . Colon cancer Neg Hx   . Esophageal cancer Neg Hx   . Rectal cancer Neg Hx   . Stomach cancer Neg Hx   . Pancreatic cancer Neg Hx     No Known Allergies  Current  Outpatient Medications on File Prior to Visit  Medication Sig Dispense Refill  . eletriptan (RELPAX) 40 MG tablet Take 1 tablet (40 mg total) by mouth as needed. may repeat in 2 hours if necessary (Patient taking differently: Take 40 mg by mouth as needed for migraine. may repeat in 2 hours if necessary) 10 tablet 5   No current facility-administered medications on file prior to visit.     BP (!) 146/100 (BP Location: Right Arm, Patient Position: Sitting, Cuff Size: Large)   Pulse 62   Temp 98.1 F (36.7 C) (Oral)   Resp 16   Ht 5\' 3"  (1.6 m)   Wt 173 lb (78.5 kg)   LMP 11/10/2005   SpO2 98%   BMI 30.65 kg/m       Objective:   Physical Exam  Physical Exam  Constitutional: She is oriented to person, place, and time. She appears well-developed and well-nourished. No distress.  HENT:  Head: Normocephalic and atraumatic.  Right Ear: Tympanic membrane and ear canal normal.  Left Ear: Tympanic membrane and ear canal normal.  Mouth/Throat: Oropharynx is clear and moist.  Eyes: Pupils are equal, round, and reactive to light. No scleral icterus.  Neck: Normal range of motion. No thyromegaly present.  Cardiovascular: Normal rate and regular rhythm.   No murmur heard. Pulmonary/Chest: Effort normal and breath sounds normal. No respiratory distress. He has no wheezes. She has no rales. She exhibits no tenderness.  Abdominal: Soft. Bowel sounds are normal. She exhibits no distension and no mass. There is no tenderness. There is no rebound and no guarding.  Musculoskeletal: She exhibits no edema.  Lymphadenopathy:    She has no cervical adenopathy.  Neurological: She is alert and oriented to person, place, and time. She has normal patellar reflexes. She exhibits normal muscle tone. Coordination normal.  Skin: Skin is warm and dry.  Psychiatric: She has a normal mood and affect. Her behavior is normal. Judgment and thought content normal.  Breast/pelvic: deferred       Assessment &  Plan:   Preventative care- discussed healthy diet, exercise, weight loss. Refer for mammo. Immunizations reviewed- Tdap today. Obtain routine lab work.  HTN- uncontrolled, resume bystolic, continue hctz and lisinopril.  Migraines- fair control. Needs new neurologist, will refer to Neuro.      Assessment & Plan:

## 2017-07-12 NOTE — Patient Instructions (Addendum)
Restart bystolic. You should be contacted about scheduling your neurology appointment.  Continue heathy diet, exercise.

## 2017-07-12 NOTE — Progress Notes (Signed)
Wants new neurology referral for HAs. Wants to address intermittent R pelvic pain X several months, and L arm muscular pain X one month.

## 2017-07-12 NOTE — Addendum Note (Signed)
Addended by: Wynonia Musty A on: 07/12/2017 08:45 AM   Modules accepted: Orders

## 2017-07-14 ENCOUNTER — Other Ambulatory Visit: Payer: Self-pay | Admitting: Family

## 2017-07-14 DIAGNOSIS — R928 Other abnormal and inconclusive findings on diagnostic imaging of breast: Secondary | ICD-10-CM

## 2017-07-17 ENCOUNTER — Telehealth: Payer: Self-pay | Admitting: *Deleted

## 2017-07-17 NOTE — Telephone Encounter (Signed)
Received Physician Orders from [place]; forwarded to provider/SLS 07/08

## 2017-07-19 ENCOUNTER — Ambulatory Visit
Admission: RE | Admit: 2017-07-19 | Discharge: 2017-07-19 | Disposition: A | Discharge status: Home / Self Care | Payer: Managed Care, Other (non HMO) | Source: Ambulatory Visit | Attending: Family | Admitting: Family

## 2017-07-19 ENCOUNTER — Other Ambulatory Visit: Payer: Self-pay | Admitting: Family

## 2017-07-19 DIAGNOSIS — R921 Mammographic calcification found on diagnostic imaging of breast: Secondary | ICD-10-CM

## 2017-07-19 DIAGNOSIS — R928 Other abnormal and inconclusive findings on diagnostic imaging of breast: Secondary | ICD-10-CM

## 2017-07-27 ENCOUNTER — Encounter: Payer: Self-pay | Admitting: Neurology

## 2017-07-27 ENCOUNTER — Ambulatory Visit: Payer: Managed Care, Other (non HMO) | Admitting: Neurology

## 2017-07-27 VITALS — BP 120/76 | HR 77 | Ht 63.0 in | Wt 174.0 lb

## 2017-07-27 DIAGNOSIS — G43009 Migraine without aura, not intractable, without status migrainosus: Secondary | ICD-10-CM

## 2017-07-27 MED ORDER — FLURBIPROFEN 100 MG PO TABS: 100.0000 mg | ORAL_TABLET | ORAL | 5 refills | Status: DC

## 2017-07-27 MED ORDER — ZOLMITRIPTAN 5 MG NA SOLN: 5.0000 mg | NASAL | 0 refills | Status: DC | PRN

## 2017-07-27 NOTE — Progress Notes (Signed)
NEUROLOGY CONSULTATION NOTE  [name] MRN: [id number] DOB: 09/22/1965  Referring provider: Debbrah Alar, NP Primary care provider: Debbrah Alar, NP  Reason for consult:  migraine  HISTORY OF PRESENT ILLNESS: [name] is a 52 year old right-handed female with  hypertension who presents for headaches.  History supplemented by referring provider's note.  Onset:  Mid 20s Location:  Unilateral either side (retro-orbital to back of head, right greater than left) and into neck Quality:  throbbing Intensity:  Mild-moderate and severe.  She denies new headache, thunderclap headache or severe headache that wakes from sleep. Aura:  no Prodrome:  no Postdrome:  no Associated symptoms:  Conjunctival injection, dizziness, nausea, photophobia, phonophobia, blurred vision.  She denies associated unilateral numbness or weakness. Duration:  Mild-moderate:  1/2 day; Severe: 3 days. Often wakes up with migraine. Frequency:  Mild-moderate:  2 days a month; Severe: 1 every 3 months Frequency of abortive medication: 2 days a month Triggers/exacerbating factors:  Hot dogs Relieving factors:  Heating pad, cold pack or hot shower on neck Activity:  Severe aggravates  Rescue protocol:  Mild: 1st line flurbiprofen or naproxen, 2nd line Relpax; Severe: Relpax Current NSAIDS:  Flurbiprofen, naproxen Current analgesics:  no Current triptans:  Relpax 40mg  Current anti-emetic:  no Current muscle relaxants:  no Current anti-anxiolytic:  no Current sleep aide:  no Current Antihypertensive medications:  lisinopril, Bystolic, HCTZ Current Antidepressant medications:  no Current Anticonvulsant medications:  no Current Vitamins/Herbal/Supplements:  no Current Antihistamines/Decongestants:  no Other therapy:  no  Past NSAIDS:  ibuprofen Past analgesics:  Excedrin Migraine, Tylenol Past abortive triptans:  Sumatriptan tablet Past muscle relaxants:  Flexeril, Robaxin Past anti-emetic:   no Past antihypertensive medications:  no Past antidepressant medications:  no Past anticonvulsant medications:  topiramate (not sure why taken off) Past vitamins/Herbal/Supplements:  no Past antihistamines/decongestants:  no Other past therapies:  no  Caffeine:  Rarely coffee or sweet tea Alcohol:  no Smoker:  no Diet:  Hydrates.  No soda Exercise:  Walks daily Depression:  no; Anxiety:  no Other pain:  no Sleep hygiene:  good Family history of headache:  no  07/12/17 LABS:  CBC with WBC 3.5, HGB 13.6, HCT 39.4, PLT 257; BMP with Na 139, K 4, Cl 104, CO2 28, glucose 88, BUN 14, Cr 0.76; Hepatic panel with t bili 0.8, ALP 84, AST 12, ALT 8  PAST MEDICAL HISTORY: Past Medical History:  Diagnosis Date  . Allergy   . Anemia   . Arthritis    left knee  . Headache(784.0)    Chronic migraines--Dr Lewitt  . Hypertension   . Meckel's diverticulum 08/2004   with ischemia  . Myoma 11/2005   right ovary  . Sacroiliac dysfunction 06/23/2012  . Uterine adhesions     PAST SURGICAL HISTORY: Past Surgical History:  Procedure Laterality Date  . ABDOMINAL HYSTERECTOMY  11/2005  . bowel reconstruction    . LAPAROSCOPIC SALPINGOOPHERECTOMY  11/2005   right-- assisted with daVinci robot, and lysis of omental adhesions.  Marland Kitchen LAPAROSCOPY  11/2005   total laparoscopy  . OTHER SURGICAL HISTORY     Meckel's diverticulectomy, small bowel resection and resection of retrocecal appendix.    MEDICATIONS: Current Outpatient Medications on File Prior to Visit  Medication Sig Dispense Refill  . flurbiprofen (ANSAID) 100 MG tablet Take 100 mg by mouth as directed.    . naproxen sodium (ALEVE) 220 MG tablet Take 220 mg by mouth as needed.    . hydrochlorothiazide (  HYDRODIURIL) 25 MG tablet TAKE 1 TABLET (25 MG TOTAL) BY MOUTH DAILY. 90 tablet 1  . lisinopril (PRINIVIL,ZESTRIL) 20 MG tablet TAKE 1 TABLET (20 MG TOTAL) BY MOUTH DAILY. 90 tablet 1  . nebivolol (BYSTOLIC) 5 MG tablet Take 1 tablet (5 mg  total) by mouth daily. 90 tablet 1   No current facility-administered medications on file prior to visit.     ALLERGIES: No Known Allergies  FAMILY HISTORY: Family History  Problem Relation Age of Onset  . Hypertension Other   . Hypertension Mother   . Hypertension Father   . Cancer Neg Hx   . Colon cancer Neg Hx   . Esophageal cancer Neg Hx   . Rectal cancer Neg Hx   . Stomach cancer Neg Hx   . Pancreatic cancer Neg Hx     SOCIAL HISTORY: Social History   Socioeconomic History  . Marital status: Single    Spouse name: Not on file  . Number of children: 3  . Years of education: Not on file  . Highest education level: 12th grade  Occupational History  . Occupation: Surveyor, quantity: [employer]  . Financial resource strain: Not on file  . Food insecurity:    Worry: Not on file    Inability: Not on file  . Transportation needs:    Medical: Not on file    Non-medical: Not on file  Tobacco Use  . Smoking status: Never Smoker  . Smokeless tobacco: Never Used  Substance and Sexual Activity  . Alcohol use: Yes    Comment: social  . Drug use: No  . Sexual activity: Yes  Lifestyle  . Physical activity:    Days per week: Not on file    Minutes per session: Not on file  . Stress: Not on file  Relationships  . Social connections:    Talks on phone: Not on file    Gets together: Not on file    Attends religious service: Not on file    Active member of club or organization: Not on file    Attends meetings of clubs or organizations: Not on file    Relationship status: Not on file  . Intimate partner violence:    Fear of current or ex partner: Not on file    Emotionally abused: Not on file    Physically abused: Not on file    Forced sexual activity: Not on file  Other Topics Concern  . Not on file  Social History Narrative   Lives with daughter (grand-daughter)   Daughter- lives in [place]- lives locally   Works at [employer]   Completed HS    No pets   Enjoys walking, mo   vies, Psychologist, clinical, bowling      Patient is right-handed. Her daughter and granddaughter live with her in a 2 story house. She drinks one cup of coffee a day in the winter months. She drinks tea occasionally. She walks daily, weather permitting.    REVIEW OF SYSTEMS: Constitutional: No fevers, chills, or sweats, no generalized fatigue, change in appetite Eyes: No visual changes, double vision, eye pain Ear, nose and throat: No hearing loss, ear pain, nasal congestion, sore throat Cardiovascular: No chest pain, palpitations Respiratory:  No shortness of breath at rest or with exertion, wheezes GastrointestinaI: No nausea, vomiting, diarrhea, abdominal pain, fecal incontinence Genitourinary:  No dysuria, urinary retention or frequency Musculoskeletal:  No neck pain, back pain Integumentary: No rash, pruritus,  skin lesions Neurological: as above Psychiatric: No depression, insomnia, anxiety Endocrine: No palpitations, fatigue, diaphoresis, mood swings, change in appetite, change in weight, increased thirst Hematologic/Lymphatic:  No purpura, petechiae. Allergic/Immunologic: no itchy/runny eyes, nasal congestion, recent allergic reactions, rashes  PHYSICAL EXAM: Vitals:   07/27/17 1353  BP: 120/76  Pulse: 77  SpO2: 97%   General: No acute distress.  Patient appears well-groomed.  Head:  Normocephalic/atraumatic Eyes:  fundi examined but not visualized Neck: supple, no paraspinal tenderness, full range of motion Back: No paraspinal tenderness Heart: regular rate and rhythm Lungs: Clear to auscultation bilaterally. Vascular: No carotid bruits. Neurological Exam: Mental status: alert and oriented to person, place, and time, recent and remote memory intact, fund of knowledge intact, attention and concentration intact, speech fluent and not dysarthric, language intact. Cranial nerves: CN I: not tested CN II: pupils equal, round and reactive to light, visual  fields intact CN III, IV, VI:  full range of motion, no nystagmus, no ptosis CN V: facial sensation intact CN VII: upper and lower face symmetric CN VIII: hearing intact CN IX, X: gag intact, uvula midline CN XI: sternocleidomastoid and trapezius muscles intact CN XII: tongue midline Bulk & Tone: normal, no fasciculations. Motor:  5/5 throughout  Sensation: temperature and vibration sensation intact. Deep Tendon Reflexes:  2+ throughout, toes downgoing.  Finger to nose testing:  Without dysmetria.  Heel to shin:  Without dysmetria.  Gait:  Normal station and stride.  Able to turn and tandem walk. Romberg negative.  IMPRESSION: Migraine without aura, without status migrainosus, not intractable  PLAN: 1.  As infrequent, preventative medication not indicated 2.  For mild migraines, continue current rescue protocol:  Flurbiprofen and Relax 3.  For severe migraines, for abortive therapy will try Zomig 5mg  nasal spray.  Alternative therapy would be Migranal or sumatriptan 6mg  [place].  Second line, consider Cambia or Sprix. 4.  Limit pain relievers to no more than 2 days out of week to prevent rebound headache 5.  Keep headache diary 6.  FMLA will be completed 7.  Follow up in 5 months.  Thank you for allowing me to take part in the care of this patient.  Metta Clines, DO  CC:  Debbrah Alar, NP

## 2017-07-27 NOTE — Patient Instructions (Addendum)
Migraine Recommendations: 1.  For mild migraines, continue taking the flurbiprofen as first line and Relpax as second line. 2.  For severe migriane, take Zomig 5mg  nasal spray at earliest onset of headache.  One spray in one nostril.  May repeat dose once in 2 hours if needed.  Do not exceed two doses in 24 hours. 3.  Limit use of pain relievers to no more than 2 days out of the week.  These medications include acetaminophen, ibuprofen, triptans and narcotics.  This will help reduce risk of rebound headaches. 4.  Be aware of common food triggers such as processed sweets, processed foods with nitrites (such as deli meat, hot dogs, sausages), foods with MSG, alcohol (such as wine), chocolate, certain cheeses, certain fruits (dried fruits, bananas, some citrus fruit), vinegar, diet soda. 4.  Avoid caffeine 5.  Routine exercise 6.  Proper sleep hygiene 7.  Stay adequately hydrated with water 8.  Keep a headache diary. 9.  Maintain proper stress management. 10.  Do not skip meals. 11.  Consider supplements:  Magnesium citrate 400mg  to 600mg  daily, riboflavin 400mg , Coenzyme Q 10 100mg  three times daily 12.  Follow up in 5 months

## 2017-08-17 ENCOUNTER — Telehealth: Payer: Self-pay

## 2017-08-17 NOTE — Telephone Encounter (Signed)
Called Pt to let her know FMLA papers are ready to be p/u at front desk, $25 fee

## 2017-08-18 ENCOUNTER — Telehealth: Payer: Self-pay | Admitting: Neurology

## 2017-08-18 MED ORDER — ZOLMITRIPTAN 5 MG NA SOLN: 5.0000 mg | NASAL | 3 refills | Status: DC | PRN

## 2017-08-18 NOTE — Telephone Encounter (Signed)
Patient called and wanted a prescription sent in for the Nasal Spray Zomig 5mg  to the [facility] on [address]. If you need to call her back its [phone]. Thanks.

## 2017-09-01 DIAGNOSIS — Z0279 Encounter for issue of other medical certificate: Secondary | ICD-10-CM

## 2017-10-18 ENCOUNTER — Encounter: Payer: Self-pay | Admitting: Family

## 2017-10-18 ENCOUNTER — Ambulatory Visit: Payer: Managed Care, Other (non HMO) | Admitting: Family

## 2017-10-18 ENCOUNTER — Ambulatory Visit (HOSPITAL_BASED_OUTPATIENT_CLINIC_OR_DEPARTMENT_OTHER)
Admission: RE | Admit: 2017-10-18 | Discharge: 2017-10-18 | Disposition: A | Discharge status: Home / Self Care | Payer: Managed Care, Other (non HMO) | Source: Ambulatory Visit | Attending: Family | Admitting: Family

## 2017-10-18 VITALS — BP 143/88 | HR 56 | Temp 98.2°F | Resp 16 | Ht 63.0 in | Wt 169.0 lb

## 2017-10-18 DIAGNOSIS — I10 Essential (primary) hypertension: Secondary | ICD-10-CM | POA: Diagnosis not present

## 2017-10-18 DIAGNOSIS — G43909 Migraine, unspecified, not intractable, without status migrainosus: Secondary | ICD-10-CM

## 2017-10-18 DIAGNOSIS — E2839 Other primary ovarian failure: Secondary | ICD-10-CM

## 2017-10-18 DIAGNOSIS — R102 Pelvic and perineal pain: Secondary | ICD-10-CM

## 2017-10-18 MED ORDER — CALCIUM CARBONATE-VITAMIN D 600-400 MG-UNIT PO CHEW: 1.0000 | CHEWABLE_TABLET | Freq: Every day | ORAL | Status: DC

## 2017-10-18 NOTE — Progress Notes (Signed)
Subjective:    Patient ID: [name], female    DOB: 1965-04-10, 52 y.o.   MRN: [id number]  HPI  Patient is a 52 year old female who presents today for routine follow-up.  Hypertension- she is maintained on hydrochlorothiazide 25 mg, lisinopril 20 mg, and I stopped 5 mg.  Last visit she was out of her Bystolic and blood pressure was noted to be elevated. BP Readings from Last 3 Encounters:  10/18/17 (!) 143/88  07/27/17 120/76  07/12/17 (!) 146/100   Migraine- she is now following up with neurology, Dr. Tomi Likens.   Pelvic pain- reports that she had a pelvic US done by urgent care and was told that maybe she had some scar tissue form one of her surgeries. Reports pain is mild.    Had TAH/BSO in 2006.  Did not have HRT.    Review of Systems See HPI  Past Medical History:  Diagnosis Date  . Allergy   . Anemia   . Arthritis    left knee  . Headache(784.0)    Chronic migraines--Dr Lewitt  . Hypertension   . Meckel's diverticulum 08/2004   with ischemia  . Myoma 11/2005   right ovary  . Sacroiliac dysfunction 06/23/2012  . Uterine adhesions      Social History   Socioeconomic History  . Marital status: Single    Spouse name: Not on file  . Number of children: 3  . Years of education: Not on file  . Highest education level: 12th grade  Occupational History  . Occupation: Surveyor, quantity: [employer]  . Financial resource strain: Not on file  . Food insecurity:    Worry: Not on file    Inability: Not on file  . Transportation needs:    Medical: Not on file    Non-medical: Not on file  Tobacco Use  . Smoking status: Never Smoker  . Smokeless tobacco: Never Used  Substance and Sexual Activity  . Alcohol use: Yes    Comment: social  . Drug use: No  . Sexual activity: Yes  Lifestyle  . Physical activity:    Days per week: Not on file    Minutes per session: Not on file  . Stress: Not on file  Relationships  . Social connections:    Talks on  phone: Not on file    Gets together: Not on file    Attends religious service: Not on file    Active member of club or organization: Not on file    Attends meetings of clubs or organizations: Not on file    Relationship status: Not on file  . Intimate partner violence:    Fear of current or ex partner: Not on file    Emotionally abused: Not on file    Physically abused: Not on file    Forced sexual activity: Not on file  Other Topics Concern  . Not on file  Social History Narrative   Lives with daughter (grand-daughter)   Daughter- lives in [place]- lives locally   Works at [employer]   Completed HS   No pets   Enjoys walking, mo   vies, Psychologist, clinical, bowling      Patient is right-handed. Her daughter and granddaughter live with her in a 2 story house. She drinks one cup of coffee a day in the winter months. She drinks tea occasionally. She walks daily, weather permitting.    Past Surgical History:  Procedure Laterality Date  .  ABDOMINAL HYSTERECTOMY  11/2005  . bowel reconstruction    . LAPAROSCOPIC SALPINGOOPHERECTOMY  11/2005   right-- assisted with daVinci robot, and lysis of omental adhesions.  Marland Kitchen LAPAROSCOPY  11/2005   total laparoscopy  . OTHER SURGICAL HISTORY     Meckel's diverticulectomy, small bowel resection and resection of retrocecal appendix.    Family History  Problem Relation Age of Onset  . Hypertension Other   . Hypertension Mother   . Hypertension Father   . Cancer Neg Hx   . Colon cancer Neg Hx   . Esophageal cancer Neg Hx   . Rectal cancer Neg Hx   . Stomach cancer Neg Hx   . Pancreatic cancer Neg Hx     No Known Allergies  Current Outpatient Medications on File Prior to Visit  Medication Sig Dispense Refill  . flurbiprofen (ANSAID) 100 MG tablet Take 100 mg by mouth as directed.    . flurbiprofen (ANSAID) 100 MG tablet Take 1 tablet (100 mg total) by mouth as directed. One tablet Q8H PRN. Maximum of 300mg  in a 24 hr period 15 tablet 5  .  hydrochlorothiazide (HYDRODIURIL) 25 MG tablet TAKE 1 TABLET (25 MG TOTAL) BY MOUTH DAILY. 90 tablet 1  . lisinopril (PRINIVIL,ZESTRIL) 20 MG tablet TAKE 1 TABLET (20 MG TOTAL) BY MOUTH DAILY. 90 tablet 1  . naproxen sodium (ALEVE) 220 MG tablet Take 220 mg by mouth as needed.    . nebivolol (BYSTOLIC) 5 MG tablet Take 1 tablet (5 mg total) by mouth daily. 90 tablet 1  . zolmitriptan (ZOMIG) 5 MG nasal solution Place 1 spray into the nose as needed for migraine. May repeat in 2 hours if neeeded; no more than 2 in 24 hours. 2 Units 0  . zolmitriptan (ZOMIG) 5 MG nasal solution Place 1 spray into the nose as needed for migraine. May repeat in 2 hours if needed, No more than 2/24hrs 6 Units 3   No current facility-administered medications on file prior to visit.     BP (!) 143/88 (BP Location: Right Arm, Patient Position: Sitting, Cuff Size: Small)   Pulse (!) 56   Temp 98.2 F (36.8 C) (Oral)   Resp 16   Ht 5\' 3"  (1.6 m)   Wt 169 lb (76.7 kg)   LMP 11/10/2005   SpO2 100%   BMI 29.94 kg/m       Objective:   Physical Exam  Constitutional: She is oriented to person, place, and time. She appears well-developed and well-nourished.  Cardiovascular: Normal rate, regular rhythm and normal heart sounds.  No murmur heard. Pulmonary/Chest: Effort normal and breath sounds normal. No respiratory distress. She has no wheezes.  Neurological: She is alert and oriented to person, place, and time.  Skin: Skin is warm and dry.  Psychiatric: She has a normal mood and affect. Her behavior is normal. Judgment and thought content normal.          Assessment & Plan:  Hypertension-improved.  Continue current medications.  Migraines-stable/improved.  Defer management to urology.  Pelvic pain-this is improved.  She reports negative pelvic ultrasound performed by urgent care.  Will monitor for now.  Estrogen deficiency-she has not had a bone density.  She has been without estrogen for the last 13  years.  Will obtain bone density.  We discussed weightbearing exercise and importance of adequate calcium intake suggested Caltrate 600 plus D twice daily.  She plans to get a flu shot today with her employer.

## 2017-10-18 NOTE — Patient Instructions (Addendum)
Please schedule bone density on the first floor.  

## 2017-11-07 NOTE — Progress Notes (Signed)
Submitted on cover my meds

## 2017-12-26 NOTE — Progress Notes (Signed)
NEUROLOGY FOLLOW UP OFFICE NOTE  KYLE STANSELL [id number]  HISTORY OF PRESENT ILLNESS: [name] is a 52 year old right-handed female with hypertension who follows up for migraines.  UPDATE: Zomig NS works but her insurance denied it. Intensity:  Mild-moderate:  N/A; severe:  10/10 Duration:  Mild-moderate:  N/A; severe:  Woke up with it, took Zomig NS and intensity reduced from 10/10 to 5-6/10 and resolved within 3 hours.  She didn't repeat it. Frequency:  Mild-moderate:  None since last time; severe:  Once since last visit (woke up with it) Frequency of abortive medication:  once Rescue protocol: Mild headaches- first-line flurbiprofen or naproxen, second line Relpax; severe headaches- Zomig 5 mg NS  Current NSAIDS: Flurbiprofen, naproxen Current analgesics: None Current triptans: Relpax 40 mg, Zomig 5 mg NS Current ergotamine: None Current anti-emetic: None Current muscle relaxants: None Current anti-anxiolytic: None Current sleep aide: None Current Antihypertensive medications: Lisinopril, Bystolic, HCTZ Current Antidepressant medications: None Current Anticonvulsant medications: None Current anti-CGRP: None Current Vitamins/Herbal/Supplements: None Current Antihistamines/Decongestants: None Other therapy: None Remote/birth control: None  Caffeine: Rarely coffee or sweet tea Alcohol: No Smoker: No Diet: Hydrates.  No soda Exercise: Walks daily Depression: No; Anxiety: No Other pain: No Sleep hygiene: Good  HISTORY:  Onset: Mid 20s Location:  Unilateral either side (retro-orbital to back of head, right greater than left) and into neck Quality:  throbbing Initial intensity:  Mild-moderate and severe.  She denies new headache, thunderclap headache or severe headache that wakes from sleep. Aura:  no Prodrome:  no Postdrome:  no Associated symptoms: Conjunctival injection, dizziness, nausea, photophobia, phonophobia, blurred vision.  She denies associated unilateral  numbness or weakness. Initial duration:  Mild-moderate:  1/2 day; Severe: 3 days. Often wakes up with migraine. Initial Frequency:  Mild-moderate:  2 days a month; Severe: 1 every 3 months Initial Frequency of abortive medication: 2 days a month Triggers: Hot dogs Relieving factors:  Heating pad, cold pack or hot shower on neck Activity:  Severe aggravates  Past NSAIDS:  ibuprofen Past analgesics:  Excedrin Migraine, Tylenol Past abortive triptans:  Sumatriptan tablet Past muscle relaxants:  Flexeril, Robaxin Past anti-emetic:  no Past antihypertensive medications:  no Past antidepressant medications:  no Past anticonvulsant medications:  topiramate (not sure why taken off) Past vitamins/Herbal/Supplements:  no Past antihistamines/decongestants:  no Other past therapies:  no  Family history of headache:  no  PAST MEDICAL HISTORY: Past Medical History:  Diagnosis Date  . Allergy   . Anemia   . Arthritis    left knee  . Headache(784.0)    Chronic migraines--Dr Lewitt  . Hypertension   . Meckel's diverticulum 08/2004   with ischemia  . Myoma 11/2005   right ovary  . Sacroiliac dysfunction 06/23/2012  . Uterine adhesions     MEDICATIONS: Current Outpatient Medications on File Prior to Visit  Medication Sig Dispense Refill  . Calcium Carbonate-Vitamin D 600-400 MG-UNIT chew tablet Chew 1 tablet by mouth daily.    . flurbiprofen (ANSAID) 100 MG tablet Take 100 mg by mouth as directed.    . flurbiprofen (ANSAID) 100 MG tablet Take 1 tablet (100 mg total) by mouth as directed. One tablet Q8H PRN. Maximum of 300mg  in a 24 hr period 15 tablet 5  . hydrochlorothiazide (HYDRODIURIL) 25 MG tablet TAKE 1 TABLET (25 MG TOTAL) BY MOUTH DAILY. 90 tablet 1  . lisinopril (PRINIVIL,ZESTRIL) 20 MG tablet TAKE 1 TABLET (20 MG TOTAL) BY MOUTH DAILY. 90 tablet 1  . naproxen sodium (  ALEVE) 220 MG tablet Take 220 mg by mouth as needed.    . nebivolol (BYSTOLIC) 5 MG tablet Take 1 tablet (5 mg  total) by mouth daily. 90 tablet 1  . zolmitriptan (ZOMIG) 5 MG nasal solution Place 1 spray into the nose as needed for migraine. May repeat in 2 hours if neeeded; no more than 2 in 24 hours. 2 Units 0  . zolmitriptan (ZOMIG) 5 MG nasal solution Place 1 spray into the nose as needed for migraine. May repeat in 2 hours if needed, No more than 2/24hrs 6 Units 3   No current facility-administered medications on file prior to visit.     ALLERGIES: No Known Allergies  FAMILY HISTORY: Family History  Problem Relation Age of Onset  . Hypertension Other   . Hypertension Mother   . Hypertension Father   . Cancer Neg Hx   . Colon cancer Neg Hx   . Esophageal cancer Neg Hx   . Rectal cancer Neg Hx   . Stomach cancer Neg Hx   . Pancreatic cancer Neg Hx    SOCIAL HISTORY: Social History   Socioeconomic History  . Marital status: Single    Spouse name: Not on file  . Number of children: 3  . Years of education: Not on file  . Highest education level: 12th grade  Occupational History  . Occupation: Surveyor, quantity: [employer]  . Financial resource strain: Not on file  . Food insecurity:    Worry: Not on file    Inability: Not on file  . Transportation needs:    Medical: Not on file    Non-medical: Not on file  Tobacco Use  . Smoking status: Never Smoker  . Smokeless tobacco: Never Used  Substance and Sexual Activity  . Alcohol use: Yes    Comment: social  . Drug use: No  . Sexual activity: Yes  Lifestyle  . Physical activity:    Days per week: Not on file    Minutes per session: Not on file  . Stress: Not on file  Relationships  . Social connections:    Talks on phone: Not on file    Gets together: Not on file    Attends religious service: Not on file    Active member of club or organization: Not on file    Attends meetings of clubs or organizations: Not on file    Relationship status: Not on file  . Intimate partner violence:    Fear of current or ex  partner: Not on file    Emotionally abused: Not on file    Physically abused: Not on file    Forced sexual activity: Not on file  Other Topics Concern  . Not on file  Social History Narrative   Lives with daughter (grand-daughter)   Daughter- lives in [place]- lives locally   Works at [employer]   Completed HS   No pets   Enjoys walking, mo   vies, Psychologist, clinical, bowling      Patient is right-handed. Her daughter and granddaughter live with her in a 2 story house. She drinks one cup of coffee a day in the winter months. She drinks tea occasionally. She walks daily, weather permitting.    REVIEW OF SYSTEMS: Constitutional: No fevers, chills, or sweats, no generalized fatigue, change in appetite Eyes: No visual changes, double vision, eye pain Ear, nose and throat: No hearing loss, ear pain, nasal congestion, sore throat Cardiovascular:  No chest pain, palpitations Respiratory:  No shortness of breath at rest or with exertion, wheezes GastrointestinaI: No nausea, vomiting, diarrhea, abdominal pain, fecal incontinence Genitourinary:  No dysuria, urinary retention or frequency Musculoskeletal:  No neck pain, back pain Integumentary: No rash, pruritus, skin lesions Neurological: as above Psychiatric: No depression, insomnia, anxiety Endocrine: No palpitations, fatigue, diaphoresis, mood swings, change in appetite, change in weight, increased thirst Hematologic/Lymphatic:  No purpura, petechiae. Allergic/Immunologic: no itchy/runny eyes, nasal congestion, recent allergic reactions, rashes  PHYSICAL EXAM: Blood pressure 104/82, pulse (!) 59, height 5\' 3"  (1.6 m), weight 168 lb (76.2 kg), last menstrual period 11/10/2005, SpO2 98 %. General: No acute distress.  Patient appears well-groomed.   Head:  Normocephalic/atraumatic Eyes:  Fundi examined but not visualized Neck: supple, no paraspinal tenderness, full range of motion Heart:  Regular rate and rhythm Lungs:  Clear to auscultation  bilaterally Back: No paraspinal tenderness Neurological Exam: alert and oriented to person, place, and time. Attention span and concentration intact, recent and remote memory intact, fund of knowledge intact.  Speech fluent and not dysarthric, language intact.  CN II-XII intact. Bulk and tone normal, muscle strength 5/5 throughout.  Sensation to light touch  intact.  Deep tendon reflexes 2+ throughout, toes downgoing.  Finger to nose testing intact.  Gait normal, Romberg negative.  IMPRESSION: Migraine without aura, without status migrainosus, not intractable Will appeal to insurance company that while Relpax helps with mild to moderate migraines, it is ineffective for the severe migraines or when she wakes up with migraine.    PLAN: 1.  For abortive therapy:  Mild-moderate migraine:  1st line flurbiprofen or naproxen, 2nd line Relpax  Severe migraine:  Zomig NS (samples provided) 2.  Limit use of pain relievers to no more than 2 days out of week to prevent risk of rebound or medication-overuse headache. 3.  Keep headache diary 4.  Exercise, hydration, caffeine cessation, sleep hygiene, monitor for and avoid triggers 5.  Consider:  magnesium citrate 400mg  daily, riboflavin 400mg  daily, and coenzyme Q10 100mg  three times daily 6.  Follow up in 6 months.   Metta Clines, DO  CC: Debbrah Alar, NP

## 2017-12-27 ENCOUNTER — Encounter: Payer: Self-pay | Admitting: Neurology

## 2017-12-27 ENCOUNTER — Ambulatory Visit: Payer: 59 | Admitting: Neurology

## 2017-12-27 VITALS — BP 104/82 | HR 59 | Ht 63.0 in | Wt 168.0 lb

## 2017-12-27 DIAGNOSIS — G43009 Migraine without aura, not intractable, without status migrainosus: Secondary | ICD-10-CM

## 2017-12-27 MED ORDER — ZOLMITRIPTAN 5 MG NA SOLN: 1.0000 | NASAL | 0 refills | Status: DC | PRN

## 2017-12-27 NOTE — Patient Instructions (Addendum)
1.  For mild to moderate migraines, take flurbiprofen or naproxen as first line, then Relax 2.  For severe/wake up with migraine, take Zomig nasal spray.  Will appeal to insurance 3.  Limit use of pain relievers to no more than 2 days out of week to prevent risk of rebound or medication-overuse headache. 4.  Keep headache diary 5.  Follow up in 6 months.

## 2018-01-22 ENCOUNTER — Ambulatory Visit
Admission: RE | Admit: 2018-01-22 | Discharge: 2018-01-22 | Disposition: A | Discharge status: Home / Self Care | Payer: Managed Care, Other (non HMO) | Source: Ambulatory Visit | Attending: Family | Admitting: Family

## 2018-01-22 ENCOUNTER — Other Ambulatory Visit: Payer: Self-pay | Admitting: Family

## 2018-01-22 DIAGNOSIS — R921 Mammographic calcification found on diagnostic imaging of breast: Secondary | ICD-10-CM

## 2018-01-24 ENCOUNTER — Ambulatory Visit: Payer: Managed Care, Other (non HMO) | Admitting: Family

## 2018-01-31 ENCOUNTER — Ambulatory Visit: Payer: 59 | Admitting: Family

## 2018-01-31 ENCOUNTER — Encounter: Payer: Self-pay | Admitting: Family

## 2018-01-31 VITALS — BP 141/87 | HR 70 | Temp 98.5°F | Resp 18 | Ht 63.0 in | Wt 169.0 lb

## 2018-01-31 DIAGNOSIS — R1031 Right lower quadrant pain: Secondary | ICD-10-CM

## 2018-01-31 DIAGNOSIS — G8929 Other chronic pain: Secondary | ICD-10-CM

## 2018-01-31 DIAGNOSIS — M545 Low back pain, unspecified: Secondary | ICD-10-CM

## 2018-01-31 DIAGNOSIS — G43909 Migraine, unspecified, not intractable, without status migrainosus: Secondary | ICD-10-CM

## 2018-01-31 DIAGNOSIS — I10 Essential (primary) hypertension: Secondary | ICD-10-CM | POA: Diagnosis not present

## 2018-01-31 LAB — BASIC METABOLIC PANEL
BUN: 18 mg/dL (ref 6–23)
CO2: 33 mEq/L — ABNORMAL HIGH (ref 19–32)
Calcium: 10.2 mg/dL (ref 8.4–10.5)
Chloride: 101 mEq/L (ref 96–112)
Creatinine, Ser: 1 mg/dL (ref 0.40–1.20)
GFR: 70.12 mL/min (ref >60.00)
Glucose, Bld: 78 mg/dL (ref 70–99)
Potassium: 4.1 mEq/L (ref 3.5–5.1)
Sodium: 139 mEq/L (ref 135–145)

## 2018-01-31 MED ORDER — MELOXICAM 7.5 MG PO TABS: 7.5000 mg | ORAL_TABLET | Freq: Every day | ORAL | 0 refills | Status: DC | PRN

## 2018-01-31 MED ORDER — NEBIVOLOL HCL 10 MG PO TABS: 10.0000 mg | ORAL_TABLET | Freq: Every day | ORAL | 5 refills | Status: DC

## 2018-01-31 NOTE — Patient Instructions (Addendum)
Please begin meloxicam once daily as needed for back pain. Perform exercises below twice daily. Increase bystolic from 5mg  to 10mg . You should be contacted about scheduling your CT scan.   Back Exercises The following exercises strengthen the muscles that help to support the back. They also help to keep the lower back flexible. Doing these exercises can help to prevent back pain or lessen existing pain. If you have back pain or discomfort, try doing these exercises 2-3 times each day or as told by your health care provider. When the pain goes away, do them once each day, but increase the number of times that you repeat the steps for each exercise (do more repetitions). If you do not have back pain or discomfort, do these exercises once each day or as told by your health care provider. Exercises Single Knee to Chest Repeat these steps 3-5 times for each leg: 1. Lie on your back on a firm bed or the floor with your legs extended. 2. Bring one knee to your chest. Your other leg should stay extended and in contact with the floor. 3. Hold your knee in place by grabbing your knee or thigh. 4. Pull on your knee until you feel a gentle stretch in your lower back. 5. Hold the stretch for 10-30 seconds. 6. Slowly release and straighten your leg. Pelvic Tilt Repeat these steps 5-10 times: 1. Lie on your back on a firm bed or the floor with your legs extended. 2. Bend your knees so they are pointing toward the ceiling and your feet are flat on the floor. 3. Tighten your lower abdominal muscles to press your lower back against the floor. This motion will tilt your pelvis so your tailbone points up toward the ceiling instead of pointing to your feet or the floor. 4. With gentle tension and even breathing, hold this position for 5-10 seconds. Cat-Cow Repeat these steps until your lower back becomes more flexible: 1. Get into a hands-and-knees position on a firm surface. Keep your hands under your  shoulders, and keep your knees under your hips. You may place padding under your knees for comfort. 2. Let your head hang down, and point your tailbone toward the floor so your lower back becomes rounded like the back of a cat. 3. Hold this position for 5 seconds. 4. Slowly lift your head and point your tailbone up toward the ceiling so your back forms a sagging arch like the back of a cow. 5. Hold this position for 5 seconds.  Press-Ups Repeat these steps 5-10 times: 1. Lie on your abdomen (face-down) on the floor. 2. Place your palms near your head, about shoulder-width apart. 3. While you keep your back as relaxed as possible and keep your hips on the floor, slowly straighten your arms to raise the top half of your body and lift your shoulders. Do not use your back muscles to raise your upper torso. You may adjust the placement of your hands to make yourself more comfortable. 4. Hold this position for 5 seconds while you keep your back relaxed. 5. Slowly return to lying flat on the floor.  Bridges Repeat these steps 10 times: 1. Lie on your back on a firm surface. 2. Bend your knees so they are pointing toward the ceiling and your feet are flat on the floor. 3. Tighten your buttocks muscles and lift your buttocks off of the floor until your waist is at almost the same height as your knees. You should feel the  muscles working in your buttocks and the back of your thighs. If you do not feel these muscles, slide your feet 1-2 inches farther away from your buttocks. 4. Hold this position for 3-5 seconds. 5. Slowly lower your hips to the starting position, and allow your buttocks muscles to relax completely. If this exercise is too easy, try doing it with your arms crossed over your chest. Abdominal Crunches Repeat these steps 5-10 times: 1. Lie on your back on a firm bed or the floor with your legs extended. 2. Bend your knees so they are pointing toward the ceiling and your feet are flat on  the floor. 3. Cross your arms over your chest. 4. Tip your chin slightly toward your chest without bending your neck. 5. Tighten your abdominal muscles and slowly raise your trunk (torso) high enough to lift your shoulder blades a tiny bit off of the floor. Avoid raising your torso higher than that, because it can put too much stress on your low back and it does not help to strengthen your abdominal muscles. 6. Slowly return to your starting position. Back Lifts Repeat these steps 5-10 times: 1. Lie on your abdomen (face-down) with your arms at your sides, and rest your forehead on the floor. 2. Tighten the muscles in your legs and your buttocks. 3. Slowly lift your chest off of the floor while you keep your hips pressed to the floor. Keep the back of your head in line with the curve in your back. Your eyes should be looking at the floor. 4. Hold this position for 3-5 seconds. 5. Slowly return to your starting position. Contact a health care provider if:  Your back pain or discomfort gets much worse when you do an exercise.  Your back pain or discomfort does not lessen within 2 hours after you exercise. If you have any of these problems, stop doing these exercises right away. Do not do them again unless your health care provider says that you can. Get help right away if:  You develop sudden, severe back pain. If this happens, stop doing the exercises right away. Do not do them again unless your health care provider says that you can. This information is not intended to replace advice given to you by your health care provider. Make sure you discuss any questions you have with your health care provider. Document Released: 02/04/2004 Document Revised: 05/02/2017 Document Reviewed: 02/20/2014 Elsevier Interactive Patient Education  [employer].

## 2018-01-31 NOTE — Progress Notes (Signed)
Subjective:    Patient ID: [name], female    DOB: 1965/09/17, 53 y.o.   MRN: [id number]  HPI  Patient is a 53 yr old female who presents today for routine follow up.   HTN-current medication includes Bystolic, hydrochlorothiazide, and lisinopril.  She reports good compliance with medications. BP Readings from Last 3 Encounters:  01/31/18 (!) 141/87  12/27/17 104/82  10/18/17 (!) 143/88   Migraines- followed by neurology. Uses zomig nasal spray on an as needed basis. Reports that migraines  She also reports that she has daily low back pain which is worse on the right.  Sometimes his pain will wrap around to the right lower abdomen.  She also has some chronic abdominal tenderness.  This is located on the right lower quadrant.  This is been going on for greater than 1 year.  She did have some imaging performed back in April 2018 which was negative.  She is status post TAH/BSO.   Review of Systems   See HPI  Past Medical History:  Diagnosis Date  . Allergy   . Anemia   . Arthritis    left knee  . Headache(784.0)    Chronic migraines--Dr Lewitt  . Hypertension   . Meckel's diverticulum 08/2004   with ischemia  . Myoma 11/2005   right ovary  . Sacroiliac dysfunction 06/23/2012  . Uterine adhesions      Social History   Socioeconomic History  . Marital status: Single    Spouse name: Not on file  . Number of children: 3  . Years of education: Not on file  . Highest education level: 12th grade  Occupational History  . 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 Financial resource strain: Not on file  . Food insecurity:    Worry: Not on file    Inability: Not on file  . Transportation needs:    Medical: Not on file    Non-medical: Not on file  Tobacco Use  . Smoking status: Never Smoker  . Smokeless tobacco: Never Used  Substance and Sexual Activity  . Alcohol use: Yes    Comment: social  . Drug use: No  . Sexual activity: Yes  Lifestyle  .  Physical activity:    Days per week: Not on file    Minutes per session: Not on file  . Stress: Not on file  Relationships  . Social connections:    Talks on phone: Not on file    Gets together: Not on file    Attends religious service: Not on file    Active member of club or organization: Not on file    Attends meetings of clubs or organizations: Not on file    Relationship status: Not on file  . Intimate partner violence:    Fear of current or ex partner: Not on file    Emotionally abused: Not on file    Physically abused: Not on file    Forced sexual activity: Not on file  Other Topics Concern  . Not on file  Social History Narrative   Lives with daughter (grand-daughter)   Daughter- lives in [place]- lives locally   Works at [employer]   Completed HS   No pets   Enjoys walking, mo   vies, Psychologist, clinical, bowling      Patient is right-handed. Her daughter and granddaughter live with her in a 2 story house. She drinks one cup of coffee a day  in the winter months. She drinks tea occasionally. She walks daily, weather permitting.    Past Surgical History:  Procedure Laterality Date  . ABDOMINAL HYSTERECTOMY  11/2005  . bowel reconstruction    . LAPAROSCOPIC SALPINGOOPHERECTOMY  11/2005   right-- assisted with daVinci robot, and lysis of omental adhesions.  Marland Kitchen LAPAROSCOPY  11/2005   total laparoscopy  . OTHER SURGICAL HISTORY     Meckel's diverticulectomy, small bowel resection and resection of retrocecal appendix.    Family History  Problem Relation Age of Onset  . Hypertension Other   . Hypertension Mother   . Hypertension Father   . Cancer Neg Hx   . Colon cancer Neg Hx   . Esophageal cancer Neg Hx   . Rectal cancer Neg Hx   . Stomach cancer Neg Hx   . Pancreatic cancer Neg Hx     No Known Allergies  Current Outpatient Medications on File Prior to Visit  Medication Sig Dispense Refill  . Calcium Carbonate-Vitamin D 600-400 MG-UNIT chew tablet Chew 1 tablet by  mouth daily.    . flurbiprofen (ANSAID) 100 MG tablet Take 100 mg by mouth as directed.    . flurbiprofen (ANSAID) 100 MG tablet Take 1 tablet (100 mg total) by mouth as directed. One tablet Q8H PRN. Maximum of 300mg  in a 24 hr period 15 tablet 5  . hydrochlorothiazide (HYDRODIURIL) 25 MG tablet TAKE 1 TABLET (25 MG TOTAL) BY MOUTH DAILY. 90 tablet 1  . lisinopril (PRINIVIL,ZESTRIL) 20 MG tablet TAKE 1 TABLET (20 MG TOTAL) BY MOUTH DAILY. 90 tablet 1  . naproxen sodium (ALEVE) 220 MG tablet Take 220 mg by mouth as needed.    . nebivolol (BYSTOLIC) 5 MG tablet Take 1 tablet (5 mg total) by mouth daily. 90 tablet 1  . zolmitriptan (ZOMIG) 5 MG nasal solution Place 1 spray into the nose as needed for migraine. 2 Units 0   No current facility-administered medications on file prior to visit.     BP (!) 141/87 (BP Location: Right Arm, Patient Position: Sitting, Cuff Size: Small)   Pulse 70   Temp 98.5 F (36.9 C) (Oral)   Resp 18   Ht 5\' 3"  (1.6 m)   Wt 169 lb (76.7 kg)   LMP 11/10/2005   SpO2 100%   BMI 29.94 kg/m    Objective:   Physical Exam Constitutional:      Appearance: She is well-developed.  Neck:     Musculoskeletal: Neck supple.     Thyroid: No thyromegaly.  Cardiovascular:     Rate and Rhythm: Normal rate and regular rhythm.     Heart sounds: Normal heart sounds. No murmur.  Pulmonary:     Effort: Pulmonary effort is normal. No respiratory distress.     Breath sounds: Normal breath sounds. No wheezing.  Abdominal:     General: There is no distension.     Palpations: Abdomen is soft.     Tenderness: There is abdominal tenderness in the right lower quadrant.  Skin:    General: Skin is warm and dry.  Neurological:     Mental Status: She is alert and oriented to person, place, and time.  Psychiatric:        Behavior: Behavior normal.        Thought Content: Thought content normal.        Judgment: Judgment normal.           Assessment & Plan:  Right lower  quadrant pain-this is  been present for greater than 1 year.  She had a CT scan performed back in April 2018 which was negative.  However symptoms persist.  I would like to reimage her abdomen to rule out any concerning etiology.  Low back pain- reports chronic daily low back pain worse on the right.  Will give trial of meloxicam once daily as needed.  Hypertension-blood pressure is above goal.  Will increase Bystolic from 5 mg to 10 mg.  Migraines-stable with as needed Zomig.  This is being managed by neurology.

## 2018-02-09 ENCOUNTER — Telehealth: Payer: Self-pay | Admitting: Family

## 2018-02-09 NOTE — Telephone Encounter (Signed)
Copied from [place] [phone] [id number]. Topic: Quick Communication - See Telephone Encounter >> Feb 09, 2018  3:30 PM Loma Boston wrote: CRM for notification. See Telephone encounter for: 02/09/18. Patient Services needs a authorization for a procedure for pt from Debbrah Alar or her nurse.  Call Ciarra at [phone] till 4:30

## 2018-02-11 ENCOUNTER — Encounter (HOSPITAL_BASED_OUTPATIENT_CLINIC_OR_DEPARTMENT_OTHER): Payer: Self-pay

## 2018-02-11 ENCOUNTER — Ambulatory Visit (HOSPITAL_BASED_OUTPATIENT_CLINIC_OR_DEPARTMENT_OTHER)
Admission: RE | Admit: 2018-02-11 | Discharge: 2018-02-11 | Disposition: A | Discharge status: Home / Self Care | Payer: 59 | Source: Ambulatory Visit | Attending: Family | Admitting: Family

## 2018-02-11 DIAGNOSIS — R1031 Right lower quadrant pain: Secondary | ICD-10-CM | POA: Insufficient documentation

## 2018-02-11 MED ORDER — IOPAMIDOL (ISOVUE-300) INJECTION 61%
100.0000 mL | Freq: Once | INTRAVENOUS | Status: AC | PRN
  Administered 2018-02-11: 100 mL via INTRAVENOUS

## 2018-02-12 NOTE — Telephone Encounter (Signed)
Called Ciarra but n/a. Will call again tomorrow am.

## 2018-02-13 ENCOUNTER — Telehealth: Payer: Self-pay | Admitting: Family

## 2018-02-13 NOTE — Telephone Encounter (Signed)
Please advise pt that CT does not show obvious cause for the pain in her right lower abdomen. She has a cyst on her left kidney which does not appear concerning. I really think her abdominal pain is referred from her right lower back pain.

## 2018-02-13 NOTE — Telephone Encounter (Signed)
Please see below. Also, if she is willing we could refer her to PT for back pain to see if this helps.

## 2018-02-14 NOTE — Telephone Encounter (Signed)
Results given to patient in detail.

## 2018-02-14 NOTE — Telephone Encounter (Signed)
Patient had ct

## 2018-03-07 ENCOUNTER — Ambulatory Visit: Payer: 59 | Admitting: Family

## 2018-03-14 ENCOUNTER — Ambulatory Visit: Payer: 59 | Admitting: Family

## 2018-03-14 ENCOUNTER — Encounter: Payer: Self-pay | Admitting: Family

## 2018-03-14 VITALS — BP 129/74 | HR 55 | Temp 98.5°F | Resp 16 | Ht 63.0 in | Wt 168.0 lb

## 2018-03-14 DIAGNOSIS — I10 Essential (primary) hypertension: Secondary | ICD-10-CM | POA: Diagnosis not present

## 2018-03-14 DIAGNOSIS — R1031 Right lower quadrant pain: Secondary | ICD-10-CM

## 2018-03-14 DIAGNOSIS — G8929 Other chronic pain: Secondary | ICD-10-CM | POA: Diagnosis not present

## 2018-03-14 NOTE — Progress Notes (Signed)
Subjective:    Patient ID: [name], female    DOB: 06-Dec-1965, 53 y.o.   MRN: [id number]  HPI  Patient is a 53 yr old female who presents today for follow up.  HTN- last visit we increased bystolic from 5mg  to 10mg  once daily.   BP Readings from Last 3 Encounters:  03/14/18 129/74  01/31/18 (!) 141/87  12/27/17 104/82   RLQ abdominal pain- intermittent but overall some better. CT abd was negative.   Review of Systems See HPI  Past Medical History:  Diagnosis Date  . Allergy   . Anemia   . Arthritis    left knee  . Headache(784.0)    Chronic migraines--Dr Lewitt  . Hypertension   . Meckel's diverticulum 08/2004   with ischemia  . Myoma 11/2005   right ovary  . Sacroiliac dysfunction 06/23/2012  . Uterine adhesions      Social History   Socioeconomic History  . Marital status: Single    Spouse name: Not on file  . Number of children: 3  . Years of education: Not on file  . Highest education level: 12th grade  Occupational History  . Occupation: Surveyor, quantity: [employer]  . Financial resource strain: Not on file  . Food insecurity:    Worry: Not on file    Inability: Not on file  . Transportation needs:    Medical: Not on file    Non-medical: Not on file  Tobacco Use  . Smoking status: Never Smoker  . Smokeless tobacco: Never Used  Substance and Sexual Activity  . Alcohol use: Yes    Comment: social  . Drug use: No  . Sexual activity: Yes  Lifestyle  . Physical activity:    Days per week: Not on file    Minutes per session: Not on file  . Stress: Not on file  Relationships  . Social connections:    Talks on phone: Not on file    Gets together: Not on file    Attends religious service: Not on file    Active member of club or organization: Not on file    Attends meetings of clubs or organizations: Not on file    Relationship status: Not on file  . Intimate partner violence:    Fear of current or ex partner: Not on file   Emotionally abused: Not on file    Physically abused: Not on file    Forced sexual activity: Not on file  Other Topics Concern  . Not on file  Social History Narrative   Lives with daughter (grand-daughter)   Daughter- lives in [place]- lives locally   Works at [employer]   Completed HS   No pets   Enjoys walking, mo   vies, Psychologist, clinical, bowling      Patient is right-handed. Her daughter and granddaughter live with her in a 2 story house. She drinks one cup of coffee a day in the winter months. She drinks tea occasionally. She walks daily, weather permitting.    Past Surgical History:  Procedure Laterality Date  . ABDOMINAL HYSTERECTOMY  11/2005  . bowel reconstruction    . LAPAROSCOPIC SALPINGOOPHERECTOMY  11/2005   right-- assisted with daVinci robot, and lysis of omental adhesions.  Marland Kitchen LAPAROSCOPY  11/2005   total laparoscopy  . OTHER SURGICAL HISTORY     Meckel's diverticulectomy, small bowel resection and resection of retrocecal appendix.    Family History  Problem  Relation Age of Onset  . Hypertension Other   . Hypertension Mother   . Hypertension Father   . Cancer Neg Hx   . Colon cancer Neg Hx   . Esophageal cancer Neg Hx   . Rectal cancer Neg Hx   . Stomach cancer Neg Hx   . Pancreatic cancer Neg Hx     No Known Allergies  Current Outpatient Medications on File Prior to Visit  Medication Sig Dispense Refill  . Calcium Carbonate-Vitamin D 600-400 MG-UNIT chew tablet Chew 1 tablet by mouth daily.    . flurbiprofen (ANSAID) 100 MG tablet Take 100 mg by mouth as directed.    . flurbiprofen (ANSAID) 100 MG tablet Take 1 tablet (100 mg total) by mouth as directed. One tablet Q8H PRN. Maximum of 300mg  in a 24 hr period 15 tablet 5  . hydrochlorothiazide (HYDRODIURIL) 25 MG tablet TAKE 1 TABLET (25 MG TOTAL) BY MOUTH DAILY. 90 tablet 1  . lisinopril (PRINIVIL,ZESTRIL) 20 MG tablet TAKE 1 TABLET (20 MG TOTAL) BY MOUTH DAILY. 90 tablet 1  . meloxicam (MOBIC) 7.5 MG  tablet Take 1 tablet (7.5 mg total) by mouth daily as needed for pain (back pain). 30 tablet 0  . naproxen sodium (ALEVE) 220 MG tablet Take 220 mg by mouth as needed.    . nebivolol (BYSTOLIC) 10 MG tablet Take 1 tablet (10 mg total) by mouth daily. 30 tablet 5  . zolmitriptan (ZOMIG) 5 MG nasal solution Place 1 spray into the nose as needed for migraine. 2 Units 0   No current facility-administered medications on file prior to visit.     BP 129/74 (BP Location: Right Arm, Patient Position: Sitting, Cuff Size: Small)   Pulse (!) 55   Temp 98.5 F (36.9 C) (Oral)   Resp 16   Ht 5\' 3"  (1.6 m)   Wt 168 lb (76.2 kg)   LMP 11/10/2005   SpO2 100%   BMI 29.76 kg/m       Objective:   Physical Exam Constitutional:      Appearance: She is well-developed.  Neck:     Musculoskeletal: Neck supple.     Thyroid: No thyromegaly.  Cardiovascular:     Rate and Rhythm: Normal rate and regular rhythm.     Heart sounds: Normal heart sounds. No murmur.  Pulmonary:     Effort: Pulmonary effort is normal. No respiratory distress.     Breath sounds: Normal breath sounds. No wheezing.  Skin:    General: Skin is warm and dry.  Neurological:     Mental Status: She is alert and oriented to person, place, and time.  Psychiatric:        Behavior: Behavior normal.        Thought Content: Thought content normal.        Judgment: Judgment normal.           Assessment & Plan:  HTN- bp is stable/improved on current meds/doses. Continue same.  RLQ pain- likely radicular pain from lumbar spine.  Improved. Monitor.

## 2018-07-03 NOTE — Progress Notes (Signed)
Virtual Visit via Video Note The purpose of this virtual visit is to provide medical care while limiting exposure to the novel coronavirus.    Consent was obtained for video visit:  Yes.   Answered questions that patient had about telehealth interaction:  Yes.   I discussed the limitations, risks, security and privacy concerns of performing an evaluation and management service by telemedicine. I also discussed with the patient that there may be a patient responsible charge related to this service. The patient expressed understanding and agreed to proceed.  Pt location: Home Physician Location: Home Name of referring provider:  Debbrah Alar, NP I connected with [name] at patients initiation/request on 07/04/2018 at  9:50 AM EDT by video enabled telemedicine application and verified that I am speaking with the correct person using two identifiers. Pt MRN:  [id number] Pt DOB:  [birth year] Video Participants:  [name]   History of Present Illness:  [name] is a 53 year old right-handed female with hypertension who follows up for migraines.  UPDATE: Intensity:  Mild-moderate:  3-5/10; severe:  10/10 Duration:  Mild-moderate:  4 hours; severe:  Within 30 minutes Frequency:  Mild-moderate:  2 times in past 6 months; severe:  1 time in past 6 months. Frequency of abortive medication:  once Rescue protocol: Mild headaches- first-line flurbiprofen or naproxen, second line Relpax; severe headaches- Zomig 5 mg NS  Current NSAIDS: Flurbiprofen, naproxen Current analgesics: None Current triptans: Relpax 40 mg, Zomig 5 mg NS Current ergotamine: None Current anti-emetic: None Current muscle relaxants: None Current anti-anxiolytic: None Current sleep aide: None Current Antihypertensive medications: Lisinopril, Bystolic, HCTZ Current Antidepressant medications: None Current Anticonvulsant medications: None Current anti-CGRP: None Current Vitamins/Herbal/Supplements: None  Current Antihistamines/Decongestants: None Other therapy: None Remote/birth control: None  Caffeine: Rarely coffee or sweet tea Alcohol: No Smoker: No Diet: Hydrates.  No soda Exercise: Walks daily Depression: No; Anxiety: No Other pain: No Sleep hygiene: Good  HISTORY: Onset:  Mid-20s Location:Unilateral either side (retro-orbital to back of head, right greater than left) and into neck Quality:throbbing Initial intensity:Mild-moderate and severe.Shedenies new headache, thunderclap headache or severe headache that wakes from sleep. Aura:no Prodrome:no Postdrome:no Associated symptoms:  Conjunctival injections, dizziness, nausea, photophobia, phonophobia, blurred vision. She denies associated unilateral numbness or weakness. Initial duration:Mild-moderate: 1/2 day; Severe: 3 days. Often wakes up with migraine. Initial Frequency:Mild-moderate: 2 days a month; Severe: 1 every 3 months Initial Frequency of abortive medication:2 days a month Triggers:  Hot dogs Relieving factors:  Heating pad, cold pack or hot shower on neck Activity:Severe aggravates  Past NSAIDS:ibuprofen Past analgesics:Excedrin Migraine, Tylenol Past abortive triptans:Sumatriptan tablet Past muscle relaxants:Flexeril, Robaxin Past anti-emetic:no Past antihypertensive medications:no Past antidepressant medications:no Past anticonvulsant medications: topiramate (not sure why taken off) Past vitamins/Herbal/Supplements:no Past antihistamines/decongestants:no Other past therapies:no  Family history of headache:no  Past Medical History: Past Medical History:  Diagnosis Date  . Allergy   . Anemia   . Arthritis    left knee  . Headache(784.0)    Chronic migraines--Dr Lewitt  . Hypertension   . Meckel's diverticulum 08/2004   with ischemia  . Myoma 11/2005   right ovary  . Sacroiliac dysfunction 06/23/2012  . Uterine adhesions     Medications:  Outpatient Encounter Medications as of 07/04/2018  Medication Sig  . Calcium Carbonate-Vitamin D 600-400 MG-UNIT chew tablet Chew 1 tablet by mouth daily.  . flurbiprofen (ANSAID) 100 MG tablet Take 100 mg by mouth as directed.  . flurbiprofen (ANSAID) 100 MG tablet Take 1  tablet (100 mg total) by mouth as directed. One tablet Q8H PRN. Maximum of 300mg  in a 24 hr period  . hydrochlorothiazide (HYDRODIURIL) 25 MG tablet TAKE 1 TABLET (25 MG TOTAL) BY MOUTH DAILY.  Marland Kitchen lisinopril (PRINIVIL,ZESTRIL) 20 MG tablet TAKE 1 TABLET (20 MG TOTAL) BY MOUTH DAILY.  . meloxicam (MOBIC) 7.5 MG tablet Take 1 tablet (7.5 mg total) by mouth daily as needed for pain (back pain).  . naproxen sodium (ALEVE) 220 MG tablet Take 220 mg by mouth as needed.  . nebivolol (BYSTOLIC) 10 MG tablet Take 1 tablet (10 mg total) by mouth daily.  Marland Kitchen zolmitriptan (ZOMIG) 5 MG nasal solution Place 1 spray into the nose as needed for migraine.   No facility-administered encounter medications on file as of 07/04/2018.     Allergies: No Known Allergies  Family History: Family History  Problem Relation Age of Onset  . Hypertension Other   . Hypertension Mother   . Hypertension Father   . Cancer Neg Hx   . Colon cancer Neg Hx   . Esophageal cancer Neg Hx   . Rectal cancer Neg Hx   . Stomach cancer Neg Hx   . Pancreatic cancer Neg Hx     Social History: Social History   Socioeconomic History  . Marital status: Single    Spouse name: Not on file  . Number of children: 3  . Years of education: Not on file  . Highest education level: 12th grade  Occupational History  . Occupation: Surveyor, quantity: [employer]  . Financial resource strain: Not on file  . Food insecurity    Worry: Not on file    Inability: Not on file  . Transportation needs    Medical: Not on file    Non-medical: Not on file  Tobacco Use  . Smoking status: Never Smoker  . Smokeless tobacco: Never Used  Substance and Sexual Activity   . Alcohol use: Yes    Comment: social  . Drug use: No  . Sexual activity: Yes  Lifestyle  . Physical activity    Days per week: Not on file    Minutes per session: Not on file  . Stress: Not on file  Relationships  . Social Herbalist on phone: Not on file    Gets together: Not on file    Attends religious service: Not on file    Active member of club or organization: Not on file    Attends meetings of clubs or organizations: Not on file    Relationship status: Not on file  . Intimate partner violence    Fear of current or ex partner: Not on file    Emotionally abused: Not on file    Physically abused: Not on file    Forced sexual activity: Not on file  Other Topics Concern  . Not on file  Social History Narrative   Lives with daughter (grand-daughter)   Daughter- lives in [place]- lives locally   Works at [employer]   Completed HS   No pets   Enjoys walking, mo   vies, Psychologist, clinical, bowling      Patient is right-handed. Her daughter and granddaughter live with her in a 2 story house. She drinks one cup of coffee a day in the winter months. She drinks tea occasionally. She walks daily, weather permitting.   Observations/Objective:   Height 5\' 3"  (1.6 m), weight 164 lb (74.4 kg), last  menstrual period 11/10/2005. No acute distress. Alert and oriented. Speech fluent and not dysarthric.  Language intact.  Face symmetric.    Assessment and Plan:   Migraine without aura, without status migrainosus, not intractable  1.  Prreventative medication not needed. 2.  For abortive therapy, will appeal again for Zomig NS.  Stop Relpax as not effective.   3.  Limit use of pain relievers to no more than 2 days out of week to prevent risk of rebound or medication-overuse headache. 4.  Keep headache diary 5.  Exercise, hydration, caffeine cessation, sleep hygiene, monitor for and avoid triggers 6.  Consider:  magnesium citrate 400mg  daily, riboflavin 400mg  daily, and coenzyme  Q10 100mg  three times daily 7. Always keep in mind that currently taking a hormone or birth control may be a possible trigger or aggravating factor for migraine. 8. Follow up 6 months  Follow Up Instructions:    -I discussed the assessment and treatment plan with the patient. The patient was provided an opportunity to ask questions and all were answered. The patient agreed with the plan and demonstrated an understanding of the instructions.   The patient was advised to call back or seek an in-person evaluation if the symptoms worsen or if the condition fails to improve as anticipated.   Dudley Major, DO

## 2018-07-04 ENCOUNTER — Encounter: Payer: Self-pay | Admitting: Neurology

## 2018-07-04 ENCOUNTER — Other Ambulatory Visit: Payer: Self-pay

## 2018-07-04 ENCOUNTER — Telehealth (INDEPENDENT_AMBULATORY_CARE_PROVIDER_SITE_OTHER): Payer: 59 | Admitting: Neurology

## 2018-07-04 VITALS — Ht 63.0 in | Wt 164.0 lb

## 2018-07-04 DIAGNOSIS — G43009 Migraine without aura, not intractable, without status migrainosus: Secondary | ICD-10-CM

## 2018-08-08 ENCOUNTER — Ambulatory Visit
Admission: RE | Admit: 2018-08-08 | Discharge: 2018-08-08 | Disposition: A | Discharge status: Home / Self Care | Payer: 59 | Source: Ambulatory Visit | Attending: Family | Admitting: Family

## 2018-08-08 ENCOUNTER — Other Ambulatory Visit: Payer: Self-pay

## 2018-08-08 DIAGNOSIS — R921 Mammographic calcification found on diagnostic imaging of breast: Secondary | ICD-10-CM

## 2018-09-12 ENCOUNTER — Ambulatory Visit (INDEPENDENT_AMBULATORY_CARE_PROVIDER_SITE_OTHER): Payer: Managed Care, Other (non HMO) | Admitting: Family

## 2018-09-12 ENCOUNTER — Other Ambulatory Visit: Payer: Self-pay

## 2018-09-12 ENCOUNTER — Encounter: Payer: Self-pay | Admitting: Family

## 2018-09-12 VITALS — BP 127/88 | HR 71 | Temp 98.6°F | Ht 63.0 in | Wt 169.0 lb

## 2018-09-12 DIAGNOSIS — M545 Low back pain, unspecified: Secondary | ICD-10-CM

## 2018-09-12 DIAGNOSIS — I10 Essential (primary) hypertension: Secondary | ICD-10-CM | POA: Diagnosis not present

## 2018-09-12 MED ORDER — HYDROCHLOROTHIAZIDE 25 MG PO TABS: ORAL_TABLET | ORAL | 1 refills | Status: DC

## 2018-09-12 MED ORDER — LISINOPRIL 20 MG PO TABS: ORAL_TABLET | ORAL | 1 refills | Status: DC

## 2018-09-12 MED ORDER — NEBIVOLOL HCL 10 MG PO TABS: 10.0000 mg | ORAL_TABLET | Freq: Every day | ORAL | 5 refills | Status: DC

## 2018-09-12 NOTE — Progress Notes (Signed)
Virtual Visit via Video Note  I connected withThea E Cobb on 09/12/18 at  8:20 AM EDT by a video enabled telemedicine application and verified that I am speaking with the correct person using two identifiers.  Location: Patient: parked car Provider: home  I discussed the limitations of evaluation and management by telemedicine and the availability of in person appointments. The patient expressed understanding and agreed to proceed.  History of Present Illness:   HTN- bp this am 127/88. She denies CP/SOB or swelling. She is maintained on bystolic, lisinopril, hctz.     BP Readings from Last 3 Encounters:  09/12/18 127/88  03/14/18 129/74  01/31/18 (!) 141/87   Low back pain- reports that she is no longer taking meloxicam. Uses aleve prn sometimes on the days that she works.   Past Medical History:  Diagnosis Date  . Allergy   . Anemia   . Arthritis    left knee  . Headache(784.0)    Chronic migraines--Dr Lewitt  . Hypertension   . Meckel's diverticulum 08/2004   with ischemia  . Myoma 11/2005   right ovary  . Sacroiliac dysfunction 06/23/2012  . Uterine adhesions      Social History   Socioeconomic History  . Marital status: Single    Spouse name: Not on file  . Number of children: 3  . Years of education: Not on file  . Highest education level: 12th grade  Occupational History  . Occupation: Surveyor, quantity: [employer]  . Financial resource strain: Not on file  . Food insecurity    Worry: Not on file    Inability: Not on file  . Transportation needs    Medical: Not on file    Non-medical: Not on file  Tobacco Use  . Smoking status: Never Smoker  . Smokeless tobacco: Never Used  Substance and Sexual Activity  . Alcohol use: Yes    Comment: social  . Drug use: No  . Sexual activity: Yes  Lifestyle  . Physical activity    Days per week: Not on file    Minutes per session: Not on file  . Stress: Not on file  Relationships  .  Social Herbalist on phone: Not on file    Gets together: Not on file    Attends religious service: Not on file    Active member of club or organization: Not on file    Attends meetings of clubs or organizations: Not on file    Relationship status: Not on file  . Intimate partner violence    Fear of current or ex partner: Not on file    Emotionally abused: Not on file    Physically abused: Not on file    Forced sexual activity: Not on file  Other Topics Concern  . Not on file  Social History Narrative   Lives with daughter (grand-daughter)   Daughter- lives in [place]- lives locally   Works at [employer]   Completed HS   No pets   Enjoys walking, mo   vies, Psychologist, clinical, bowling      Patient is right-handed. Her daughter and granddaughter live with her in a 2 story house. She drinks one cup of coffee a day in the winter months. She drinks tea occasionally. She walks daily, weather permitting.    Past Surgical History:  Procedure Laterality Date  . ABDOMINAL HYSTERECTOMY  11/2005  . bowel reconstruction    . LAPAROSCOPIC  SALPINGOOPHERECTOMY  11/2005   right-- assisted with daVinci robot, and lysis of omental adhesions.  Marland Kitchen LAPAROSCOPY  11/2005   total laparoscopy  . OTHER SURGICAL HISTORY     Meckel's diverticulectomy, small bowel resection and resection of retrocecal appendix.    Family History  Problem Relation Age of Onset  . Hypertension Other   . Hypertension Mother   . Hypertension Father   . Cancer Neg Hx   . Colon cancer Neg Hx   . Esophageal cancer Neg Hx   . Rectal cancer Neg Hx   . Stomach cancer Neg Hx   . Pancreatic cancer Neg Hx     No Known Allergies  Current Outpatient Medications on File Prior to Visit  Medication Sig Dispense Refill  . Calcium Carbonate-Vitamin D 600-400 MG-UNIT chew tablet Chew 1 tablet by mouth daily.    . flurbiprofen (ANSAID) 100 MG tablet Take 1 tablet (100 mg total) by mouth as directed. One tablet Q8H PRN.  Maximum of 300mg  in a 24 hr period 15 tablet 5  . naproxen sodium (ALEVE) 220 MG tablet Take 220 mg by mouth as needed.    . zolmitriptan (ZOMIG) 5 MG nasal solution Place 1 spray into the nose as needed for migraine. 2 Units 0   No current facility-administered medications on file prior to visit.     BP 127/88 (BP Location: Left Arm, Patient Position: Sitting, Cuff Size: Large)   Pulse 71   Temp 98.6 F (37 C) (Oral)   Ht 5\' 3"  (1.6 m)   Wt 169 lb (76.7 kg)   LMP 11/10/2005   BMI 29.94 kg/m       Observations/Objective:   Gen: Awake, alert, no acute distress Resp: Breathing is even and non-labored Psych: calm/pleasant demeanor Neuro: Alert and Oriented x 3, + facial symmetry, speech is clear.   Assessment and Plan:  HTN- blood pressure is stable on current regimen. Continue same. Will have her come to the lab for follow up bmet.   Low back pain- stable with prn use of aleve.    I have encouraged her to get a flu shot this fall and she will obtain from her employer.    Follow Up Instructions:   I discussed the assessment and treatment plan with the patient. The patient was provided an opportunity to ask questions and all were answered. The patient agreed with the plan and demonstrated an understanding of the instructions.  The patient was advised to call back or seek an in-person evaluation if the symptoms worsen or if the condition fails to improve as anticipated.  Nance Pear, NP

## 2018-09-24 ENCOUNTER — Other Ambulatory Visit (INDEPENDENT_AMBULATORY_CARE_PROVIDER_SITE_OTHER): Payer: Managed Care, Other (non HMO)

## 2018-09-24 ENCOUNTER — Other Ambulatory Visit: Payer: Self-pay

## 2018-09-24 DIAGNOSIS — I10 Essential (primary) hypertension: Secondary | ICD-10-CM

## 2018-09-24 LAB — BASIC METABOLIC PANEL
BUN: 17 mg/dL (ref 6–23)
CO2: 29 mEq/L (ref 19–32)
Calcium: 9.8 mg/dL (ref 8.4–10.5)
Chloride: 102 mEq/L (ref 96–112)
Creatinine, Ser: 0.85 mg/dL (ref 0.40–1.20)
GFR: 84.38 mL/min (ref >60.00)
Glucose, Bld: 84 mg/dL (ref 70–99)
Potassium: 3.3 mEq/L — ABNORMAL LOW (ref 3.5–5.1)
Sodium: 139 mEq/L (ref 135–145)

## 2018-09-26 ENCOUNTER — Telehealth: Payer: Self-pay | Admitting: Family

## 2018-09-26 DIAGNOSIS — E876 Hypokalemia: Secondary | ICD-10-CM

## 2018-09-26 MED ORDER — POTASSIUM CHLORIDE CRYS ER 20 MEQ PO TBCR: 20.0000 meq | EXTENDED_RELEASE_TABLET | Freq: Every day | ORAL | 3 refills | Status: DC

## 2018-09-26 NOTE — Telephone Encounter (Signed)
Please advise pt that potassium is low. I would like her to add kdur 20 mEQ once daily. Repeat bmet in 1 week, dx hypokalemia.

## 2018-09-26 NOTE — Telephone Encounter (Signed)
Test ordered

## 2018-09-26 NOTE — Telephone Encounter (Signed)
Patient advised of results and to start medication,. Was scheduled to cone back 10-03-18 for bmet

## 2018-10-03 ENCOUNTER — Other Ambulatory Visit: Payer: Self-pay

## 2018-10-03 ENCOUNTER — Other Ambulatory Visit (INDEPENDENT_AMBULATORY_CARE_PROVIDER_SITE_OTHER): Payer: Managed Care, Other (non HMO)

## 2018-10-03 DIAGNOSIS — E876 Hypokalemia: Secondary | ICD-10-CM | POA: Diagnosis not present

## 2018-10-04 LAB — BASIC METABOLIC PANEL
BUN: 16 mg/dL (ref 6–23)
CO2: 28 mEq/L (ref 19–32)
Calcium: 9.9 mg/dL (ref 8.4–10.5)
Chloride: 104 mEq/L (ref 96–112)
Creatinine, Ser: 0.84 mg/dL (ref 0.40–1.20)
GFR: 85.53 mL/min (ref >60.00)
Glucose, Bld: 112 mg/dL — ABNORMAL HIGH (ref 70–99)
Potassium: 3.6 mEq/L (ref 3.5–5.1)
Sodium: 139 mEq/L (ref 135–145)

## 2018-12-10 ENCOUNTER — Other Ambulatory Visit: Payer: Self-pay

## 2018-12-11 ENCOUNTER — Other Ambulatory Visit: Payer: Self-pay

## 2018-12-11 ENCOUNTER — Encounter: Payer: Self-pay | Admitting: Family

## 2018-12-11 ENCOUNTER — Ambulatory Visit (INDEPENDENT_AMBULATORY_CARE_PROVIDER_SITE_OTHER): Payer: Managed Care, Other (non HMO) | Admitting: Family

## 2018-12-11 VITALS — BP 139/81 | HR 57 | Temp 96.6°F | Resp 16 | Ht 63.0 in | Wt 173.0 lb

## 2018-12-11 DIAGNOSIS — Z0000 Encounter for general adult medical examination without abnormal findings: Secondary | ICD-10-CM | POA: Diagnosis not present

## 2018-12-11 LAB — HEPATIC FUNCTION PANEL
ALT: 17 U/L (ref 0–35)
AST: 17 U/L (ref 0–37)
Albumin: 4.3 g/dL (ref 3.5–5.2)
Alkaline Phosphatase: 80 U/L (ref 39–117)
Bilirubin, Direct: 0.1 mg/dL (ref 0.0–0.3)
Total Bilirubin: 0.8 mg/dL (ref 0.2–1.2)
Total Protein: 7 g/dL (ref 6.0–8.3)

## 2018-12-11 LAB — CBC WITH DIFFERENTIAL/PLATELET
Basophils Absolute: 0 10*3/uL (ref 0.0–0.1)
Basophils Relative: 0.8 % (ref 0.0–3.0)
Eosinophils Absolute: 0.1 10*3/uL (ref 0.0–0.7)
Eosinophils Relative: 4 % (ref 0.0–5.0)
HCT: 41.6 % (ref 36.0–46.0)
Hemoglobin: 14.3 g/dL (ref 12.0–15.0)
Lymphocytes Relative: 26.4 % (ref 12.0–46.0)
Lymphs Abs: 1 10*3/uL (ref 0.7–4.0)
MCHC: 34.3 g/dL (ref 30.0–36.0)
MCV: 84.6 fl (ref 78.0–100.0)
Monocytes Absolute: 0.4 10*3/uL (ref 0.1–1.0)
Monocytes Relative: 10.2 % (ref 3.0–12.0)
Neutro Abs: 2.2 10*3/uL (ref 1.4–7.7)
Neutrophils Relative %: 58.6 % (ref 43.0–77.0)
Platelets: 241 10*3/uL (ref 150.0–400.0)
RBC: 4.92 Mil/uL (ref 3.87–5.11)
RDW: 12.9 % (ref 11.5–15.5)
WBC: 3.8 10*3/uL — ABNORMAL LOW (ref 4.0–10.5)

## 2018-12-11 LAB — LIPID PANEL
Cholesterol: 231 mg/dL — ABNORMAL HIGH (ref 0–200)
HDL: 36.4 mg/dL — ABNORMAL LOW (ref >39.00)
LDL Cholesterol: 161 mg/dL — ABNORMAL HIGH (ref 0–99)
NonHDL: 194.75
Total CHOL/HDL Ratio: 6
Triglycerides: 169 mg/dL — ABNORMAL HIGH (ref 0.0–149.0)
VLDL: 33.8 mg/dL (ref 0.0–40.0)

## 2018-12-11 LAB — BASIC METABOLIC PANEL
BUN: 17 mg/dL (ref 6–23)
CO2: 30 mEq/L (ref 19–32)
Calcium: 9.9 mg/dL (ref 8.4–10.5)
Chloride: 103 mEq/L (ref 96–112)
Creatinine, Ser: 0.88 mg/dL (ref 0.40–1.20)
GFR: 81 mL/min (ref >60.00)
Glucose, Bld: 84 mg/dL (ref 70–99)
Potassium: 3.8 mEq/L (ref 3.5–5.1)
Sodium: 140 mEq/L (ref 135–145)

## 2018-12-11 LAB — TSH: TSH: 1.44 u[IU]/mL (ref 0.35–4.50)

## 2018-12-11 NOTE — Progress Notes (Signed)
Subjective:     Patient ID: [name], female   DOB: 05-17-1965, 53 y.o.   MRN: [id number]  HPI   Patient presents today for complete physical.  Immunizations:  Flu shot up to date Diet: Healthy Exercise: has been limited due to back pain.  Has started walking again Colonoscopy: 2017  Dexa: 2019 Pap Smear: hysterectomy Mammogram: 08/06/18 Dental: up to date Vision: up to date Wt Readings from Last 3 Encounters:  12/11/18 173 lb (78.5 kg)  09/12/18 169 lb (76.7 kg)  07/04/18 164 lb (74.4 kg)      Review of Systems  Constitutional: Negative for unexpected weight change.  HENT: Negative for hearing loss and rhinorrhea.   Eyes: Negative for visual disturbance.  Respiratory: Negative for cough and shortness of breath.   Cardiovascular: Negative for chest pain.  Gastrointestinal: Negative for constipation and diarrhea.  Genitourinary: Negative for dysuria and frequency.  Musculoskeletal: Negative for arthralgias and myalgias.  Skin: Negative for rash.  Neurological: Negative for headaches.  Hematological: Negative for adenopathy.  Psychiatric/Behavioral:       Denies depression/anxiety   Past Medical History:  Diagnosis Date  . Allergy   . Anemia   . Arthritis    left knee  . Headache(784.0)    Chronic migraines--Dr Lewitt  . Hypertension   . Meckel's diverticulum 08/2004   with ischemia  . Myoma 11/2005   right ovary  . Sacroiliac dysfunction 06/23/2012  . Uterine adhesions      Social History   Socioeconomic History  . Marital status: Single    Spouse name: Not on file  . Number of children: 3  . Years of education: Not on file  . Highest education level: 12th grade  Occupational History  . Occupation: Surveyor, quantity: [employer]  . Financial resource strain: Not on file  . Food insecurity    Worry: Not on file    Inability: Not on file  . Transportation needs    Medical: Not on file    Non-medical: Not on file  Tobacco Use  .  Smoking status: Never Smoker  . Smokeless tobacco: Never Used  Substance and Sexual Activity  . Alcohol use: Yes    Comment: social  . Drug use: No  . Sexual activity: Yes  Lifestyle  . Physical activity    Days per week: Not on file    Minutes per session: Not on file  . Stress: Not on file  Relationships  . Social Herbalist on phone: Not on file    Gets together: Not on file    Attends religious service: Not on file    Active member of club or organization: Not on file    Attends meetings of clubs or organizations: Not on file    Relationship status: Not on file  . Intimate partner violence    Fear of current or ex partner: Not on file    Emotionally abused: Not on file    Physically abused: Not on file    Forced sexual activity: Not on file  Other Topics Concern  . Not on file  Social History Narrative   Lives with daughter (grand-daughter)   Daughter- lives in [place]- lives locally   Works at [employer]   Completed HS   No pets   Enjoys walking, mo   vies, Psychologist, clinical, bowling      Patient is right-handed. Her daughter and granddaughter live with her  in a 2 story house. She drinks one cup of coffee a day in the winter months. She drinks tea occasionally. She walks daily, weather permitting.    Past Surgical History:  Procedure Laterality Date  . ABDOMINAL HYSTERECTOMY  11/2005  . bowel reconstruction    . LAPAROSCOPIC SALPINGOOPHERECTOMY  11/2005   right-- assisted with daVinci robot, and lysis of omental adhesions.  Marland Kitchen LAPAROSCOPY  11/2005   total laparoscopy  . OTHER SURGICAL HISTORY     Meckel's diverticulectomy, small bowel resection and resection of retrocecal appendix.    Family History  Problem Relation Age of Onset  . Hypertension Other   . Hypertension Mother   . Hypertension Father   . Cancer Neg Hx   . Colon cancer Neg Hx   . Esophageal cancer Neg Hx   . Rectal cancer Neg Hx   . Stomach cancer Neg Hx   . Pancreatic cancer Neg Hx      No Known Allergies  Current Outpatient Medications on File Prior to Visit  Medication Sig Dispense Refill  . Calcium Carbonate-Vitamin D 600-400 MG-UNIT chew tablet Chew 1 tablet by mouth daily.    . flurbiprofen (ANSAID) 100 MG tablet Take 1 tablet (100 mg total) by mouth as directed. One tablet Q8H PRN. Maximum of 300mg  in a 24 hr period 15 tablet 5  . hydrochlorothiazide (HYDRODIURIL) 25 MG tablet TAKE 1 TABLET (25 MG TOTAL) BY MOUTH DAILY. 90 tablet 1  . lisinopril (ZESTRIL) 20 MG tablet TAKE 1 TABLET (20 MG TOTAL) BY MOUTH DAILY. 90 tablet 1  . naproxen sodium (ALEVE) 220 MG tablet Take 220 mg by mouth as needed.    . nebivolol (BYSTOLIC) 10 MG tablet Take 1 tablet (10 mg total) by mouth daily. 30 tablet 5  . potassium chloride SA (K-DUR) 20 MEQ tablet Take 1 tablet (20 mEq total) by mouth daily. 30 tablet 3  . zolmitriptan (ZOMIG) 5 MG nasal solution Place 1 spray into the nose as needed for migraine. 2 Units 0   No current facility-administered medications on file prior to visit.     BP 139/81 (BP Location: Left Arm, Patient Position: Sitting, Cuff Size: Small)   Pulse (!) 57   Temp (!) 96.6 F (35.9 C) (Temporal)   Resp 16   Ht 5\' 3"  (1.6 m)   Wt 173 lb (78.5 kg)   LMP 11/10/2005   SpO2 98%   BMI 30.65 kg/m       Objective:   Physical Exam  Physical Exam  Constitutional: She is oriented to person, place, and time. She appears well-developed and well-nourished. No distress.  HENT:  Head: Normocephalic and atraumatic.  Right Ear: Tympanic membrane and ear canal normal.  Left Ear: Tympanic membrane and ear canal normal.  Mouth/Throat: Oropharynx is clear and moist.  Eyes: Pupils are equal, round, and reactive to light. No scleral icterus.  Neck: Normal range of motion. No thyromegaly present.  Cardiovascular: Normal rate and regular rhythm.   No murmur heard. Pulmonary/Chest: Effort normal and breath sounds normal. No respiratory distress. He has no wheezes. She  has no rales. She exhibits no tenderness.  Abdominal: Soft. Bowel sounds are normal. She exhibits no distension and no mass. There is no tenderness. There is no rebound and no guarding.  Musculoskeletal: She exhibits no edema.  Lymphadenopathy:    She has no cervical adenopathy.  Neurological: She is alert and oriented to person, place, and time. She has normal patellar reflexes. She exhibits normal  muscle tone. Coordination normal.  Skin: Skin is warm and dry.  Psychiatric: She has a normal mood and affect. Her behavior is normal. Judgment and thought content normal.  Breast/pelvic: deferred          Assessment & Plan:  Preventative care- discussed diet, exercise, weight loss. Obtain routine lab work. She will get shingrix #2 from her employer.  Flu shot and tetanus up to date. Mammo/colo/dexa up to date.   Assessment:

## 2018-12-11 NOTE — Patient Instructions (Signed)
Please complete lab work prior to leaving.    Preventive Care 40-54 Years Old, Female Preventive care refers to visits with your health care provider and lifestyle choices that can promote health and wellness. This includes:  A yearly physical exam. This may also be called an annual well check.  Regular dental visits and eye exams.  Immunizations.  Screening for certain conditions.  Healthy lifestyle choices, such as eating a healthy diet, getting regular exercise, not using drugs or products that contain nicotine and tobacco, and limiting alcohol use. What can I expect for my preventive care visit? Physical exam Your health care provider will check your:  Height and weight. This may be used to calculate body mass index (BMI), which tells if you are at a healthy weight.  Heart rate and blood pressure.  Skin for abnormal spots. Counseling Your health care provider may ask you questions about your:  Alcohol, tobacco, and drug use.  Emotional well-being.  Home and relationship well-being.  Sexual activity.  Eating habits.  Work and work environment.  Method of birth control.  Menstrual cycle.  Pregnancy history. What immunizations do I need?  Influenza (flu) vaccine  This is recommended every year. Tetanus, diphtheria, and pertussis (Tdap) vaccine  You may need a Td booster every 10 years. Varicella (chickenpox) vaccine  You may need this if you have not been vaccinated. Zoster (shingles) vaccine  You may need this after age 60. Measles, mumps, and rubella (MMR) vaccine  You may need at least one dose of MMR if you were born in 1957 or later. You may also need a second dose. Pneumococcal conjugate (PCV13) vaccine  You may need this if you have certain conditions and were not previously vaccinated. Pneumococcal polysaccharide (PPSV23) vaccine  You may need one or two doses if you smoke cigarettes or if you have certain conditions. Meningococcal conjugate  (MenACWY) vaccine  You may need this if you have certain conditions. Hepatitis A vaccine  You may need this if you have certain conditions or if you travel or work in places where you may be exposed to hepatitis A. Hepatitis B vaccine  You may need this if you have certain conditions or if you travel or work in places where you may be exposed to hepatitis B. Haemophilus influenzae type b (Hib) vaccine  You may need this if you have certain conditions. Human papillomavirus (HPV) vaccine  If recommended by your health care provider, you may need three doses over 6 months. You may receive vaccines as individual doses or as more than one vaccine together in one shot (combination vaccines). Talk with your health care provider about the risks and benefits of combination vaccines. What tests do I need? Blood tests  Lipid and cholesterol levels. These may be checked every 5 years, or more frequently if you are over 50 years old.  Hepatitis C test.  Hepatitis B test. Screening  Lung cancer screening. You may have this screening every year starting at age 55 if you have a 30-pack-year history of smoking and currently smoke or have quit within the past 15 years.  Colorectal cancer screening. All adults should have this screening starting at age 50 and continuing until age 75. Your health care provider may recommend screening at age 45 if you are at increased risk. You will have tests every 1-10 years, depending on your results and the type of screening test.  Diabetes screening. This is done by checking your blood sugar (glucose) after you have   not eaten for a while (fasting). You may have this done every 1-3 years.  Mammogram. This may be done every 1-2 years. Talk with your health care provider about when you should start having regular mammograms. This may depend on whether you have a family history of breast cancer.  BRCA-related cancer screening. This may be done if you have a family  history of breast, ovarian, tubal, or peritoneal cancers.  Pelvic exam and Pap test. This may be done every 3 years starting at age 21. Starting at age 30, this may be done every 5 years if you have a Pap test in combination with an HPV test. Other tests  Sexually transmitted disease (STD) testing.  Bone density scan. This is done to screen for osteoporosis. You may have this scan if you are at high risk for osteoporosis. Follow these instructions at home: Eating and drinking  Eat a diet that includes fresh fruits and vegetables, whole grains, lean protein, and low-fat dairy.  Take vitamin and mineral supplements as recommended by your health care provider.  Do not drink alcohol if: ? Your health care provider tells you not to drink. ? You are pregnant, may be pregnant, or are planning to become pregnant.  If you drink alcohol: ? Limit how much you have to 0-1 drink a day. ? Be aware of how much alcohol is in your drink. In the [place] one drink equals one 12 oz bottle of beer (355 mL), one 5 oz glass of wine (148 mL), or one 1 oz glass of hard liquor (44 mL). Lifestyle  Take daily care of your teeth and gums.  Stay active. Exercise for at least 30 minutes on 5 or more days each week.  Do not use any products that contain nicotine or tobacco, such as cigarettes, e-cigarettes, and chewing tobacco. If you need help quitting, ask your health care provider.  If you are sexually active, practice safe sex. Use a condom or other form of birth control (contraception) in order to prevent pregnancy and STIs (sexually transmitted infections).  If told by your health care provider, take low-dose aspirin daily starting at age 50. What's next?  Visit your health care provider once a year for a well check visit.  Ask your health care provider how often you should have your eyes and teeth checked.  Stay up to date on all vaccines. This information is not intended to replace advice given to you  by your health care provider. Make sure you discuss any questions you have with your health care provider. Document Released: 01/23/2015 Document Revised: 09/07/2017 Document Reviewed: 09/07/2017 Elsevier Patient Education  2020 Elsevier Inc.  

## 2018-12-12 ENCOUNTER — Encounter: Payer: Self-pay | Admitting: Family

## 2018-12-12 NOTE — Progress Notes (Signed)
Mailed out to patient 

## 2018-12-31 ENCOUNTER — Encounter: Payer: Self-pay | Admitting: Neurology

## 2019-01-01 NOTE — Progress Notes (Signed)
Virtual Visit via Video Note The purpose of this virtual visit is to provide medical care while limiting exposure to the novel coronavirus.    Consent was obtained for video visit:  Yes.   Answered questions that patient had about telehealth interaction:  Yes.   I discussed the limitations, risks, security and privacy concerns of performing an evaluation and management service by telemedicine. I also discussed with the patient that there may be a patient responsible charge related to this service. The patient expressed understanding and agreed to proceed.  Pt location: Home Physician Location: office Name of referring provider:  Debbrah Alar, NP I connected with [name] at patients initiation/request on 01/02/2019 at  8:50 AM EST by video enabled telemedicine application and verified that I am speaking with the correct person using two identifiers. Pt MRN:  [id number] Pt DOB:  [birth year] Video Participants:  [name]   History of Present Illness:  [name] is a 53 year old right-handed female with hypertension who follows up for migraines.  UPDATE: Intensity: Mild-moderate: 3-5/10; severe: 10/10 Duration: Mild-moderate: 4 hours; severe: Within 30 minutes Frequency: Mild:  3 in past month; severe: 2 in past month. Frequency of abortive medication: Once Rescue protocol: Mild headaches-first-line flurbiprofen or naproxen, second line Zomig NS; severe headaches-Zomig 5 mg NS Current NSAIDS:Flurbiprofen, naproxen Current analgesics:None Current triptans:Zomig 5 mg NS Current ergotamine:None Current anti-emetic:None Current muscle relaxants:None Current anti-anxiolytic:None Current sleep aide:None Current Antihypertensive medications:Lisinopril, Bystolic, HCTZ Current Antidepressant medications:None Current Anticonvulsant medications:None Current anti-CGRP:None Current Vitamins/Herbal/Supplements:None Current Antihistamines/Decongestants:None  Other therapy:None Remote/birth control: None  Caffeine:Rarely coffee or sweet tea Alcohol:No Smoker:No Diet:Hydrates. No soda Exercise:Walks daily Depression:No; Anxiety:No Other pain:No Sleep hygiene:Good  HISTORY:  Onset: In her mid 54s Location:Unilateral either side (retro-orbital to back of head, right greater than left) and into neck Quality:throbbing Initial intensity:Mild-moderate and severe.Shedenies new headache, thunderclap headache or severe headache that wakes from sleep. Aura:no Prodrome:no Postdrome:no Associated symptoms: Conjunctival injection,dizziness, nausea, photophobia, phonophobia, blurred vision. She denies associated unilateral numbness or weakness. Initial duration:Mild-moderate: 1/2 day; Severe: 3 days. Often wakes up with migraine. InitialFrequency:Mild-moderate: 2 days a month; Severe: 1 every 3 months InitialFrequency of abortive medication:2 days a month Triggers: Hot dogs Relieving factors: Heating pad, cold pack or hot shower on neck Activity:Severe aggravates  Past NSAIDS:ibuprofen Past analgesics:Excedrin Migraine, Tylenol Past abortive triptans:Sumatriptan tablet; Relpax 40 mg Past muscle relaxants:Flexeril, Robaxin Past anti-emetic:no Past antihypertensive medications:no Past antidepressant medications:no Past anticonvulsant medications: topiramate (not sure why taken off) Past vitamins/Herbal/Supplements:no Past antihistamines/decongestants:no Other past therapies:no  Family history of headache:no  Past Medical History: Past Medical History:  Diagnosis Date  . Allergy   . Anemia   . Arthritis    left knee  . Headache(784.0)    Chronic migraines--Dr Lewitt  . Hypertension   . Meckel's diverticulum 08/2004   with ischemia  . Myoma 11/2005   right ovary  . Sacroiliac dysfunction 06/23/2012  . Uterine adhesions     Medications: Outpatient Encounter  Medications as of 01/02/2019  Medication Sig  . Calcium Carbonate-Vitamin D 600-400 MG-UNIT chew tablet Chew 1 tablet by mouth daily.  . flurbiprofen (ANSAID) 100 MG tablet Take 1 tablet (100 mg total) by mouth as directed. One tablet Q8H PRN. Maximum of 300mg  in a 24 hr period  . hydrochlorothiazide (HYDRODIURIL) 25 MG tablet TAKE 1 TABLET (25 MG TOTAL) BY MOUTH DAILY.  Marland Kitchen lisinopril (ZESTRIL) 20 MG tablet TAKE 1 TABLET (20 MG TOTAL) BY MOUTH DAILY.  . naproxen sodium (ALEVE)  220 MG tablet Take 220 mg by mouth as needed.  . nebivolol (BYSTOLIC) 10 MG tablet Take 1 tablet (10 mg total) by mouth daily.  . potassium chloride SA (K-DUR) 20 MEQ tablet Take 1 tablet (20 mEq total) by mouth daily.  Marland Kitchen zolmitriptan (ZOMIG) 5 MG nasal solution Place 1 spray into the nose as needed for migraine.   No facility-administered encounter medications on file as of 01/02/2019.    Allergies: No Known Allergies  Family History: Family History  Problem Relation Age of Onset  . Hypertension Other   . Hypertension Mother   . Hypertension Father   . Cancer Neg Hx   . Colon cancer Neg Hx   . Esophageal cancer Neg Hx   . Rectal cancer Neg Hx   . Stomach cancer Neg Hx   . Pancreatic cancer Neg Hx     Social History: Social History   Socioeconomic History  . Marital status: Single    Spouse name: Not on file  . Number of children: 3  . Years of education: Not on file  . Highest education level: 12th grade  Occupational History  . 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 Smoking status: Never Smoker  . Smokeless tobacco: Never Used  Substance and Sexual Activity  . Alcohol use: Yes    Comment: social  . Drug use: No  . Sexual activity: Yes  Other Topics Concern  . Not on file  Social History Narrative   Lives with daughter (grand-daughter)   Daughter- lives in [place]- lives locally   Works at [employer]   Completed HS   No pets   Enjoys walking, mo   vies, Psychologist, clinical,  bowling      Patient is right-handed. Her daughter and granddaughter live with her in a 2 story house. She drinks one cup of coffee a day in the winter months. She drinks tea occasionally. She walks daily, weather permitting.   Social Determinants of Health   Financial Resource Strain:   . Difficulty of Paying Living Expenses: Not on file  Food Insecurity:   . Worried About Charity fundraiser in the Last Year: Not on file  . Ran Out of Food in the Last Year: Not on file  Transportation Needs:   . Lack of Transportation (Medical): Not on file  . Lack of Transportation (Non-Medical): Not on file  Physical Activity:   . Days of Exercise per Week: Not on file  . Minutes of Exercise per Session: Not on file  Stress:   . Feeling of Stress : Not on file  Social Connections:   . Frequency of Communication with Friends and Family: Not on file  . Frequency of Social Gatherings with Friends and Family: Not on file  . Attends Religious Services: Not on file  . Active Member of Clubs or Organizations: Not on file  . Attends Archivist Meetings: Not on file  . Marital Status: Not on file  Intimate Partner Violence:   . Fear of Current or Ex-Partner: Not on file  . Emotionally Abused: Not on file  . Physically Abused: Not on file  . Sexually Abused: Not on file    Observations/Objective:   Height 5\' 3"  (1.6 m), weight 171 lb (77.6 kg), last menstrual period 11/10/2005. No acute distress.  Alert and oriented.  Speech fluent and not dysarthric.  Language intact.  Eyes orthophoric on primary gaze.  Face symmetric.  Assessment and  Plan:   Migraine without aura, without status migrainosus, not intractable  1. Preventative medication not needed. 2.  For abortive therapy, Zomig NS.  She still hasn't received authorization.  Will look into it. 3.  Limit use of pain relievers to no more than 2 days out of week to prevent risk of rebound or medication-overuse headache. 4.  Keep headache  diary 5.  Exercise, hydration, caffeine cessation, sleep hygiene, monitor for and avoid triggers 6.  Consider:  magnesium citrate 400mg  daily, riboflavin 400mg  daily, and coenzyme Q10 100mg  three times daily 7. Follow up 6 months   Follow Up Instructions:    -I discussed the assessment and treatment plan with the patient. The patient was provided an opportunity to ask questions and all were answered. The patient agreed with the plan and demonstrated an understanding of the instructions.   The patient was advised to call back or seek an in-person evaluation if the symptoms worsen or if the condition fails to improve as anticipated.     Dudley Major, DO

## 2019-01-02 ENCOUNTER — Encounter: Payer: Self-pay | Admitting: Neurology

## 2019-01-02 ENCOUNTER — Other Ambulatory Visit: Payer: Self-pay

## 2019-01-02 ENCOUNTER — Telehealth (INDEPENDENT_AMBULATORY_CARE_PROVIDER_SITE_OTHER): Payer: Managed Care, Other (non HMO) | Admitting: Neurology

## 2019-01-02 VITALS — Ht 63.0 in | Wt 171.0 lb

## 2019-01-02 DIAGNOSIS — G43009 Migraine without aura, not intractable, without status migrainosus: Secondary | ICD-10-CM | POA: Diagnosis not present

## 2019-01-07 ENCOUNTER — Ambulatory Visit: Payer: 59 | Admitting: Neurology

## 2019-06-12 ENCOUNTER — Encounter: Payer: Self-pay | Admitting: Family

## 2019-06-12 ENCOUNTER — Other Ambulatory Visit: Payer: Self-pay

## 2019-06-12 ENCOUNTER — Ambulatory Visit: Payer: 59 | Admitting: Family

## 2019-06-12 VITALS — BP 136/80 | HR 57 | Resp 17 | Ht 63.0 in | Wt 172.0 lb

## 2019-06-12 DIAGNOSIS — K219 Gastro-esophageal reflux disease without esophagitis: Secondary | ICD-10-CM | POA: Diagnosis not present

## 2019-06-12 DIAGNOSIS — G43009 Migraine without aura, not intractable, without status migrainosus: Secondary | ICD-10-CM

## 2019-06-12 DIAGNOSIS — E785 Hyperlipidemia, unspecified: Secondary | ICD-10-CM | POA: Diagnosis not present

## 2019-06-12 DIAGNOSIS — I10 Essential (primary) hypertension: Secondary | ICD-10-CM | POA: Diagnosis not present

## 2019-06-12 LAB — LIPID PANEL
Cholesterol: 206 mg/dL — ABNORMAL HIGH (ref 0–200)
HDL: 37.1 mg/dL — ABNORMAL LOW (ref >39.00)
LDL Cholesterol: 143 mg/dL — ABNORMAL HIGH (ref 0–99)
NonHDL: 168.75
Total CHOL/HDL Ratio: 6
Triglycerides: 129 mg/dL (ref 0.0–149.0)
VLDL: 25.8 mg/dL (ref 0.0–40.0)

## 2019-06-12 LAB — BASIC METABOLIC PANEL
BUN: 15 mg/dL (ref 6–23)
CO2: 29 mEq/L (ref 19–32)
Calcium: 9.3 mg/dL (ref 8.4–10.5)
Chloride: 104 mEq/L (ref 96–112)
Creatinine, Ser: 0.79 mg/dL (ref 0.40–1.20)
GFR: 91.57 mL/min (ref >60.00)
Glucose, Bld: 93 mg/dL (ref 70–99)
Potassium: 3.5 mEq/L (ref 3.5–5.1)
Sodium: 138 mEq/L (ref 135–145)

## 2019-06-12 NOTE — Progress Notes (Signed)
Subjective:    Patient ID: [name], female    DOB: 11-28-1965, 54 y.o.   MRN: [id number]  HPI  Patient is a 54 yr old female who presents today for follow up.   HTN- She is maintained on lisinopril, hctz, bystolic.   BP Readings from Last 3 Encounters:  06/12/19 136/80  12/11/18 139/81  09/12/18 127/88   Migraine-  No recent migraines- notes occasional sinus headaches.  Hyperlipidemia- fasting today.  Lab Results  Component Value Date   CHOL 231 (H) 12/11/2018   HDL 36.40 (L) 12/11/2018   LDLCALC 161 (H) 12/11/2018   TRIG 169.0 (H) 12/11/2018   CHOLHDL 6 12/11/2018   GERD- denies any recent gerd symptoms.   Review of Systems See HPI  Past Medical History:  Diagnosis Date  . Allergy   . Anemia   . Arthritis    left knee  . Headache(784.0)    Chronic migraines--Dr Lewitt  . Hypertension   . Meckel's diverticulum 08/2004   with ischemia  . Myoma 11/2005   right ovary  . Sacroiliac dysfunction 06/23/2012  . Uterine adhesions      Social History   Socioeconomic History  . Marital status: Single    Spouse name: Not on file  . Number of children: 3  . Years of education: Not on file  . Highest education level: 12th grade  Occupational History  . 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 Smoking status: Never Smoker  . Smokeless tobacco: Never Used  Substance and Sexual Activity  . Alcohol use: Yes    Comment: social  . Drug use: No  . Sexual activity: Yes  Other Topics Concern  . Not on file  Social History Narrative   Lives with daughter (grand-daughter)   Daughter- lives in [place]- lives locally   Works at [employer]   Completed HS   No pets   Enjoys walking, mo   vies, Psychologist, clinical, bowling      Patient is right-handed. Her daughter and granddaughter live with her in a 2 story house. She drinks one cup of coffee a day in the winter months. She drinks tea occasionally. She walks daily, weather permitting.   Social  Determinants of Health   Financial Resource Strain:   . Difficulty of Paying Living Expenses:   Food Insecurity:   . Worried About Charity fundraiser in the Last Year:   . Arboriculturist in the Last Year:   Transportation Needs:   . Film/video editor (Medical):   Marland Kitchen Lack of Transportation (Non-Medical):   Physical Activity:   . Days of Exercise per Week:   . Minutes of Exercise per Session:   Stress:   . Feeling of Stress :   Social Connections:   . Frequency of Communication with Friends and Family:   . Frequency of Social Gatherings with Friends and Family:   . Attends Religious Services:   . Active Member of Clubs or Organizations:   . Attends Archivist Meetings:   Marland Kitchen Marital Status:   Intimate Partner Violence:   . Fear of Current or Ex-Partner:   . Emotionally Abused:   Marland Kitchen Physically Abused:   . Sexually Abused:     Past Surgical History:  Procedure Laterality Date  . ABDOMINAL HYSTERECTOMY  11/2005  . bowel reconstruction    . LAPAROSCOPIC SALPINGOOPHERECTOMY  11/2005   right-- assisted with daVinci robot, and lysis of  omental adhesions.  Marland Kitchen LAPAROSCOPY  11/2005   total laparoscopy  . OTHER SURGICAL HISTORY     Meckel's diverticulectomy, small bowel resection and resection of retrocecal appendix.    Family History  Problem Relation Age of Onset  . Hypertension Other   . Hypertension Mother   . Hypertension Father   . Cancer Neg Hx   . Colon cancer Neg Hx   . Esophageal cancer Neg Hx   . Rectal cancer Neg Hx   . Stomach cancer Neg Hx   . Pancreatic cancer Neg Hx     No Known Allergies  Current Outpatient Medications on File Prior to Visit  Medication Sig Dispense Refill  . Calcium Carbonate-Vitamin D 600-400 MG-UNIT chew tablet Chew 1 tablet by mouth daily.    . flurbiprofen (ANSAID) 100 MG tablet Take 1 tablet (100 mg total) by mouth as directed. One tablet Q8H PRN. Maximum of 300mg  in a 24 hr period 15 tablet 5  . hydrochlorothiazide  (HYDRODIURIL) 25 MG tablet TAKE 1 TABLET (25 MG TOTAL) BY MOUTH DAILY. 90 tablet 1  . lisinopril (ZESTRIL) 20 MG tablet TAKE 1 TABLET (20 MG TOTAL) BY MOUTH DAILY. 90 tablet 1  . naproxen sodium (ALEVE) 220 MG tablet Take 220 mg by mouth as needed.    . nebivolol (BYSTOLIC) 10 MG tablet Take 1 tablet (10 mg total) by mouth daily. 30 tablet 5  . potassium chloride SA (K-DUR) 20 MEQ tablet Take 1 tablet (20 mEq total) by mouth daily. 30 tablet 3  . zolmitriptan (ZOMIG) 5 MG nasal solution Place 1 spray into the nose as needed for migraine. 2 Units 0   No current facility-administered medications on file prior to visit.    BP 136/80 (BP Location: Left Arm, Patient Position: Sitting, Cuff Size: Large)   Pulse (!) 57   Resp 17   Ht 5\' 3"  (1.6 m)   Wt 172 lb (78 kg)   LMP 11/10/2005   SpO2 98%   BMI 30.47 kg/m       Objective:   Physical Exam Constitutional:      Appearance: She is well-developed.  Neck:     Thyroid: No thyromegaly.  Cardiovascular:     Rate and Rhythm: Normal rate and regular rhythm.     Heart sounds: Normal heart sounds. No murmur.  Pulmonary:     Effort: Pulmonary effort is normal. No respiratory distress.     Breath sounds: Normal breath sounds. No wheezing.  Musculoskeletal:     Cervical back: Neck supple.  Skin:    General: Skin is warm and dry.  Neurological:     Mental Status: She is alert and oriented to person, place, and time.  Psychiatric:        Behavior: Behavior normal.        Thought Content: Thought content normal.        Judgment: Judgment normal.           Assessment & Plan:  HTN- blood pressure is stable. Continue current meds. Obtain follow up bmet.   Migraine- stable- management per neurology.  Hyperlipidemia- obtain follow up lipid panel.  GERD- stable without medication.  Monitor.  This visit occurred during the SARS-CoV-2 public health emergency.  Safety protocols were in place, including screening questions prior to the  visit, additional usage of staff PPE, and extensive cleaning of exam room while observing appropriate contact time as indicated for disinfecting solutions.

## 2019-06-17 NOTE — Progress Notes (Signed)
Mailed out to pt 

## 2019-06-28 ENCOUNTER — Other Ambulatory Visit: Payer: Self-pay | Admitting: Family

## 2019-07-02 NOTE — Progress Notes (Signed)
NEUROLOGY FOLLOW UP OFFICE NOTE  [name] [id number]  HISTORY OF PRESENT ILLNESS: [name] a 54 year old right-handed female with hypertension who follows up for migraines.  UPDATE: Still unable to get Zomig NS approved.  Not taking flurbiprofen.  Right now slight headache behind left eye radiating to back of head and neck.   Mild to severe. Over past month, 2 severe (lasting 2 days) and 2 mild (lasting a few hours).  Without Zomig.  Naproxen ineffective. Rescue protocol: Mild headaches-first-line flurbiprofen or naproxen, second line Zomig NS; severe headaches-Zomig 5 mg NS Current NSAIDS:Flurbiprofen (has not used), naproxen Current analgesics:None Current triptans:Zomig 5 mg NS Current ergotamine:None Current anti-emetic:None Current muscle relaxants:None Current anti-anxiolytic:None Current sleep aide:None Current Antihypertensive medications:Lisinopril, Bystolic, HCTZ Current Antidepressant medications:None Current Anticonvulsant medications:None Current anti-CGRP:None Current Vitamins/Herbal/Supplements:None Current Antihistamines/Decongestants:None Other therapy:None Remote/birth control: None  Caffeine:Rarely coffee or sweet tea Alcohol:No Smoker:No Diet:Hydrates. No soda Exercise:Walks daily Depression:No; Anxiety:No Other pain:No Sleep hygiene:Good  HISTORY:  Onset: In her mid 55s Location:Unilateral either side (retro-orbital to back of head, right greater than left) and into neck Quality:throbbing Initial intensity:Mild-moderate and severe.Shedenies new headache, thunderclap headache or severe headache that wakes from sleep. Aura:no Prodrome:no Postdrome:no Associated symptoms: Conjunctival injection,dizziness, nausea, photophobia, phonophobia, blurred vision. She denies associated unilateral numbness or weakness. Initial duration:Mild-moderate: 1/2 day; Severe: 3 days. Often wakes up with  migraine. InitialFrequency:Mild-moderate: 2 days a month; Severe: 1 every 3 months InitialFrequency of abortive medication:2 days a month Triggers: Hot dogs Relieving factors: Heating pad, cold pack or hot shower on neck Activity:Severe aggravates  Past NSAIDS:ibuprofen Past analgesics:Excedrin Migraine, Tylenol Past abortive triptans:Sumatriptan tablet; Relpax 40 mg Past muscle relaxants:Flexeril, Robaxin Past anti-emetic:no Past antihypertensive medications:no Past antidepressant medications:no Past anticonvulsant medications: topiramate (not sure why taken off) Past vitamins/Herbal/Supplements:no Past antihistamines/decongestants:no Other past therapies:no  Family history of headache:no  PAST MEDICAL HISTORY: Past Medical History:  Diagnosis Date  . Allergy   . Anemia   . Arthritis    left knee  . Headache(784.0)    Chronic migraines--Dr Lewitt  . Hypertension   . Meckel's diverticulum 08/2004   with ischemia  . Myoma 11/2005   right ovary  . Sacroiliac dysfunction 06/23/2012  . Uterine adhesions     MEDICATIONS: Current Outpatient Medications on File Prior to Visit  Medication Sig Dispense Refill  . Calcium Carbonate-Vitamin D 600-400 MG-UNIT chew tablet Chew 1 tablet by mouth daily.    . flurbiprofen (ANSAID) 100 MG tablet Take 1 tablet (100 mg total) by mouth as directed. One tablet Q8H PRN. Maximum of 300mg  in a 24 hr period 15 tablet 5  . hydrochlorothiazide (HYDRODIURIL) 25 MG tablet TAKE 1 TABLET (25 MG TOTAL) BY MOUTH DAILY. 90 tablet 1  . lisinopril (ZESTRIL) 20 MG tablet TAKE ONE TABLET BY MOUTH ONE TIME DAILY 90 tablet 0  . naproxen sodium (ALEVE) 220 MG tablet Take 220 mg by mouth as needed.    . nebivolol (BYSTOLIC) 10 MG tablet Take 1 tablet (10 mg total) by mouth daily. 30 tablet 5  . potassium chloride SA (K-DUR) 20 MEQ tablet Take 1 tablet (20 mEq total) by mouth daily. 30 tablet 3  . zolmitriptan (ZOMIG) 5 MG nasal  solution Place 1 spray into the nose as needed for migraine. 2 Units 0   No current facility-administered medications on file prior to visit.    ALLERGIES: No Known Allergies  FAMILY HISTORY: Family History  Problem Relation Age of Onset  . Hypertension Other   .  Hypertension Mother   . Hypertension Father   . Cancer Neg Hx   . Colon cancer Neg Hx   . Esophageal cancer Neg Hx   . Rectal cancer Neg Hx   . Stomach cancer Neg Hx   . Pancreatic cancer Neg Hx     SOCIAL HISTORY: Social History   Socioeconomic History  . Marital status: Single    Spouse name: Not on file  . Number of children: 3  . Years of education: Not on file  . Highest education level: 12th grade  Occupational History  . 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 Smoking status: Never Smoker  . Smokeless tobacco: Never Used  Substance and Sexual Activity  . Alcohol use: Yes    Comment: social  . Drug use: No  . Sexual activity: Yes  Other Topics Concern  . Not on file  Social History Narrative   Lives with daughter (grand-daughter)   Daughter- lives in [place]- lives locally   Works at [employer]   Completed HS   No pets   Enjoys walking, mo   vies, Psychologist, clinical, bowling      Patient is right-handed. Her daughter and granddaughter live with her in a 2 story house. She drinks one cup of coffee a day in the winter months. She drinks tea occasionally. She walks daily, weather permitting.   Social Determinants of Health   Financial Resource Strain:   . Difficulty of Paying Living Expenses:   Food Insecurity:   . Worried About Charity fundraiser in the Last Year:   . Arboriculturist in the Last Year:   Transportation Needs:   . Film/video editor (Medical):   Marland Kitchen Lack of Transportation (Non-Medical):   Physical Activity:   . Days of Exercise per Week:   . Minutes of Exercise per Session:   Stress:   . Feeling of Stress :   Social Connections:   . Frequency of  Communication with Friends and Family:   . Frequency of Social Gatherings with Friends and Family:   . Attends Religious Services:   . Active Member of Clubs or Organizations:   . Attends Archivist Meetings:   Marland Kitchen Marital Status:   Intimate Partner Violence:   . Fear of Current or Ex-Partner:   . Emotionally Abused:   Marland Kitchen Physically Abused:   . Sexually Abused:     PHYSICAL EXAM: Blood pressure (!) 160/88, pulse 65, height 5\' 3"  (1.6 m), weight 173 lb 9.6 oz (78.7 kg), last menstrual period 11/10/2005, SpO2 94 %. General: No acute distress.  Patient appears well-groomed.   Head:  Normocephalic/atraumatic Eyes:  Fundi examined but not visualized Neck: supple, no paraspinal tenderness, full range of motion Heart:  Regular rate and rhythm Lungs:  Clear to auscultation bilaterally Back: No paraspinal tenderness Neurological Exam: alert and oriented to person, place, and time. Attention span and concentration intact, recent and remote memory intact, fund of knowledge intact.  Speech fluent and not dysarthric, language intact.  CN II-XII intact. Bulk and tone normal, muscle strength 5/5 throughout.  Sensation to light touch, temperature and vibration intact.  Deep tendon reflexes 2+ throughout, toes downgoing.  Finger to nose and heel to shin testing intact.  Gait normal, Romberg negative.  IMPRESSION: Migraine without aura, without status migrainosus, not intractable  PLAN: 1. Start Aimovig 70mg  every 28 days  2. For abortive therapy, will try Nurtec 3.  Limit use  of pain relievers to no more than 2 days out of week to prevent risk of rebound or medication-overuse headache. 4.  Keep headache diary 5.  Exercise, hydration, caffeine cessation, sleep hygiene, monitor for and avoid triggers 6.  Follow up 4 months   Metta Clines, DO  CC:  Debbrah Alar, NP

## 2019-07-04 ENCOUNTER — Other Ambulatory Visit: Payer: Self-pay

## 2019-07-04 ENCOUNTER — Encounter: Payer: Self-pay | Admitting: Neurology

## 2019-07-04 ENCOUNTER — Ambulatory Visit: Payer: 59 | Admitting: Neurology

## 2019-07-04 VITALS — BP 160/88 | HR 65 | Ht 63.0 in | Wt 173.6 lb

## 2019-07-04 DIAGNOSIS — G43009 Migraine without aura, not intractable, without status migrainosus: Secondary | ICD-10-CM | POA: Diagnosis not present

## 2019-07-04 MED ORDER — AIMOVIG 70 MG/ML ~~LOC~~ SOAJ: 70.0000 mg | SUBCUTANEOUS | 11 refills | Status: DC

## 2019-07-04 NOTE — Patient Instructions (Signed)
  1. Start Aimovig 70mg  injection every 28 days.  2. Take Nurtec 75mg  at earliest onset of headache.  Maximum 1 tablet in 24 hours. 3. Limit use of pain relievers to no more than 2 days out of the week.  These medications include acetaminophen, NSAIDs (ibuprofen/Advil/Motrin, naproxen/Aleve, triptans (Imitrex/sumatriptan), Excedrin, and narcotics.  This will help reduce risk of rebound headaches. 4. Be aware of common food triggers:  - Caffeine:  coffee, black tea, cola, Mt. Dew  - Chocolate  - Dairy:  aged cheeses (brie, blue, cheddar, gouda, [place], provolone, [place], Swiss, etc), chocolate milk, buttermilk, sour cream, limit eggs and yogurt  - Nuts, peanut butter  - Alcohol  - Cereals/grains:  FRESH breads (fresh bagels, sourdough, doughnuts), yeast productions  - Processed/canned/aged/cured meats (pre-packaged deli meats, hotdogs)  - MSG/glutamate:  soy sauce, flavor enhancer, pickled/preserved/marinated foods  - Sweeteners:  aspartame (Equal, Nutrasweet).  Sugar and Splenda are okay  - Vegetables:  legumes (lima beans, lentils, snow peas, fava beans, pinto peans, peas, garbanzo beans), sauerkraut, onions, olives, pickles  - Fruit:  avocados, bananas, citrus fruit (orange, lemon, grapefruit), mango  - Other:  Frozen meals, macaroni and cheese 5. Routine exercise 6. Stay adequately hydrated (aim for 64 oz water daily) 7. Keep headache diary 8. Maintain proper stress management 9. Maintain proper sleep hygiene 10. Do not skip meals 11. Consider supplements:  magnesium citrate 400mg  daily, riboflavin 400mg  daily, coenzyme Q10 100mg  three times daily.

## 2019-07-04 NOTE — Progress Notes (Addendum)
Christel Bai (Key: [facility]) Rx #: [id number] Aimovig 70MG /ML auto-injectors   Form [employer] ePA Form (2017 NCPDP) Created 4 days ago Sent to Plan 4 days ago Plan Response 4 days ago Submit Clinical Questions 4 days ago Determination Favorable 16 hours ago Message from [place] request has been approved

## 2019-08-26 NOTE — Progress Notes (Signed)
[name] (Key: [id number]) Rx #: [id number] Aimovig 70MG /ML auto-injectors   Form [employer] ePA Form (2017 NCPDP) Created 2 days ago Sent to Plan less than a minute ago Plan Response less than a minute ago Submit Clinical Questions Determination N/A Message from Plan An active authorization already exists for this patient. Proceed with prescribing the Medication or call Pharmacy Customer Care at [phone]. MPA: [id number] Effective: 07/07/2019 12:00:00 AM Terminate: 01/06/2020 12:00:00 AM

## 2019-09-29 ENCOUNTER — Other Ambulatory Visit: Payer: Self-pay | Admitting: Family

## 2019-10-23 ENCOUNTER — Other Ambulatory Visit: Payer: Self-pay | Admitting: Family

## 2019-10-25 MED ORDER — NEBIVOLOL HCL 10 MG PO TABS: 10.0000 mg | ORAL_TABLET | Freq: Every day | ORAL | 0 refills | Status: DC

## 2019-10-25 NOTE — Addendum Note (Signed)
Addended by: Debbrah Alar on: 10/25/2019 12:30 PM   Modules accepted: Orders

## 2019-11-01 ENCOUNTER — Other Ambulatory Visit: Payer: Self-pay | Admitting: Family

## 2019-11-26 NOTE — Progress Notes (Signed)
NEUROLOGY FOLLOW UP OFFICE NOTE  [name] [id number]   Subjective:  [name] a 54 year old right-handed female with hypertension who follows up for migranes.  UPDATE: Started Aimovig in June.  Nurtec was helpful.    Intensity:  mild Duration:  A couple of hours with naproxen. Frequency:  1 in past 30 days. Current NSAIDS:naproxen Current analgesics:None Current triptans:none Current ergotamine:None Current anti-emetic:None Current muscle relaxants:None Current anti-anxiolytic:None Current sleep aide:None Current Antihypertensive medications:Lisinopril, Bystolic, HCTZ Current Antidepressant medications:None Current Anticonvulsant medications:None Current anti-CGRP:Aimovig 70mg  every 28 days; Nurtec rescue Current Vitamins/Herbal/Supplements:None Current Antihistamines/Decongestants:None Other therapy:None Remote/birth control: None  Caffeine:Rarely coffee or sweet tea Alcohol:No Smoker:No Diet:Hydrates. No soda Exercise:Walks daily Depression:No; Anxiety:No Other pain:No Sleep hygiene:Good  HISTORY: Onset: In her mid 36s Location:Unilateral either side (retro-orbital to back of head, right greater than left) and into neck Quality:throbbing Initial intensity:Mild-moderate and severe.Shedenies new headache, thunderclap headache or severe headache that wakes from sleep. Aura:no Prodrome:no Postdrome:no Associated symptoms: Conjunctival injection,dizziness, nausea, photophobia, phonophobia, blurred vision. She denies associated unilateral numbness or weakness. Initial duration:Mild-moderate: 1/2 day; Severe: 3 days. Often wakes up with migraine. InitialFrequency:Mild-moderate: 2 days a month; Severe: 1 every 3 months InitialFrequency of abortive medication:2 days a month Triggers: Hot dogs Relieving factors: Heating pad, cold pack or hot shower on neck Activity:Severe aggravates  Past  NSAIDS:ibuprofen Past analgesics:Excedrin Migraine, Tylenol Past abortive triptans:Sumatriptan tablet; Relpax 40 mg, Zomig NS (not covered by insurance) Past muscle relaxants:Flexeril, Robaxin Past anti-emetic:no Past antihypertensive medications:no Past antidepressant medications:no Past anticonvulsant medications: topiramate (not sure why taken off) Past vitamins/Herbal/Supplements:no Past antihistamines/decongestants:no Other past therapies:no  Family history of headache:no  PAST MEDICAL HISTORY: Past Medical History:  Diagnosis Date  . Allergy   . Anemia   . Arthritis    left knee  . Headache(784.0)    Chronic migraines--Dr Lewitt  . Hypertension   . Meckel's diverticulum 08/2004   with ischemia  . Myoma 11/2005   right ovary  . Sacroiliac dysfunction 06/23/2012  . Uterine adhesions     MEDICATIONS: Current Outpatient Medications on File Prior to Visit  Medication Sig Dispense Refill  . Calcium Carbonate-Vitamin D 600-400 MG-UNIT chew tablet Chew 1 tablet by mouth daily.    Eduard Roux (AIMOVIG) 70 MG/ML SOAJ Inject 70 mg into the skin every 28 (twenty-eight) days. 1 pen 11  . flurbiprofen (ANSAID) 100 MG tablet Take 1 tablet (100 mg total) by mouth as directed. One tablet Q8H PRN. Maximum of 300mg  in a 24 hr period (Patient not taking: Reported on 07/04/2019) 15 tablet 5  . hydrochlorothiazide (HYDRODIURIL) 25 MG tablet Take 1 tablet (25 mg total) by mouth daily. 90 tablet 0  . lisinopril (ZESTRIL) 20 MG tablet TAKE ONE TABLET BY MOUTH ONE TIME DAILY 90 tablet 0  . naproxen sodium (ALEVE) 220 MG tablet Take 220 mg by mouth as needed.    . nebivolol (BYSTOLIC) 10 MG tablet Take 1 tablet (10 mg total) by mouth daily. 90 tablet 0  . potassium chloride SA (K-DUR) 20 MEQ tablet Take 1 tablet (20 mEq total) by mouth daily. (Patient not taking: Reported on 07/04/2019) 30 tablet 3  . zolmitriptan (ZOMIG) 5 MG nasal solution Place 1 spray into the nose  as needed for migraine. 2 Units 0   No current facility-administered medications on file prior to visit.    ALLERGIES: No Known Allergies  FAMILY HISTORY: Family History  Problem Relation Age of Onset  . Hypertension Other   . Hypertension Mother   .  Hypertension Father   . Cancer Neg Hx   . Colon cancer Neg Hx   . Esophageal cancer Neg Hx   . Rectal cancer Neg Hx   . Stomach cancer Neg Hx   . Pancreatic cancer Neg Hx     SOCIAL HISTORY: Social History   Socioeconomic History  . Marital status: Single    Spouse name: Not on file  . Number of children: 3  . Years of education: Not on file  . Highest education level: 12th grade  Occupational History  . 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 Smoking status: Never Smoker  . Smokeless tobacco: Never Used  Substance and Sexual Activity  . Alcohol use: Yes    Comment: social  . Drug use: No  . Sexual activity: Yes  Other Topics Concern  . Not on file  Social History Narrative   Lives with daughter (grand-daughter)   Daughter- lives in [place]- lives locally   Works at [employer]   Completed HS   No pets   Enjoys walking, mo   vies, Psychologist, clinical, bowling      Patient is right-handed. Her daughter and granddaughter live with her in a 2 story house. She drinks one cup of coffee a day in the winter months. She drinks tea occasionally. She walks daily, weather permitting.   Social Determinants of Health   Financial Resource Strain:   . Difficulty of Paying Living Expenses: Not on file  Food Insecurity:   . Worried About Charity fundraiser in the Last Year: Not on file  . Ran Out of Food in the Last Year: Not on file  Transportation Needs:   . Lack of Transportation (Medical): Not on file  . Lack of Transportation (Non-Medical): Not on file  Physical Activity:   . Days of Exercise per Week: Not on file  . Minutes of Exercise per Session: Not on file  Stress:   . Feeling of Stress : Not on file    Social Connections:   . Frequency of Communication with Friends and Family: Not on file  . Frequency of Social Gatherings with Friends and Family: Not on file  . Attends Religious Services: Not on file  . Active Member of Clubs or Organizations: Not on file  . Attends Archivist Meetings: Not on file  . Marital Status: Not on file  Intimate Partner Violence:   . Fear of Current or Ex-Partner: Not on file  . Emotionally Abused: Not on file  . Physically Abused: Not on file  . Sexually Abused: Not on file     Objective:  Blood pressure (!) 147/70, pulse 64, height 5\' 3"  (1.6 m), weight 168 lb 3.2 oz (76.3 kg), last menstrual period 11/10/2005, SpO2 97 %. General: No acute distress.  Patient appears well-groomed.     Assessment/Plan:   Migraine without aura, without status migrainosus, not intractable  1.  Migraine prevention:  Aimovig 70mg  every 28 days 2.  Migraine rescue:  Nurtec 75mg  PRN 3.  Limit use of pain relievers to no more than 2 days out of week to prevent risk of rebound or medication-overuse headache. 4. Keep headache diary 5.  Follow up 6 months.  Metta Clines, DO  CC: Debbrah Alar, NP

## 2019-11-27 ENCOUNTER — Ambulatory Visit: Payer: 59 | Admitting: Neurology

## 2019-11-27 ENCOUNTER — Encounter: Payer: Self-pay | Admitting: Neurology

## 2019-11-27 ENCOUNTER — Other Ambulatory Visit: Payer: Self-pay

## 2019-11-27 VITALS — BP 147/70 | HR 64 | Ht 63.0 in | Wt 168.2 lb

## 2019-11-27 DIAGNOSIS — G43009 Migraine without aura, not intractable, without status migrainosus: Secondary | ICD-10-CM

## 2019-11-27 MED ORDER — NURTEC 75 MG PO TBDP: 75.0000 mg | ORAL_TABLET | Freq: Every day | ORAL | 5 refills | Status: DC | PRN

## 2019-11-27 NOTE — Patient Instructions (Signed)
  1. Continue Aimovig 70mg  every 28 days 2. Take Nurtec 1 tablet as needed for migraine.  Maximum 1 tablet in 24 hours.  Alternatively, you may take naproxen. 3. Limit use of pain relievers to no more than 2 days out of the week.  These medications include acetaminophen, NSAIDs (ibuprofen/Advil/Motrin, naproxen/Aleve, triptans (Imitrex/sumatriptan), Excedrin, and narcotics.  This will help reduce risk of rebound headaches. 4. Be aware of common food triggers:  - Caffeine:  coffee, black tea, cola, Mt. Dew  - Chocolate  - Dairy:  aged cheeses (brie, blue, cheddar, gouda, [place], provolone, [place], Swiss, etc), chocolate milk, buttermilk, sour cream, limit eggs and yogurt  - Nuts, peanut butter  - Alcohol  - Cereals/grains:  FRESH breads (fresh bagels, sourdough, doughnuts), yeast productions  - Processed/canned/aged/cured meats (pre-packaged deli meats, hotdogs)  - MSG/glutamate:  soy sauce, flavor enhancer, pickled/preserved/marinated foods  - Sweeteners:  aspartame (Equal, Nutrasweet).  Sugar and Splenda are okay  - Vegetables:  legumes (lima beans, lentils, snow peas, fava beans, pinto peans, peas, garbanzo beans), sauerkraut, onions, olives, pickles  - Fruit:  avocados, bananas, citrus fruit (orange, lemon, grapefruit), mango  - Other:  Frozen meals, macaroni and cheese 5. Routine exercise 6. Stay adequately hydrated (aim for 64 oz water daily) 7. Keep headache diary 8. Maintain proper stress management 9. Maintain proper sleep hygiene 10. Do not skip meals 11. Consider supplements:  magnesium citrate 400mg  daily, riboflavin 400mg  daily, coenzyme Q10 100mg  three times daily.

## 2019-11-27 NOTE — Progress Notes (Signed)
Besse Miron (Key: [id number]) Rx #: [id number] Nurtec 75MG  dispersible tablets   Form [employer] ePA Form (2017 NCPDP) Created 14 minutes ago Sent to Plan 5 minutes ago Plan Response 5 minutes ago Submit Clinical Questions less than a minute ago Determination Favorable less than a minute ago Message from [place] request has been approved

## 2019-12-18 ENCOUNTER — Encounter: Payer: 59 | Admitting: Family

## 2019-12-25 ENCOUNTER — Encounter: Payer: Self-pay | Admitting: Neurology

## 2019-12-25 NOTE — Progress Notes (Signed)
[name] (Key: [id number]) Aimovig 70MG /ML auto-injectors   Form [employer] ePA Form (2017 NCPDP) Created 2 days ago Sent to Plan 1 day ago Plan Response 1 day ago Submit Clinical Questions 1 day ago Determination Favorable 1 hour ago Message from Plan Your request has been approved Valid from 12/25/19 to lifetime

## 2020-01-02 ENCOUNTER — Encounter: Payer: Self-pay | Admitting: Family Medicine

## 2020-01-02 ENCOUNTER — Other Ambulatory Visit: Payer: Self-pay

## 2020-01-02 ENCOUNTER — Ambulatory Visit: Payer: 59 | Admitting: Family Medicine

## 2020-01-02 VITALS — BP 125/42 | HR 66 | Temp 98.3°F | Resp 16 | Ht 63.0 in | Wt 167.0 lb

## 2020-01-02 DIAGNOSIS — M25531 Pain in right wrist: Secondary | ICD-10-CM

## 2020-01-02 MED ORDER — MELOXICAM 15 MG PO TABS: 15.0000 mg | ORAL_TABLET | Freq: Every day | ORAL | 0 refills | Status: DC

## 2020-01-02 NOTE — Progress Notes (Signed)
Musculoskeletal Exam  Patient: [name] DOB: Jun 08, 1965  DOS: 01/02/2020  SUBJECTIVE:  Chief Complaint:   Chief Complaint  Patient presents with  . Wrist Pain    Right , onset: 1 week .     [name] is a 54 y.o.  female for evaluation and treatment of R wrist pain.   Onset:  1 week ago. Was picking up a box at work.  Location: Thumb side of wrist and radiating up forearm Character:  aching and sharp  Progression of issue:  has worsened Associated symptoms: decreased ROM from pain; no redness, bruising, swelling Treatment: to date has been splinting, elevation.   Neurovascular symptoms: no  Past Medical History:  Diagnosis Date  . Allergy   . Anemia   . Arthritis    left knee  . Headache(784.0)    Chronic migraines--Dr Lewitt  . Hypertension   . Meckel's diverticulum 08/2004   with ischemia  . Myoma 11/2005   right ovary  . Sacroiliac dysfunction 06/23/2012  . Uterine adhesions     Objective: VITAL SIGNS: BP (!) 125/42   Pulse 66   Temp 98.3 F (36.8 C) (Oral)   Resp 16   Ht 5\' 3"  (1.6 m)   Wt 167 lb (75.8 kg)   LMP 11/10/2005   SpO2 98%   BMI 29.58 kg/m  Constitutional: Well formed, well developed. No acute distress. Thorax & Lungs: No accessory muscle use Musculoskeletal: R wrist.   Normal active range of motion: yes.   Normal passive range of motion: yes Tenderness to palpation: yes over extensor pollicis tendon and flexor tendon at wrist Deformity: no Ecchymosis: no Tests positive: Finkelstein's elicited pain over opposite thumb extensor Tests negative: none Neurologic: Normal sensory function. Psychiatric: Normal mood. Age appropriate judgment and insight. Alert & oriented x 3.    Assessment:  Right wrist pain - Plan: meloxicam (MOBIC) 15 MG tablet  Plan: Stretches/exercises, heat, ice, Tylenol. Brace to wear at night and during aggravating activities. She works as a Clinical research associate at [employer], Quarry manager given for 10 lb lifting restriction over  next 2 weeks.  F/u prn. The patient voiced understanding and agreement to the plan.   [place], DO 01/02/20  9:50 AM

## 2020-01-02 NOTE — Patient Instructions (Addendum)
Ice/cold pack over area for 10-15 min twice daily.  Heat (pad or rice pillow in microwave) over affected area, 10-15 minutes twice daily.   OK to take Tylenol 1000 mg (2 extra strength tabs) or 975 mg (3 regular strength tabs) every 6 hours as needed.  Wear the brace at night and if you need to lift or do anything aggravating.   Let us know if you need anything.   Wrist and Forearm Exercises Do exercises exactly as told by your health care provider and adjust them as directed. It is normal to feel mild stretching, pulling, tightness, or discomfort as you do these exercises, but you should stop right away if you feel sudden pain or your pain gets worse.   RANGE OF MOTION EXERCISES These exercises warm up your muscles and joints and improve the movement and flexibility of your injured wrist and forearm. These exercises also help to relieve pain, numbness, and tingling. These exercises are done using the muscles in your injured wrist and forearm. Exercise A: Wrist Flexion, Active 1. With your fingers relaxed, bend your wrist forward as far as you can. 2. Hold this position for 30 seconds. Repeat 2 times. Complete this exercise 3 times per week. Exercise B: Wrist Extension, Active 1. With your fingers relaxed, bend your wrist backward as far as you can. 2. Hold this position for 30 seconds. Repeat 2 times. Complete this exercise 3 times per week. Exercise C: Supination, Active  1. Stand or sit with your arms at your sides. 2. Bend your left / right elbow to an "L" shape (90 degrees). 3. Turn your palm upward until you feel a gentle stretch on the inside of your forearm. 4. Hold this position for 30 seconds. 5. Slowly return your palm to the starting position. Repeat 2 times. Complete this exercise 3 times per week. Exercise D: Pronation, Active  1. Stand or sit with your arms at your sides. 2. Bend your left / right elbow to an "L" shape (90 degrees). 3. Turn your palm downward until  you feel a gentle stretch on the top of your forearm. 4. Hold this position for 30 seconds. 5. Slowly return your palm to the starting position. Repeat 2 times. Complete this exercise once a day.  STRETCHING EXERCISES These exercises warm up your muscles and joints and improve the movement and flexibility of your injured wrist and forearm. These exercises also help to relieve pain, numbness, and tingling. These exercises are done using your healthy wrist and forearm to help stretch the muscles in your injured wrist and forearm. Exercise E: Wrist Flexion, Passive  1. Extend your left / right arm in front of you, relax your wrist, and point your fingers downward. 2. Gently push on the back of your hand. Stop when you feel a gentle stretch on the top of your forearm. 3. Hold this position for 30 seconds. Repeat 2 times. Complete this exercise 3 times per week. Exercise F: Wrist Extension, Passive  1. Extend your left / right arm in front of you and turn your palm upward. 2. Gently pull your palm and fingertips back so your fingers point downward. You should feel a gentle stretch on the palm-side of your forearm. 3. Hold this position for 30 seconds. Repeat 2 times. Complete this exercise 3 times per week. Exercise G: Forearm Rotation, Supination, Passive 1. Sit with your left / right elbow bent to an "L" shape (90 degrees) with your forearm resting on a table. 2.  Keeping your upper body and shoulder still, use your other hand to rotate your forearm palm-up until you feel a gentle to moderate stretch. 3. Hold this position for 30 seconds. 4. Slowly release the stretch and return to the starting position. Repeat 2 times. Complete this exercise 3 times per week. Exercise H: Forearm Rotation, Pronation, Passive 1. Sit with your left / right elbow bent to an "L" shape (90 degrees) with your forearm resting on a table. 2. Keeping your upper body and shoulder still, use your other hand to rotate  your forearm palm-down until you feel a gentle to moderate stretch. 3. Hold this position for 30 seconds. 4. Slowly release the stretch and return to the starting position. Repeat 2 times. Complete this exercise 3 times per week.  STRENGTHENING EXERCISES These exercises build strength and endurance in your wrist and forearm. Endurance is the ability to use your muscles for a long time, even after they get tired. Exercise I: Wrist Flexors  1. Sit with your left / right forearm supported on a table and your hand resting palm-up over the edge of the table. Your elbow should be bent to an "L" shape (about 90 degrees) and be below the level of your shoulder. 2. Hold a 3-5 lb weight in your left / right hand. Or, hold a rubber exercise band or tube in both hands, keeping your hands at the same level and hip distance apart. There should be a slight tension in the exercise band or tube. 3. Slowly curl your hand up toward your forearm. 4. Hold this position for 3 seconds. 5. Slowly lower your hand back to the starting position. Repeat 2 times. Complete this exercise 3 times per week. Exercise J: Wrist Extensors  1. Sit with your left / right forearm supported on a table and your hand resting palm-down over the edge of the table. Your elbow should be bent to an "L" shape (about 90 degrees) and be below the level of your shoulder. 2. Hold a 3-5 lb weight in your left / right hand. Or, hold a rubber exercise band or tube in both hands, keeping your hands at the same level and hip distance apart. There should be a slight tension in the exercise band or tube. 3. Slowly curl your hand up toward your forearm. 4. Hold this position for 3 seconds. 5. Slowly lower your hand back to the starting position. Repeat 2 times. Complete this exercise 3 times per week. Exercise K: Forearm Rotation, Supination  1. Sit with your left / right forearm supported on a table and your hand resting palm-down. Your elbow should  be at your side, bent to an "L" shape (about 90 degrees), and below the level of your shoulder. Keep your wrist stable and in a neutral position throughout the exercise. 2. Gently hold a lightweight hammer with your left / right hand. 3. Without moving your elbow or wrist, slowly rotate your palm upward to a thumbs-up position. 4. Hold this position for 3 seconds. 5. Slowly return your forearm to the starting position. Repeat 2 times. Complete this exercise 3 times per week. Exercise L: Forearm Rotation, Pronation  1. Sit with your left / right forearm supported on a table and your hand resting palm-up. Your elbow should be at your side, bent to an "L" shape (about 90 degrees), and below the level of your shoulder. Keep your wrist stable. Do not allow it to move backward or forward during the exercise. 2. Gently  hold a lightweight hammer with your left / right hand. 3. Without moving your elbow or wrist, slowly rotate your palm and hand upward to a thumbs-up position. 4. Hold this position for 3 seconds. 5. Slowly return your forearm to the starting position. Repeat 2 times. Complete this exercise 3 times per week. Exercise M: Grip Strengthening  1. Hold one of these items in your left / right hand: play dough, therapy putty, a dense sponge, a stress ball, or a large, rolled sock. 2. Squeeze as hard as you can without increasing pain. 3. Hold this position for 5 seconds. 4. Slowly release your grip. Repeat 2 times. Complete this exercise 3 times per week.  This information is not intended to replace advice given to you by your health care provider. Make sure you discuss any questions you have with your health care provider. Document Released: 11/10/2004 Document Revised: 09/21/2015 Document Reviewed: 09/21/2014 Elsevier Interactive Patient Education  [employer].

## 2020-01-15 ENCOUNTER — Encounter: Payer: Self-pay | Admitting: Family

## 2020-01-15 ENCOUNTER — Other Ambulatory Visit: Payer: Self-pay

## 2020-01-15 ENCOUNTER — Ambulatory Visit (INDEPENDENT_AMBULATORY_CARE_PROVIDER_SITE_OTHER): Payer: 59 | Admitting: Family

## 2020-01-15 VITALS — BP 122/82 | HR 53 | Resp 16 | Ht 63.0 in | Wt 160.0 lb

## 2020-01-15 DIAGNOSIS — E876 Hypokalemia: Secondary | ICD-10-CM

## 2020-01-15 DIAGNOSIS — J3489 Other specified disorders of nose and nasal sinuses: Secondary | ICD-10-CM

## 2020-01-15 DIAGNOSIS — Z0000 Encounter for general adult medical examination without abnormal findings: Secondary | ICD-10-CM

## 2020-01-15 DIAGNOSIS — Z0001 Encounter for general adult medical examination with abnormal findings: Secondary | ICD-10-CM

## 2020-01-15 DIAGNOSIS — I10 Essential (primary) hypertension: Secondary | ICD-10-CM | POA: Diagnosis not present

## 2020-01-15 DIAGNOSIS — E785 Hyperlipidemia, unspecified: Secondary | ICD-10-CM | POA: Diagnosis not present

## 2020-01-15 DIAGNOSIS — Z1159 Encounter for screening for other viral diseases: Secondary | ICD-10-CM

## 2020-01-15 LAB — COMPREHENSIVE METABOLIC PANEL
ALT: 13 U/L (ref 0–35)
AST: 16 U/L (ref 0–37)
Albumin: 4.4 g/dL (ref 3.5–5.2)
Alkaline Phosphatase: 73 U/L (ref 39–117)
BUN: 19 mg/dL (ref 6–23)
CO2: 32 mEq/L (ref 19–32)
Calcium: 9.6 mg/dL (ref 8.4–10.5)
Chloride: 104 mEq/L (ref 96–112)
Creatinine, Ser: 0.94 mg/dL (ref 0.40–1.20)
GFR: 68.48 mL/min (ref >60.00)
Glucose, Bld: 93 mg/dL (ref 70–99)
Potassium: 3.5 mEq/L (ref 3.5–5.1)
Sodium: 141 mEq/L (ref 135–145)
Total Bilirubin: 0.7 mg/dL (ref 0.2–1.2)
Total Protein: 7.1 g/dL (ref 6.0–8.3)

## 2020-01-15 LAB — LIPID PANEL
Cholesterol: 169 mg/dL (ref 0–200)
HDL: 25.8 mg/dL — ABNORMAL LOW (ref >39.00)
LDL Cholesterol: 117 mg/dL — ABNORMAL HIGH (ref 0–99)
NonHDL: 142.91
Total CHOL/HDL Ratio: 7
Triglycerides: 128 mg/dL (ref 0.0–149.0)
VLDL: 25.6 mg/dL (ref 0.0–40.0)

## 2020-01-15 MED ORDER — LORATADINE 10 MG PO TABS: 10.0000 mg | ORAL_TABLET | Freq: Every day | ORAL | 11 refills | Status: AC

## 2020-01-15 NOTE — Patient Instructions (Signed)
Please try adding claritin 10mg  once daily to see if it helps with your nasal congestion.

## 2020-01-15 NOTE — Progress Notes (Signed)
Subjective:    Patient ID: [name], female    DOB: [birth year], 55 y.o.   MRN: [id number]  HPI  Patient presents today for complete physical.  Immunizations: she will get Shingrix #2 from [place], flu shot up to date, had pfizer booster, did not bring card. Tdap up to date Diet: She is eating more fish/chicken. Very little red meat. Lots of water Wt Readings from Last 3 Encounters:  01/15/20 160 lb (72.6 kg)  01/02/20 167 lb (75.8 kg)  11/27/19 168 lb 3.2 oz (76.3 kg)  Exercise: walks after work regularly Colonoscopy: due 2027 Dexa: 2019 Pap Smear: hysterectomy Mammogram: due 7/20 Dental: up to date Vision: up to date  HTN-  BP Readings from Last 3 Encounters:  01/15/20 122/82  01/02/20 (!) 125/42  11/27/19 (!) 147/70   She reports that she has coughing when she lays flat at night.  Denies gerd.    Review of Systems  Constitutional: Negative for unexpected weight change.  HENT: Positive for rhinorrhea. Negative for hearing loss.   Eyes: Negative for visual disturbance.  Respiratory: Negative for cough and shortness of breath.   Cardiovascular: Negative for chest pain.  Gastrointestinal: Negative for blood in stool, constipation and diarrhea.  Genitourinary: Negative for dysuria, frequency and hematuria.  Musculoskeletal: Negative for arthralgias and myalgias.  Skin: Negative for rash.  Neurological:       Reports HA's are currently well controlled  Hematological: Negative for adenopathy.  Psychiatric/Behavioral:       Denies depression/anxiety   Past Medical History:  Diagnosis Date  . Allergy   . Anemia   . Arthritis    left knee  . Headache(784.0)    Chronic migraines--Dr Lewitt  . Hypertension   . Meckel's diverticulum 08/2004   with ischemia  . Myoma 11/2005   right ovary  . Sacroiliac dysfunction 06/23/2012  . Uterine adhesions      Social History   Socioeconomic History  . Marital status: Single    Spouse name: Not on file  . Number of  children: 3  . Years of education: Not on file  . Highest education level: 12th grade  Occupational History  . 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 Smoking status: Never Smoker  . Smokeless tobacco: Never Used  Substance and Sexual Activity  . Alcohol use: Yes    Comment: social  . Drug use: No  . Sexual activity: Yes  Other Topics Concern  . Not on file  Social History Narrative   Lives with daughter (grand-daughter)   Daughter- lives in [place]- lives locally   Works at [employer]   Completed HS   No pets   Enjoys walking, mo   vies, Psychologist, clinical, bowling      Patient is right-handed. Her daughter and granddaughter live with her in a 2 story house. She drinks one cup of coffee a day in the winter months. She drinks tea occasionally. She walks daily, weather permitting.   Social Determinants of Health   Financial Resource Strain: Not on file  Food Insecurity: Not on file  Transportation Needs: Not on file  Physical Activity: Not on file  Stress: Not on file  Social Connections: Not on file  Intimate Partner Violence: Not on file    Past Surgical History:  Procedure Laterality Date  . ABDOMINAL HYSTERECTOMY  11/2005  . bowel reconstruction    . LAPAROSCOPIC SALPINGOOPHERECTOMY  11/2005   right-- assisted with daVinci  robot, and lysis of omental adhesions.  Marland Kitchen LAPAROSCOPY  11/2005   total laparoscopy  . OTHER SURGICAL HISTORY     Meckel's diverticulectomy, small bowel resection and resection of retrocecal appendix.    Family History  Problem Relation Age of Onset  . Hypertension Other   . Hypertension Mother   . Hypertension Father   . Cancer Neg Hx   . Colon cancer Neg Hx   . Esophageal cancer Neg Hx   . Rectal cancer Neg Hx   . Stomach cancer Neg Hx   . Pancreatic cancer Neg Hx     No Known Allergies  Current Outpatient Medications on File Prior to Visit  Medication Sig Dispense Refill  . Calcium Carbonate-Vitamin D 600-400 MG-UNIT  chew tablet Chew 1 tablet by mouth daily.    Dorise Hiss (AIMOVIG) 70 MG/ML SOAJ Inject 70 mg into the skin every 28 (twenty-eight) days. 1 pen 11  . hydrochlorothiazide (HYDRODIURIL) 25 MG tablet Take 1 tablet (25 mg total) by mouth daily. 90 tablet 0  . lisinopril (ZESTRIL) 20 MG tablet TAKE ONE TABLET BY MOUTH ONE TIME DAILY 90 tablet 0  . meloxicam (MOBIC) 15 MG tablet Take 1 tablet (15 mg total) by mouth daily. 30 tablet 0  . nebivolol (BYSTOLIC) 10 MG tablet Take 1 tablet (10 mg total) by mouth daily. 90 tablet 0  . Rimegepant Sulfate (NURTEC) 75 MG TBDP Take 75 mg by mouth daily as needed. 8 tablet 5   No current facility-administered medications on file prior to visit.    BP 122/82 (BP Location: Right Arm, Patient Position: Sitting, Cuff Size: Normal)   Pulse (!) 53   Resp 16   Ht 5\' 3"  (1.6 m)   Wt 160 lb (72.6 kg)   LMP 11/10/2005   SpO2 97%   BMI 28.34 kg/m        Objective:   Physical Exam  Physical Exam  Constitutional: She is oriented to person, place, and time. She appears well-developed and well-nourished. No distress.  HENT:  Head: Normocephalic and atraumatic.  Right Ear: Tympanic membrane and ear canal normal.  Left Ear: Tympanic membrane and ear canal normal.  Mouth/Throat: not examined- wearing mask Eyes: Pupils are equal, round, and reactive to light. No scleral icterus.  Neck: Normal range of motion. No thyromegaly present.  Cardiovascular: Normal rate and regular rhythm.   No murmur heard. Pulmonary/Chest: Effort normal and breath sounds normal. No respiratory distress. He has no wheezes. She has no rales. She exhibits no tenderness.  Abdominal: Soft. Bowel sounds are normal. She exhibits no distension and no mass. There is no tenderness. There is no rebound and no guarding.  Musculoskeletal: She exhibits no edema.  Lymphadenopathy:    She has no cervical adenopathy.  Neurological: She is alert and oriented to person, place, and time. She has  normal patellar reflexes. She exhibits normal muscle tone. Coordination normal.  Skin: Skin is warm and dry.  Psychiatric: She has a normal mood and affect. Her behavior is normal. Judgment and thought content normal.  Breasts/pelvic:  deferred           Assessment & Plan:    Preventative care- discussed healthy diet, exercise, weight loss.  She will get Shingrix #2 from work. Refer for mammogram. Colo up to date.   HTN- bp stable. Continue lisinopril 20mg , hctz 25mg , bystolic 10mg .  She is not currently taking kdur- will check follow up K+ and if low plan to restart potassium.  Rhinorrhea-  trial of claritin 10mg  once daily.  This visit occurred during the SARS-CoV-2 public health emergency.  Safety protocols were in place, including screening questions prior to the visit, additional usage of staff PPE, and extensive cleaning of exam room while observing appropriate contact time as indicated for disinfecting solutions.         Assessment & Plan:

## 2020-01-16 ENCOUNTER — Encounter: Payer: Self-pay | Admitting: Family

## 2020-01-16 LAB — HEPATITIS C ANTIBODY
Hepatitis C Ab: NONREACTIVE
SIGNAL TO CUT-OFF: 0.06 (ref <1.00)

## 2020-01-17 NOTE — Progress Notes (Signed)
MAILED OUT TO PATIENT

## 2020-03-10 IMAGING — [facility] DIGITAL DIAGNOSTIC UNILATERAL RIGHT MAMMOGRAM WITH TOMO AND CAD
6 series · 6 of 14 positions shown · non-contrast
Comparison: Previous exam(s).

CLINICAL DATA: Short-term interval follow-up of probable benign
right breast calcifications.

EXAM:
DIGITAL DIAGNOSTIC UNILATERAL RIGHT MAMMOGRAM WITH CAD AND TOMO

[R CC]
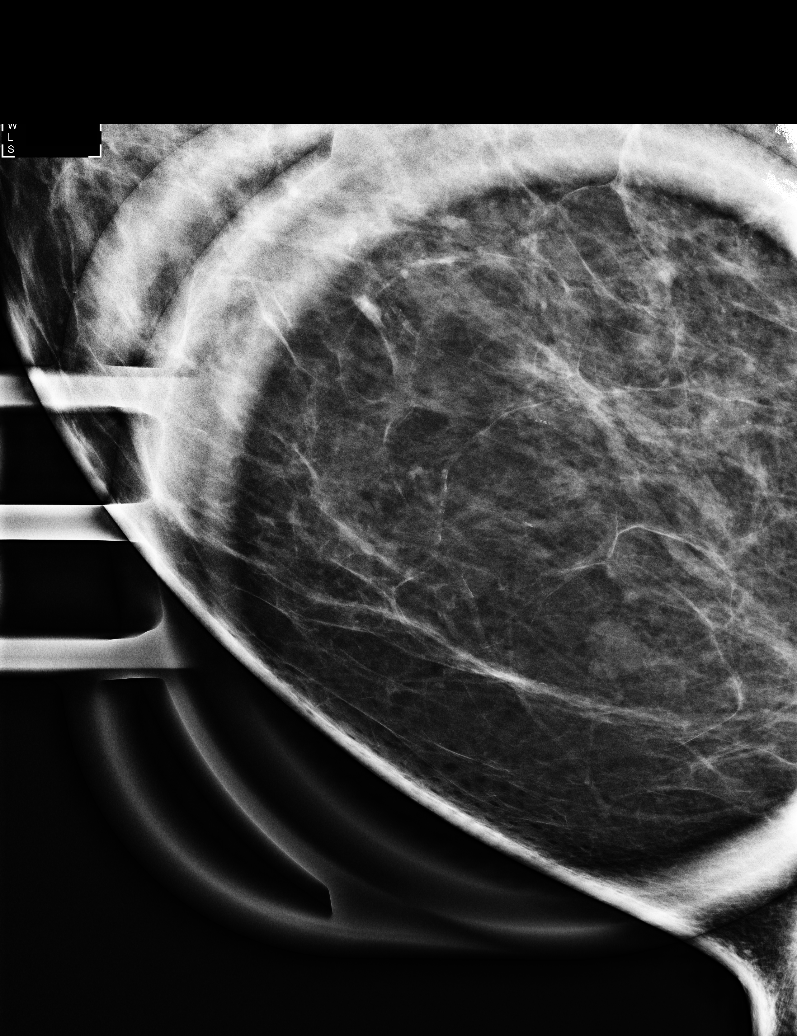

[R ML]
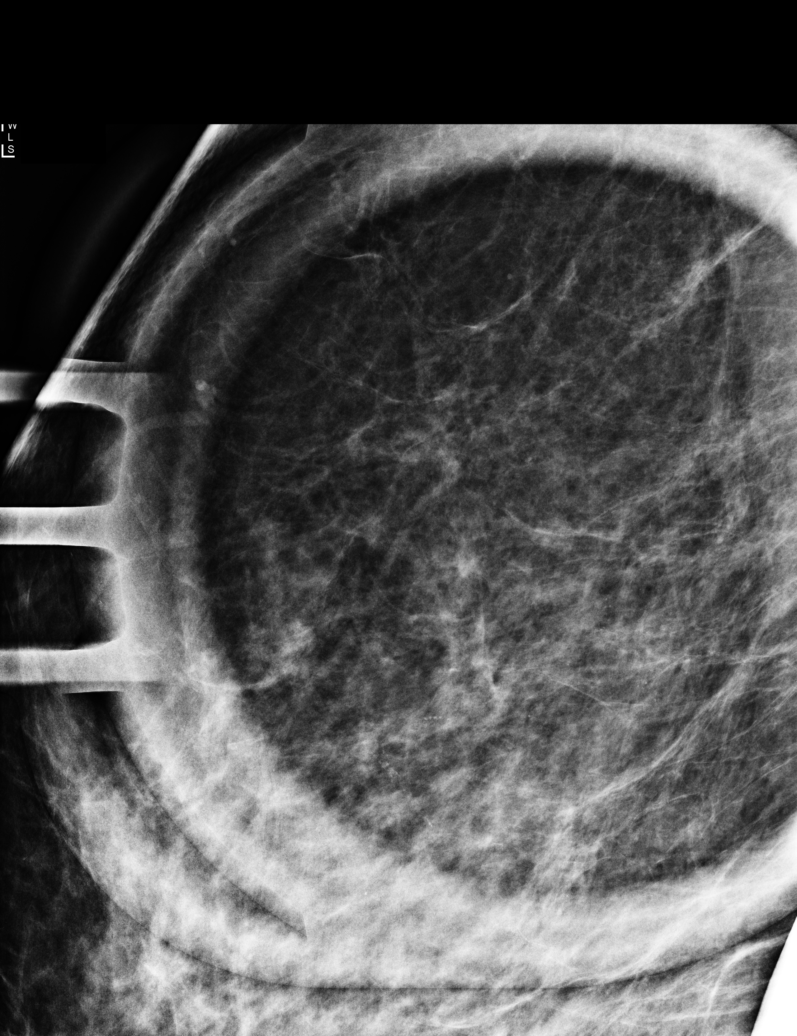

[R CC synth-2D]
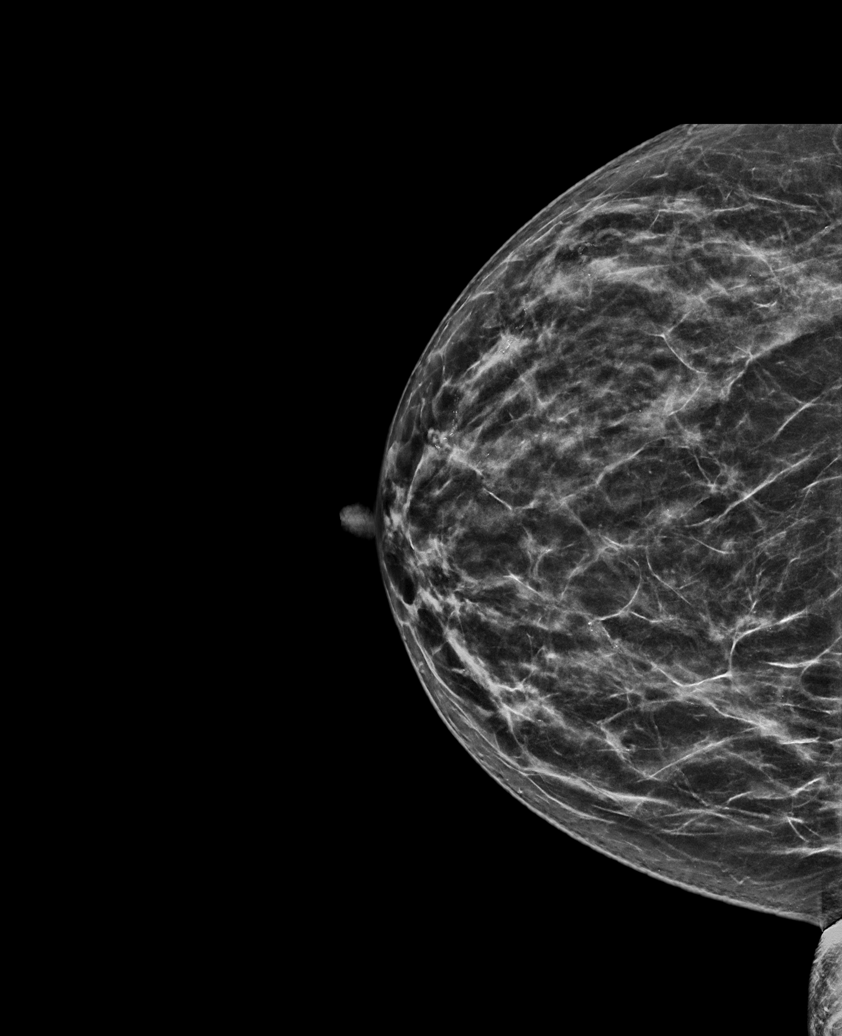

[R MLO synth-2D]
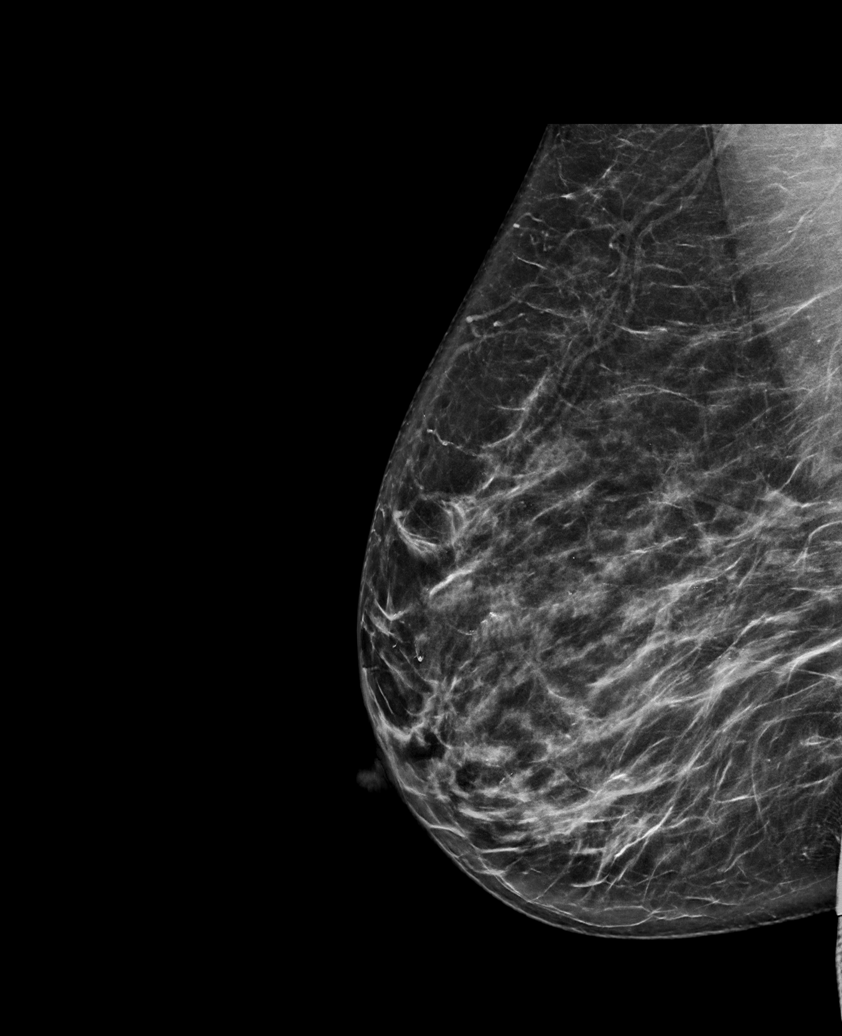

[R CC tomo · tomo slice 35/70.0]
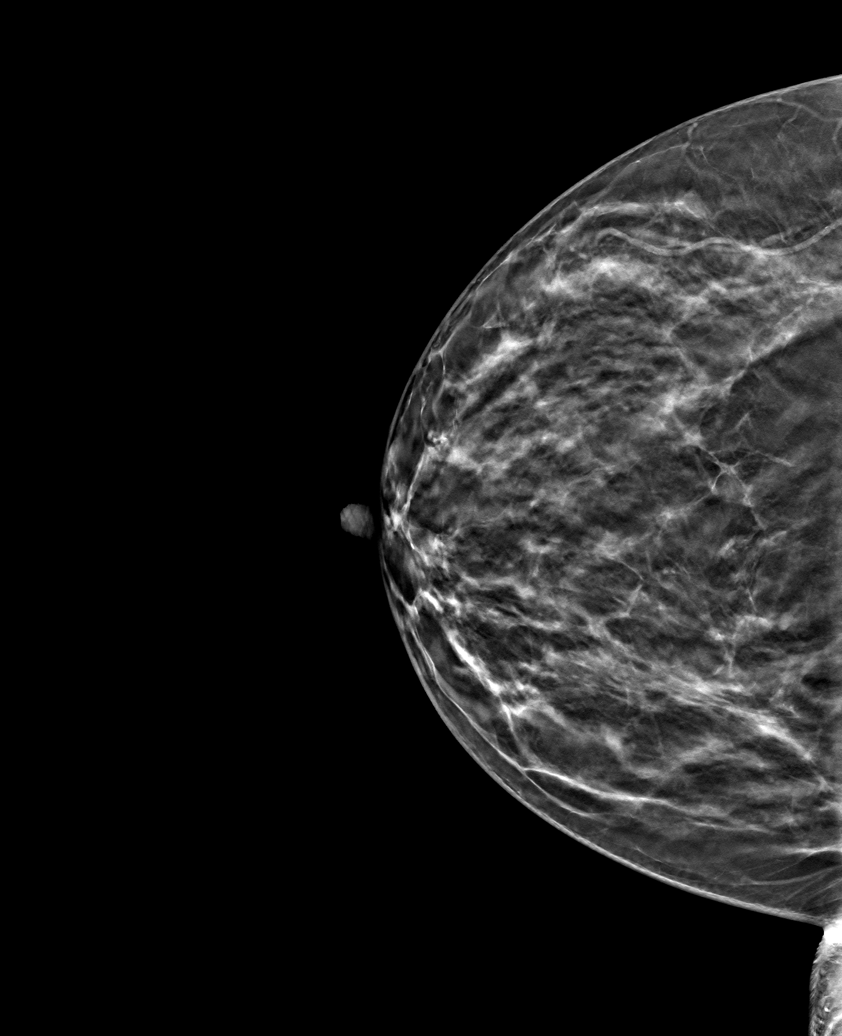

[R MLO tomo · tomo slice 41/80.0]
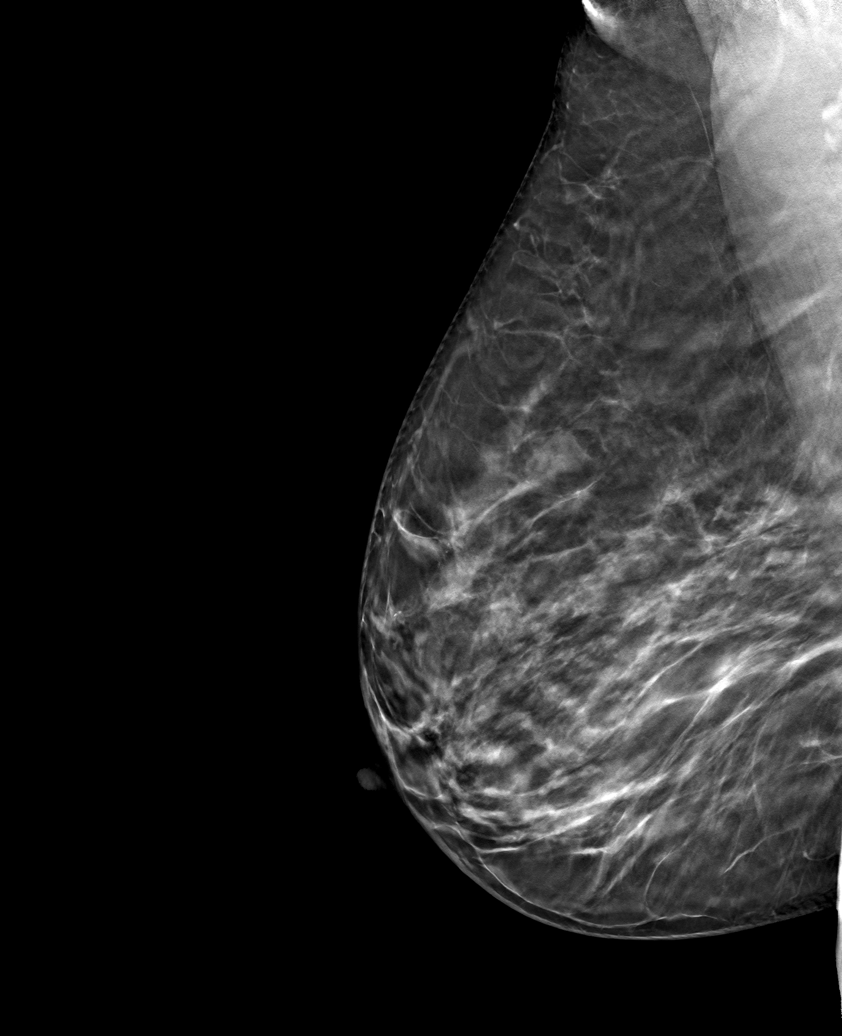

[6 of 14 positions shown; findings below may reference images not displayed]

ACR Breast Density Category c: The breast tissue is heterogeneously
dense, which may obscure small masses.
FINDINGS: Calcifications in the medial aspect of the right breast are stable
compared to the prior exam dated 07/19/2017. Some of these are
punctate and others appear vascular. There is no suspicious mass.

Mammographic images were processed with CAD.
IMPRESSION: Stable probable benign calcifications in the right breast.

RECOMMENDATION:
Bilateral diagnostic mammogram in 6 months is recommended.

I have discussed the findings and recommendations with the patient.
Results were also provided in writing at the conclusion of the
visit. If applicable, a reminder letter will be sent to the patient
regarding the next appointment.

BI-RADS CATEGORY  3: Probably benign.

## 2020-05-25 ENCOUNTER — Other Ambulatory Visit: Payer: Self-pay | Admitting: Family

## 2020-05-25 DIAGNOSIS — Z1231 Encounter for screening mammogram for malignant neoplasm of breast: Secondary | ICD-10-CM

## 2020-05-26 NOTE — Progress Notes (Signed)
NEUROLOGY FOLLOW UP OFFICE NOTE  [name] [id number]  Assessment/Plan:   Migraine without aura, without status migrainosus, not intractable  1.  Migraine prevention:  Aimovig 70mg  Q28d refilled 2.  Migraine rescue:  Nurtec 75mg  refilled 3.  Limit use of pain relievers to no more than 2 days out of week to prevent risk of rebound or medication-overuse headache. 4.  Keep headache diary 5.  Follow up 9 months  Subjective:  Aleaya Latona a 55 year old right-handed female with hypertension whofollows up for migranes.  UPDATE: Intensity:  mild Duration:  A couple of hours with Nurtec Frequency:  1 in past 30 days. Current NSAIDS:none Current analgesics:None Current triptans:none Current ergotamine:None Current anti-emetic:None Current muscle relaxants:None Current anti-anxiolytic:None Current sleep aide:None Current Antihypertensive medications:Lisinopril, Bystolic, HCTZ Current Antidepressant medications:None Current Anticonvulsant medications:None Current anti-CGRP:Aimovig 70mg  every 28 days; Nurtec rescue Current Vitamins/Herbal/Supplements:None Current Antihistamines/Decongestants:None Other therapy:None Remote/birth control: None  Caffeine:Rarely coffee or sweet tea Alcohol:No Smoker:No Diet:Hydrates. No soda Exercise:Walks daily Depression:No; Anxiety:No Other pain:No Sleep hygiene:Good  HISTORY: Onset: In her mid 32s Location:Unilateral either side (retro-orbital to back of head, right greater than left) and into neck Quality:throbbing Initial intensity:Mild-moderate and severe.Shedenies new headache, thunderclap headache or severe headache that wakes from sleep. Aura:no Prodrome:no Postdrome:no Associated symptoms: Conjunctival injection,dizziness, nausea, photophobia, phonophobia, blurred vision. She denies associated unilateral numbness or weakness. Initial duration:Mild-moderate: 1/2 day; Severe: 3  days. Often wakes up with migraine. InitialFrequency:Mild-moderate: 2 days a month; Severe: 1 every 3 months InitialFrequency of abortive medication:2 days a month Triggers: Hot dogs Relieving factors: Heating pad, cold pack or hot shower on neck Activity:Severe aggravates  Past NSAIDS:ibuprofen, naproxen Past analgesics:Excedrin Migraine, Tylenol Past abortive triptans:Sumatriptan tablet; Relpax 40 mg, Zomig NS (not covered by insurance) Past muscle relaxants:Flexeril, Robaxin Past anti-emetic:no Past antihypertensive medications:no Past antidepressant medications:no Past anticonvulsant medications: topiramate (not sure why taken off) Past vitamins/Herbal/Supplements:no Past antihistamines/decongestants:no Other past therapies:no  Family history of headache:no  PAST MEDICAL HISTORY: Past Medical History:  Diagnosis Date  . Allergy   . Anemia   . Arthritis    left knee  . Headache(784.0)    Chronic migraines--Dr Lewitt  . Hypertension   . Meckel's diverticulum 08/2004   with ischemia  . Myoma 11/2005   right ovary  . Sacroiliac dysfunction 06/23/2012  . Uterine adhesions     MEDICATIONS: Current Outpatient Medications on File Prior to Visit  Medication Sig Dispense Refill  . Calcium Carbonate-Vitamin D 600-400 MG-UNIT chew tablet Chew 1 tablet by mouth daily.    Eduard Roux (AIMOVIG) 70 MG/ML SOAJ Inject 70 mg into the skin every 28 (twenty-eight) days. 1 pen 11  . hydrochlorothiazide (HYDRODIURIL) 25 MG tablet Take 1 tablet (25 mg total) by mouth daily. 90 tablet 0  . lisinopril (ZESTRIL) 20 MG tablet TAKE ONE TABLET BY MOUTH ONE TIME DAILY 90 tablet 0  . loratadine (CLARITIN) 10 MG tablet Take 1 tablet (10 mg total) by mouth daily. 30 tablet 11  . meloxicam (MOBIC) 15 MG tablet Take 1 tablet (15 mg total) by mouth daily. 30 tablet 0  . nebivolol (BYSTOLIC) 10 MG tablet Take 1 tablet (10 mg total) by mouth daily. 90 tablet 0  .  Rimegepant Sulfate (NURTEC) 75 MG TBDP Take 75 mg by mouth daily as needed. 8 tablet 5   No current facility-administered medications on file prior to visit.    ALLERGIES: No Known Allergies  FAMILY HISTORY: Family History  Problem Relation Age of Onset  . Hypertension Other   .  Hypertension Mother   . Hypertension Father   . Cancer Neg Hx   . Colon cancer Neg Hx   . Esophageal cancer Neg Hx   . Rectal cancer Neg Hx   . Stomach cancer Neg Hx   . Pancreatic cancer Neg Hx       Objective:  Blood pressure (!) 152/95, pulse 73, height 5\' 3"  (1.6 m), weight 153 lb 12.8 oz (69.8 kg), last menstrual period 11/10/2005, SpO2 98 %. General: No acute distress.  Patient appears well-groomed.     Metta Clines, DO  CC: Debbrah Alar, NP

## 2020-05-27 ENCOUNTER — Encounter: Payer: Self-pay | Admitting: Neurology

## 2020-05-27 ENCOUNTER — Other Ambulatory Visit: Payer: Self-pay

## 2020-05-27 ENCOUNTER — Ambulatory Visit: Payer: 59 | Admitting: Neurology

## 2020-05-27 VITALS — BP 152/95 | HR 73 | Ht 63.0 in | Wt 153.8 lb

## 2020-05-27 DIAGNOSIS — G43009 Migraine without aura, not intractable, without status migrainosus: Secondary | ICD-10-CM | POA: Diagnosis not present

## 2020-05-27 MED ORDER — AIMOVIG 70 MG/ML ~~LOC~~ SOAJ: 70.0000 mg | SUBCUTANEOUS | 5 refills | Status: DC

## 2020-05-27 MED ORDER — NURTEC 75 MG PO TBDP: 75.0000 mg | ORAL_TABLET | Freq: Every day | ORAL | 5 refills | Status: DC | PRN

## 2020-05-27 NOTE — Patient Instructions (Signed)
  1. Continue Aimovig 70mg  every 28 days 2. Take Nurtec at earliest onset of headache.  Maximum 1 tablet in 24 hours. 3. Limit use of pain relievers to no more than 2 days out of the week.  These medications include acetaminophen, NSAIDs (ibuprofen/Advil/Motrin, naproxen/Aleve, triptans (Imitrex/sumatriptan), Excedrin, and narcotics.  This will help reduce risk of rebound headaches. 4. Be aware of common food triggers:  - Caffeine:  coffee, black tea, cola, Mt. Dew  - Chocolate  - Dairy:  aged cheeses (brie, blue, cheddar, gouda, [place], provolone, [place], Swiss, etc), chocolate milk, buttermilk, sour cream, limit eggs and yogurt  - Nuts, peanut butter  - Alcohol  - Cereals/grains:  FRESH breads (fresh bagels, sourdough, doughnuts), yeast productions  - Processed/canned/aged/cured meats (pre-packaged deli meats, hotdogs)  - MSG/glutamate:  soy sauce, flavor enhancer, pickled/preserved/marinated foods  - Sweeteners:  aspartame (Equal, Nutrasweet).  Sugar and Splenda are okay  - Vegetables:  legumes (lima beans, lentils, snow peas, fava beans, pinto peans, peas, garbanzo beans), sauerkraut, onions, olives, pickles  - Fruit:  avocados, bananas, citrus fruit (orange, lemon, grapefruit), mango  - Other:  Frozen meals, macaroni and cheese 5. Routine exercise 6. Stay adequately hydrated (aim for 64 oz water daily) 7. Keep headache diary 8. Maintain proper stress management 9. Maintain proper sleep hygiene 10. Do not skip meals 11. Consider supplements:  magnesium citrate 400mg  daily, riboflavin 400mg  daily, coenzyme Q10 100mg  three times daily.

## 2020-07-15 ENCOUNTER — Telehealth: Payer: Self-pay | Admitting: Family

## 2020-07-15 ENCOUNTER — Ambulatory Visit: Payer: 59 | Admitting: Family

## 2020-07-15 ENCOUNTER — Other Ambulatory Visit: Payer: Self-pay

## 2020-07-15 ENCOUNTER — Encounter: Payer: Self-pay | Admitting: Family

## 2020-07-15 VITALS — BP 126/82 | HR 63 | Temp 97.6°F | Resp 16 | Ht 63.0 in | Wt 156.0 lb

## 2020-07-15 DIAGNOSIS — E785 Hyperlipidemia, unspecified: Secondary | ICD-10-CM

## 2020-07-15 DIAGNOSIS — G43009 Migraine without aura, not intractable, without status migrainosus: Secondary | ICD-10-CM

## 2020-07-15 DIAGNOSIS — I10 Essential (primary) hypertension: Secondary | ICD-10-CM | POA: Diagnosis not present

## 2020-07-15 DIAGNOSIS — E876 Hypokalemia: Secondary | ICD-10-CM

## 2020-07-15 LAB — BASIC METABOLIC PANEL
BUN: 17 mg/dL (ref 6–23)
CO2: 30 mEq/L (ref 19–32)
Calcium: 9.7 mg/dL (ref 8.4–10.5)
Chloride: 104 mEq/L (ref 96–112)
Creatinine, Ser: 0.86 mg/dL (ref 0.40–1.20)
GFR: 75.92 mL/min (ref >60.00)
Glucose, Bld: 84 mg/dL (ref 70–99)
Potassium: 3.4 mEq/L — ABNORMAL LOW (ref 3.5–5.1)
Sodium: 141 mEq/L (ref 135–145)

## 2020-07-15 MED ORDER — NEBIVOLOL HCL 10 MG PO TABS: 10.0000 mg | ORAL_TABLET | Freq: Every day | ORAL | 1 refills | Status: DC

## 2020-07-15 MED ORDER — POTASSIUM CHLORIDE ER 10 MEQ PO TBCR: 10.0000 meq | EXTENDED_RELEASE_TABLET | Freq: Every day | ORAL | 5 refills | Status: DC

## 2020-07-15 MED ORDER — HYDROCHLOROTHIAZIDE 25 MG PO TABS: 25.0000 mg | ORAL_TABLET | Freq: Every day | ORAL | 1 refills | Status: DC

## 2020-07-15 MED ORDER — LISINOPRIL 20 MG PO TABS: 20.0000 mg | ORAL_TABLET | Freq: Every day | ORAL | 1 refills | Status: DC

## 2020-07-15 NOTE — Telephone Encounter (Signed)
Results given to patient, she will p/up medication tomorrow and she will come in next week for labs

## 2020-07-15 NOTE — Assessment & Plan Note (Signed)
Last lipid panel was improved. Encouraged pt to continue to work on low fat diet.

## 2020-07-15 NOTE — Assessment & Plan Note (Signed)
Stable, management per neurology. 

## 2020-07-15 NOTE — Assessment & Plan Note (Signed)
Blood pressure is stable/at goal. Continue lisinopril 20mg , bystolic 10mg , hctz 25mg .

## 2020-07-15 NOTE — Telephone Encounter (Signed)
Potassium is a little low. I would like her to add kdur 12mEQ once daily. Repeat bmet 1 week, dx hypokalemia.

## 2020-07-15 NOTE — Telephone Encounter (Signed)
Can you please call [facility] and see if she completed the second Shingrix vaccine? If not, please remind her to go back for it. If she has, please document date in chart.

## 2020-07-15 NOTE — Patient Instructions (Signed)
Please complete lab work prior to leaving.   

## 2020-07-15 NOTE — Telephone Encounter (Signed)
[facility] confirmed she had shingrix 10-19-2018 and 02-21-2019

## 2020-07-15 NOTE — Progress Notes (Signed)
   Subjective:    Patient ID: [name], female    DOB: 08-21-1965, 55 y.o.   MRN: [id number]  Hypertension   HTN- maintained on lisinopril 20mg , bystolic 10mg , hctz 25mg .  BP Readings from Last 3 Encounters:  07/15/20 126/82  05/27/20 (!) 152/95  01/15/20 122/82   Migraines- reports stable. Following with Neurology.   Hyperlipidemia-  Lab Results  Component Value Date   CHOL 169 01/15/2020   HDL 25.80 (L) 01/15/2020   LDLCALC 117 (H) 01/15/2020   TRIG 128.0 01/15/2020   CHOLHDL 7 01/15/2020      Review of Systems    See HPI Objective:   Physical Exam Constitutional:      Appearance: Normal appearance. She is well-developed.  HENT:     Head: Normocephalic and atraumatic.  Cardiovascular:     Rate and Rhythm: Normal rate and regular rhythm.     Heart sounds: Normal heart sounds. No murmur heard. Pulmonary:     Effort: Pulmonary effort is normal. No respiratory distress.     Breath sounds: Normal breath sounds. No wheezing.  Neurological:     Mental Status: She is alert.  Psychiatric:        Behavior: Behavior normal.        Thought Content: Thought content normal.        Judgment: Judgment normal.          Assessment & Plan:

## 2020-07-22 ENCOUNTER — Ambulatory Visit
Admission: RE | Admit: 2020-07-22 | Discharge: 2020-07-22 | Disposition: A | Discharge status: Home / Self Care | Payer: 59 | Source: Ambulatory Visit | Attending: Family | Admitting: Family

## 2020-07-22 ENCOUNTER — Other Ambulatory Visit: Payer: Self-pay

## 2020-07-22 ENCOUNTER — Other Ambulatory Visit (INDEPENDENT_AMBULATORY_CARE_PROVIDER_SITE_OTHER): Payer: 59

## 2020-07-22 DIAGNOSIS — E876 Hypokalemia: Secondary | ICD-10-CM | POA: Diagnosis not present

## 2020-07-22 DIAGNOSIS — Z1231 Encounter for screening mammogram for malignant neoplasm of breast: Secondary | ICD-10-CM

## 2020-07-22 LAB — BASIC METABOLIC PANEL
BUN: 20 mg/dL (ref 6–23)
CO2: 28 mEq/L (ref 19–32)
Calcium: 9.6 mg/dL (ref 8.4–10.5)
Chloride: 105 mEq/L (ref 96–112)
Creatinine, Ser: 0.88 mg/dL (ref 0.40–1.20)
GFR: 73.85 mL/min (ref >60.00)
Glucose, Bld: 101 mg/dL — ABNORMAL HIGH (ref 70–99)
Potassium: 3.8 mEq/L (ref 3.5–5.1)
Sodium: 140 mEq/L (ref 135–145)

## 2021-01-05 ENCOUNTER — Other Ambulatory Visit: Payer: Self-pay | Admitting: Family

## 2021-01-20 ENCOUNTER — Ambulatory Visit (INDEPENDENT_AMBULATORY_CARE_PROVIDER_SITE_OTHER): Payer: 59 | Admitting: Family

## 2021-01-20 ENCOUNTER — Encounter: Payer: Self-pay | Admitting: Family

## 2021-01-20 VITALS — BP 138/85 | HR 67 | Temp 97.7°F | Resp 18 | Ht 63.0 in | Wt 159.0 lb

## 2021-01-20 DIAGNOSIS — Z0000 Encounter for general adult medical examination without abnormal findings: Secondary | ICD-10-CM

## 2021-01-20 DIAGNOSIS — G43009 Migraine without aura, not intractable, without status migrainosus: Secondary | ICD-10-CM | POA: Diagnosis not present

## 2021-01-20 DIAGNOSIS — E785 Hyperlipidemia, unspecified: Secondary | ICD-10-CM | POA: Diagnosis not present

## 2021-01-20 DIAGNOSIS — I10 Essential (primary) hypertension: Secondary | ICD-10-CM | POA: Diagnosis not present

## 2021-01-20 LAB — LIPID PANEL
Cholesterol: 182 mg/dL (ref 0–200)
HDL: 36.7 mg/dL — ABNORMAL LOW (ref >39.00)
LDL Cholesterol: 128 mg/dL — ABNORMAL HIGH (ref 0–99)
NonHDL: 145.02
Total CHOL/HDL Ratio: 5
Triglycerides: 86 mg/dL (ref 0.0–149.0)
VLDL: 17.2 mg/dL (ref 0.0–40.0)

## 2021-01-20 LAB — COMPREHENSIVE METABOLIC PANEL
ALT: 12 U/L (ref 0–35)
AST: 14 U/L (ref 0–37)
Albumin: 4.1 g/dL (ref 3.5–5.2)
Alkaline Phosphatase: 93 U/L (ref 39–117)
BUN: 18 mg/dL (ref 6–23)
CO2: 31 mEq/L (ref 19–32)
Calcium: 9.3 mg/dL (ref 8.4–10.5)
Chloride: 104 mEq/L (ref 96–112)
Creatinine, Ser: 0.87 mg/dL (ref 0.40–1.20)
GFR: 74.6 mL/min (ref >60.00)
Glucose, Bld: 81 mg/dL (ref 70–99)
Potassium: 3.8 mEq/L (ref 3.5–5.1)
Sodium: 140 mEq/L (ref 135–145)
Total Bilirubin: 0.6 mg/dL (ref 0.2–1.2)
Total Protein: 6.7 g/dL (ref 6.0–8.3)

## 2021-01-20 NOTE — Assessment & Plan Note (Signed)
BP is acceptable. Continue current doses of bystolic, lisinopril and hctz.

## 2021-01-20 NOTE — Assessment & Plan Note (Signed)
Improved with Aimovig injections- management per Neurology.

## 2021-01-20 NOTE — Progress Notes (Signed)
Subjective:     Patient ID: [name], female    DOB: [birth year], 56 y.o.   MRN: [id number]  Chief Complaint  Patient presents with   Annual Exam    Doing well , no complaints    HPI  Patient is in today for cpx.  Immunizations: tetanus, flu shingrix.   Diet: healthy Wt Readings from Last 3 Encounters:  01/20/21 159 lb (72.1 kg)  07/15/20 156 lb (70.8 kg)  05/27/20 153 lb 12.8 oz (69.8 kg)  Exercise: yes goes to the gym Colonoscopy: 2017 Pap Smear: hysterectomy Mammogram: 07/22/20 Vision: up to date Dental: up to date  HTN- reports that her home bp readings are 120-130/80.  BP Readings from Last 3 Encounters:  01/20/21 138/85  07/15/20 126/82  05/27/20 (!) 152/95    There are no preventive care reminders to display for this patient.   Past Medical History:  Diagnosis Date   Allergy    Anemia    Arthritis    left knee   Headache(784.0)    Chronic migraines--Dr Lewitt   Hypertension    Meckel's diverticulum 08/2004   with ischemia   Myoma 11/2005   right ovary   Sacroiliac dysfunction 06/23/2012   Uterine adhesions     Past Surgical History:  Procedure Laterality Date   ABDOMINAL HYSTERECTOMY  11/2005   bowel reconstruction     LAPAROSCOPIC SALPINGOOPHERECTOMY  11/2005   right-- assisted with daVinci robot, and lysis of omental adhesions.   LAPAROSCOPY  11/2005   total laparoscopy   OTHER SURGICAL HISTORY     Meckel's diverticulectomy, small bowel resection and resection of retrocecal appendix.    Family History  Problem Relation Age of Onset   Hypertension Other    Hypertension Mother    Hypertension Father    Cancer Neg Hx    Colon cancer Neg Hx    Esophageal cancer Neg Hx    Rectal cancer Neg Hx    Stomach cancer Neg Hx    Pancreatic cancer Neg Hx     Social History   Socioeconomic History   Marital status: Single    Spouse name: Not on file   Number of children: 3   Years of education: Not on file   Highest education level:  12th grade  Occupational History   Occupation: Surveyor, quantity: [employer]  Tobacco Use   Smoking status: Never   Smokeless tobacco: Never  Substance and Sexual Activity   Alcohol use: Yes    Comment: social   Drug use: No   Sexual activity: Yes    Partners: Male  Other Topics Concern   Not on file  Social History Narrative   Lives with daughter (grand-daughter)   Daughter- lives in [place]- lives locally   Works at [employer]   Completed HS   No pets   Enjoys walking, mo   vies, Psychologist, clinical, bowling      Patient is right-handed. Her daughter and granddaughter live with her in a 2 story house. She drinks one cup of coffee a day in the winter months. She drinks tea occasionally. She walks daily, weather permitting.   Social Determinants of Health   Financial Resource Strain: Not on file  Food Insecurity: Not on file  Transportation Needs: Not on file  Physical Activity: Not on file  Stress: Not on file  Social Connections: Not on file  Intimate Partner Violence: Not on file    Outpatient Medications Prior to  Visit  Medication Sig Dispense Refill   Erenumab-aooe (AIMOVIG) 70 MG/ML SOAJ Inject 70 mg into the skin every 28 (twenty-eight) days. 1 mL 5   hydrochlorothiazide (HYDRODIURIL) 25 MG tablet TAKE ONE TABLET BY MOUTH ONE TIME DAILY 90 tablet 0   lisinopril (ZESTRIL) 20 MG tablet TAKE ONE TABLET BY MOUTH ONE TIME DAILY 90 tablet 0   loratadine (CLARITIN) 10 MG tablet Take 1 tablet (10 mg total) by mouth daily. 30 tablet 11   nebivolol (BYSTOLIC) 10 MG tablet Take 1 tablet (10 mg total) by mouth daily. 90 tablet 1   potassium chloride (KLOR-CON) 10 MEQ tablet Take 1 tablet (10 mEq total) by mouth daily. 30 tablet 5   Rimegepant Sulfate (NURTEC) 75 MG TBDP Take 75 mg by mouth daily as needed. 8 tablet 5   meloxicam (MOBIC) 15 MG tablet Take 1 tablet (15 mg total) by mouth daily. 30 tablet 0   No facility-administered medications prior to visit.    No Known  Allergies  Review of Systems  Constitutional:  Negative for weight loss.  HENT:  Negative for congestion and hearing loss.   Eyes:  Negative for blurred vision.  Respiratory:  Negative for cough and shortness of breath.   Cardiovascular:  Negative for chest pain.  Gastrointestinal:  Negative for constipation and diarrhea.  Genitourinary:  Negative for dysuria, frequency and hematuria.  Musculoskeletal:  Negative for joint pain and myalgias.  Skin:  Negative for rash.  Neurological:  Positive for headaches (rare migraines on current treatment).  Psychiatric/Behavioral:         Denies depression/anxity      Objective:    Physical Exam  BP 138/85    Pulse 67    Temp 97.7 F (36.5 C)    Resp 18    Ht '5\' 3"'  (1.6 m)    Wt 159 lb (72.1 kg)    LMP 11/10/2005    SpO2 97%    BMI 28.17 kg/m  Wt Readings from Last 3 Encounters:  01/20/21 159 lb (72.1 kg)  07/15/20 156 lb (70.8 kg)  05/27/20 153 lb 12.8 oz (69.8 kg)   Physical Exam  Constitutional: She is oriented to person, place, and time. She appears well-developed and well-nourished. No distress.  HENT:  Head: Normocephalic and atraumatic.  Right Ear: Tympanic membrane and ear canal normal.  Left Ear: Tympanic membrane and ear canal normal.  Mouth/Throat: not examined, pt wearing mask Eyes: Pupils are equal, round, and reactive to light. No scleral icterus.  Neck: Normal range of motion. No thyromegaly present.  Cardiovascular: Normal rate and regular rhythm.   No murmur heard. Pulmonary/Chest: Effort normal and breath sounds normal. No respiratory distress. He has no wheezes. She has no rales. She exhibits no tenderness.  Abdominal: Soft. Bowel sounds are normal. She exhibits no distension and no mass. There is no tenderness. There is no rebound and no guarding.  Musculoskeletal: She exhibits no edema.  Lymphadenopathy:    She has no cervical adenopathy.  Neurological: She is alert and oriented to person, place, and time. She has  normal patellar reflexes. She exhibits normal muscle tone. Coordination normal.  Skin: Skin is warm and dry.  Psychiatric: She has a normal mood and affect. Her behavior is normal. Judgment and thought content normal.  Breast/pelvic: deferred          Assessment & Plan:       Assessment & Plan:   Problem List Items Addressed This Visit  Unprioritized   Preventative health care - Primary    Discussed healthy diet, exercise and weight loss.  Immunizations reviewed and up to date. mammo and colo up to date.       Migraine    Improved with Aimovig injections- management per Neurology.       Hyperlipidemia   Relevant Orders   Lipid panel   Essential hypertension    BP is acceptable. Continue current doses of bystolic, lisinopril and hctz.       Other Visit Diagnoses     Primary hypertension       Relevant Orders   Comp Met (CMET)       I have discontinued [name]'s meloxicam. I am also having her maintain her loratadine, Aimovig, Nurtec, nebivolol, potassium chloride, hydrochlorothiazide, and lisinopril.  No orders of the defined types were placed in this encounter.

## 2021-01-20 NOTE — Assessment & Plan Note (Signed)
Discussed healthy diet, exercise and weight loss.  Immunizations reviewed and up to date. mammo and colo up to date.

## 2021-03-02 NOTE — Progress Notes (Signed)
NEUROLOGY FOLLOW UP OFFICE NOTE  Samantha SLABAUGH [id number]  Assessment/Plan:   Migraine without aura, without status migrainosus, not intractable   1.  Migraine prevention:  Aimovig 70mg  Q28d refilled 2.  Migraine rescue:  Nurtec 75mg  refilled 3.  Limit use of pain relievers to no more than 2 days out of week to prevent risk of rebound or medication-overuse headache. 4.  Keep headache diary 5.  Follow up 1 year   Subjective:  [name] is a 56 year old right-handed female with hypertension who follows up for migranes.   UPDATE: Intensity:  mild Duration:  A couple of hours with Nurtec Frequency:  1 in past 30 days. Current NSAIDS: none Current analgesics: None Current triptans: none Current ergotamine: None Current anti-emetic: None Current muscle relaxants: None Current anti-anxiolytic: None Current sleep aide: None Current Antihypertensive medications: Lisinopril, Bystolic, HCTZ Current Antidepressant medications: None Current Anticonvulsant medications: None Current anti-CGRP: Aimovig 70mg  every 28 days; Nurtec rescue Current Vitamins/Herbal/Supplements: None Current Antihistamines/Decongestants: None Other therapy: None Remote/birth control: None   Caffeine: Rarely coffee or sweet tea Alcohol: No Smoker: No Diet: Hydrates.  No soda Exercise: Walks daily Depression: No; Anxiety: No Other pain: No Sleep hygiene: Good   HISTORY:  Onset: In her mid 68s Location:  Unilateral either side (retro-orbital to back of head, right greater than left) and into neck Quality:  throbbing Initial intensity:  Mild-moderate and severe.  She denies new headache, thunderclap headache or severe headache that wakes from sleep. Aura:  no Prodrome:  no Postdrome:  no Associated symptoms: Conjunctival injection, dizziness, nausea, photophobia, phonophobia, blurred vision.  She denies associated unilateral numbness or weakness. Initial duration:  Mild-moderate:  1/2 day; Severe: 3  days. Often wakes up with migraine. Initial Frequency:  Mild-moderate:  2 days a month; Severe: 1 every 3 months Initial Frequency of abortive medication: 2 days a month Triggers: Hot dogs Relieving factors: Heating pad, cold pack or hot shower on neck Activity:  Severe aggravates   Past NSAIDS:  ibuprofen, naproxen Past analgesics:  Excedrin Migraine, Tylenol Past abortive triptans:  Sumatriptan tablet; Relpax 40 mg, Zomig NS (not covered by insurance) Past muscle relaxants:  Flexeril, Robaxin Past anti-emetic:  no Past antihypertensive medications:  no Past antidepressant medications:  no Past anticonvulsant medications:  topiramate (not sure why taken off) Past vitamins/Herbal/Supplements:  no Past antihistamines/decongestants:  no Other past therapies:  no   Family history of headache:  no    PAST MEDICAL HISTORY: Past Medical History:  Diagnosis Date   Allergy    Anemia    Arthritis    left knee   Headache(784.0)    Chronic migraines--Dr Lewitt   Hypertension    Meckel's diverticulum 08/2004   with ischemia   Myoma 11/2005   right ovary   Sacroiliac dysfunction 06/23/2012   Uterine adhesions     MEDICATIONS: Current Outpatient Medications on File Prior to Visit  Medication Sig Dispense Refill   Erenumab-aooe (AIMOVIG) 70 MG/ML SOAJ Inject 70 mg into the skin every 28 (twenty-eight) days. 1 mL 5   hydrochlorothiazide (HYDRODIURIL) 25 MG tablet TAKE ONE TABLET BY MOUTH ONE TIME DAILY 90 tablet 0   lisinopril (ZESTRIL) 20 MG tablet TAKE ONE TABLET BY MOUTH ONE TIME DAILY 90 tablet 0   loratadine (CLARITIN) 10 MG tablet Take 1 tablet (10 mg total) by mouth daily. 30 tablet 11   nebivolol (BYSTOLIC) 10 MG tablet Take 1 tablet (10 mg total) by mouth daily. 90 tablet 1  potassium chloride (KLOR-CON) 10 MEQ tablet Take 1 tablet (10 mEq total) by mouth daily. 30 tablet 5   Rimegepant Sulfate (NURTEC) 75 MG TBDP Take 75 mg by mouth daily as needed. 8 tablet 5   No current  facility-administered medications on file prior to visit.    ALLERGIES: No Known Allergies  FAMILY HISTORY: Family History  Problem Relation Age of Onset   Hypertension Other    Hypertension Mother    Hypertension Father    Cancer Neg Hx    Colon cancer Neg Hx    Esophageal cancer Neg Hx    Rectal cancer Neg Hx    Stomach cancer Neg Hx    Pancreatic cancer Neg Hx       Objective:  Blood pressure (!) 146/79, pulse 68, height 5\' 3"  (1.6 m), weight 162 lb 3.2 oz (73.6 kg), last menstrual period 11/10/2005, SpO2 99 %. General: No acute distress.  Patient appears well-groomed.   Head:  Normocephalic/atraumatic Eyes:  Fundi examined but not visualized Neurological Exam: alert and oriented to person, place, and time.  Speech fluent and not dysarthric, language intact.  CN II-XII intact. Bulk and tone normal, muscle strength 5/5 throughout.  Sensation to light touch intact.  Deep tendon reflexes 2+ throughout, toes downgoing.  Finger to nose testing intact.  Gait normal, Romberg negative.   Metta Clines, DO  CC: Debbrah Alar, NP

## 2021-03-03 ENCOUNTER — Other Ambulatory Visit: Payer: Self-pay

## 2021-03-03 ENCOUNTER — Ambulatory Visit: Payer: 59 | Admitting: Neurology

## 2021-03-03 ENCOUNTER — Encounter: Payer: Self-pay | Admitting: Neurology

## 2021-03-03 VITALS — BP 146/79 | HR 68 | Ht 63.0 in | Wt 162.2 lb

## 2021-03-03 DIAGNOSIS — G43009 Migraine without aura, not intractable, without status migrainosus: Secondary | ICD-10-CM

## 2021-03-03 MED ORDER — AIMOVIG 70 MG/ML ~~LOC~~ SOAJ: 70.0000 mg | SUBCUTANEOUS | 11 refills | Status: DC

## 2021-03-03 MED ORDER — NURTEC 75 MG PO TBDP: 75.0000 mg | ORAL_TABLET | Freq: Every day | ORAL | 11 refills | Status: DC | PRN

## 2021-03-03 NOTE — Patient Instructions (Signed)
1 refilled Aimovig and Nurtec

## 2021-07-21 ENCOUNTER — Ambulatory Visit: Payer: 59 | Admitting: Family

## 2021-07-21 VITALS — BP 158/95 | HR 64 | Temp 98.2°F | Resp 18 | Ht 63.0 in | Wt 156.0 lb

## 2021-07-21 DIAGNOSIS — E785 Hyperlipidemia, unspecified: Secondary | ICD-10-CM | POA: Diagnosis not present

## 2021-07-21 DIAGNOSIS — I10 Essential (primary) hypertension: Secondary | ICD-10-CM

## 2021-07-21 DIAGNOSIS — Z1231 Encounter for screening mammogram for malignant neoplasm of breast: Secondary | ICD-10-CM | POA: Diagnosis not present

## 2021-07-21 DIAGNOSIS — G43009 Migraine without aura, not intractable, without status migrainosus: Secondary | ICD-10-CM

## 2021-07-21 LAB — BASIC METABOLIC PANEL
BUN: 12 mg/dL (ref 6–23)
CO2: 31 mEq/L (ref 19–32)
Calcium: 9.7 mg/dL (ref 8.4–10.5)
Chloride: 105 mEq/L (ref 96–112)
Creatinine, Ser: 0.89 mg/dL (ref 0.40–1.20)
GFR: 72.34 mL/min (ref >60.00)
Glucose, Bld: 85 mg/dL (ref 70–99)
Potassium: 3.5 mEq/L (ref 3.5–5.1)
Sodium: 141 mEq/L (ref 135–145)

## 2021-07-21 MED ORDER — LISINOPRIL 40 MG PO TABS: 40.0000 mg | ORAL_TABLET | Freq: Every day | ORAL | 0 refills | Status: DC

## 2021-07-21 NOTE — Assessment & Plan Note (Signed)
BP Readings from Last 3 Encounters:  07/21/21 (!) 158/95  03/03/21 (!) 146/79  01/20/21 138/85   BP remains above goal. Pt reports good compliance with medication. Will increase lisinopril from '20mg'$  to '40mg'$  once daily. Continue current dose of hctz and bystolic. Check bmet.

## 2021-07-21 NOTE — Assessment & Plan Note (Signed)
Lab Results  Component Value Date   CHOL 182 01/20/2021   HDL 36.70 (L) 01/20/2021   LDLCALC 128 (H) 01/20/2021   TRIG 86.0 01/20/2021   CHOLHDL 5 01/20/2021   Last lipid panel was improved.  Plan to recheck next visit.

## 2021-07-21 NOTE — Assessment & Plan Note (Signed)
Stable, managed by neurology.

## 2021-07-21 NOTE — Patient Instructions (Signed)
Please increase lisinopril to 40mg once daily.  

## 2021-07-21 NOTE — Progress Notes (Signed)
Subjective:   By signing my name below, I, Kellie Simmering, attest that this documentation has been prepared under the direction and in the presence of Debbrah Alar, NP 07/21/2021    Patient ID: [name], female    DOB: [birth year], 56 y.o.   MRN: [id number]  Chief Complaint  Patient presents with   Follow-up    6 month    HPI Patient is in today for an office visit.  Blood pressure- Her blood pressure is slightly elevated today. As of today's visit, her reading is 158/98. She reports that she has not regularly  checked her blood pressure this week but states that when she did, levels were normal. She is currently taking Lisinopril 20 mg, Hydrochlorothiazide 25 mg, and 10 mg of Bystolic to manage her blood pressure. She is also taking a potassium chloride 10 meq supplement.  BP Readings from Last 3 Encounters:  07/21/21 (!) 158/95  03/03/21 (!) 146/79  01/20/21 138/85   Pulse Readings from Last 3 Encounters:  07/21/21 64  03/03/21 68  01/20/21 67    Headaches- She reports that she has been managing her headaches well and has been following up with neurology.  Mammogram- She is due for a mammogram. She reports that she has not yet scheduled an appointment at the [facility].   Immunizations- Her tetanus and Covid-19 immunization are UTD.   Colonoscopy- She is UTD on colonoscopy and isn't due for a colonoscopy until 2027.  Past Medical History:  Diagnosis Date   Allergy    Anemia    Arthritis    left knee   Headache(784.0)    Chronic migraines--Dr Lewitt   Hypertension    Meckel's diverticulum 08/2004   with ischemia   Myoma 11/2005   right ovary   Sacroiliac dysfunction 06/23/2012   Uterine adhesions     Past Surgical History:  Procedure Laterality Date   ABDOMINAL HYSTERECTOMY  11/2005   bowel reconstruction     LAPAROSCOPIC SALPINGOOPHERECTOMY  11/2005   right-- assisted with daVinci robot, and lysis of omental adhesions.   LAPAROSCOPY  11/2005    total laparoscopy   OTHER SURGICAL HISTORY     Meckel's diverticulectomy, small bowel resection and resection of retrocecal appendix.    Family History  Problem Relation Age of Onset   Hypertension Other    Hypertension Mother    Hypertension Father    Cancer Neg Hx    Colon cancer Neg Hx    Esophageal cancer Neg Hx    Rectal cancer Neg Hx    Stomach cancer Neg Hx    Pancreatic cancer Neg Hx     Social History   Socioeconomic History   Marital status: Single    Spouse name: Not on file   Number of children: 3   Years of education: Not on file   Highest education level: 12th grade  Occupational History   Occupation: Surveyor, quantity: [employer]  Tobacco Use   Smoking status: Never   Smokeless tobacco: Never  Substance and Sexual Activity   Alcohol use: Yes    Comment: social   Drug use: No   Sexual activity: Yes    Partners: Male  Other Topics Concern   Not on file  Social History Narrative   Lives with daughter (grand-daughter)   Daughter- lives in [place]- lives locally   Works at [employer]   Completed HS   No pets   Enjoys walking, mo  vies, minigolf, bowling      Patient is right-handed. Her daughter and granddaughter live with her in a 2 story house. She drinks one cup of coffee a day in the winter months. She drinks tea occasionally. She walks daily, weather permitting.   Social Determinants of Health   Financial Resource Strain: Not on file  Food Insecurity: Not on file  Transportation Needs: Not on file  Physical Activity: Not on file  Stress: Not on file  Social Connections: Not on file  Intimate Partner Violence: Not on file    Outpatient Medications Prior to Visit  Medication Sig Dispense Refill   Erenumab-aooe (AIMOVIG) 70 MG/ML SOAJ Inject 70 mg into the skin every 28 (twenty-eight) days. 1 mL 11   hydrochlorothiazide (HYDRODIURIL) 25 MG tablet TAKE ONE TABLET BY MOUTH ONE TIME DAILY 90 tablet 0   loratadine (CLARITIN) 10 MG tablet  Take 1 tablet (10 mg total) by mouth daily. 30 tablet 11   nebivolol (BYSTOLIC) 10 MG tablet Take 1 tablet (10 mg total) by mouth daily. 90 tablet 1   potassium chloride (KLOR-CON) 10 MEQ tablet Take 1 tablet (10 mEq total) by mouth daily. 30 tablet 5   Rimegepant Sulfate (NURTEC) 75 MG TBDP Take 75 mg by mouth daily as needed. 8 tablet 11   lisinopril (ZESTRIL) 20 MG tablet TAKE ONE TABLET BY MOUTH ONE TIME DAILY 90 tablet 0   No facility-administered medications prior to visit.    No Known Allergies  ROS     Objective:    Physical Exam Constitutional:      General: She is not in acute distress.    Appearance: Normal appearance. She is not ill-appearing.  HENT:     Head: Normocephalic and atraumatic.     Right Ear: External ear normal.     Left Ear: External ear normal.  Eyes:     Extraocular Movements: Extraocular movements intact.     Pupils: Pupils are equal, round, and reactive to light.  Cardiovascular:     Rate and Rhythm: Normal rate and regular rhythm.     Pulses: Normal pulses.     Heart sounds: Normal heart sounds. No murmur heard.    No gallop.  Pulmonary:     Effort: Pulmonary effort is normal. No respiratory distress.     Breath sounds: Normal breath sounds. No wheezing or rales.  Skin:    General: Skin is warm and dry.  Neurological:     Mental Status: She is alert and oriented to person, place, and time.  Psychiatric:        Mood and Affect: Mood normal.        Behavior: Behavior normal.        Judgment: Judgment normal.     BP (!) 158/95   Pulse 64   Temp 98.2 F (36.8 C)   Resp 18   Ht '5\' 3"'$  (1.6 m)   Wt 156 lb (70.8 kg)   LMP 11/10/2005   SpO2 100%   BMI 27.63 kg/m  Wt Readings from Last 3 Encounters:  07/21/21 156 lb (70.8 kg)  03/03/21 162 lb 3.2 oz (73.6 kg)  01/20/21 159 lb (72.1 kg)       Assessment & Plan:   Problem List Items Addressed This Visit       Unprioritized   Migraine    Stable, managed by neurology.        Relevant Medications   lisinopril (ZESTRIL) 40 MG tablet   Hyperlipidemia  Lab Results  Component Value Date   CHOL 182 01/20/2021   HDL 36.70 (L) 01/20/2021   LDLCALC 128 (H) 01/20/2021   TRIG 86.0 01/20/2021   CHOLHDL 5 01/20/2021  Last lipid panel was improved.  Plan to recheck next visit.       Relevant Medications   lisinopril (ZESTRIL) 40 MG tablet   Other Relevant Orders   Basic metabolic panel   Essential hypertension    BP Readings from Last 3 Encounters:  07/21/21 (!) 158/95  03/03/21 (!) 146/79  01/20/21 138/85  BP remains above goal. Pt reports good compliance with medication. Will increase lisinopril from '20mg'$  to '40mg'$  once daily. Continue current dose of hctz and bystolic. Check bmet.       Relevant Medications   lisinopril (ZESTRIL) 40 MG tablet   Other Visit Diagnoses     Encounter for screening mammogram for malignant neoplasm of breast    -  Primary   Relevant Orders   MM 3D SCREEN BREAST BILATERAL      Meds ordered this encounter  Medications   lisinopril (ZESTRIL) 40 MG tablet    Sig: Take 1 tablet (40 mg total) by mouth daily.    Dispense:  90 tablet    Refill:  0    Order Specific Question:   Supervising Provider    Answer:   Penni Homans A [[id number]]    I, Nance Pear, NP, personally preformed the services described in this documentation.  All medical record entries made by the scribe were at my direction and in my presence.  I have reviewed the chart and discharge instructions (if applicable) and agree that the record reflects my personal 07/21/2021  I,Mohammed Iqbal,acting as a scribe for Nance Pear, NP.,have documented all relevant documentation on the behalf of Nance Pear, NP,as directed by  Nance Pear, NP while in the presence of Nance Pear, NP.  Nance Pear, NP

## 2021-07-28 ENCOUNTER — Ambulatory Visit (HOSPITAL_BASED_OUTPATIENT_CLINIC_OR_DEPARTMENT_OTHER)
Admission: RE | Admit: 2021-07-28 | Discharge: 2021-07-28 | Disposition: A | Discharge status: Home / Self Care | Payer: 59 | Source: Ambulatory Visit | Attending: Family | Admitting: Family

## 2021-07-28 ENCOUNTER — Telehealth: Payer: Self-pay | Admitting: Family

## 2021-07-28 ENCOUNTER — Ambulatory Visit: Payer: 59 | Admitting: Family

## 2021-07-28 ENCOUNTER — Encounter (HOSPITAL_BASED_OUTPATIENT_CLINIC_OR_DEPARTMENT_OTHER): Payer: Self-pay

## 2021-07-28 ENCOUNTER — Encounter: Payer: Self-pay | Admitting: Family

## 2021-07-28 VITALS — BP 125/50 | HR 54 | Temp 98.5°F | Resp 20 | Ht 63.0 in | Wt 153.0 lb

## 2021-07-28 DIAGNOSIS — Z1231 Encounter for screening mammogram for malignant neoplasm of breast: Secondary | ICD-10-CM | POA: Insufficient documentation

## 2021-07-28 DIAGNOSIS — E785 Hyperlipidemia, unspecified: Secondary | ICD-10-CM

## 2021-07-28 DIAGNOSIS — I10 Essential (primary) hypertension: Secondary | ICD-10-CM

## 2021-07-28 LAB — BASIC METABOLIC PANEL
BUN: 24 mg/dL — ABNORMAL HIGH (ref 6–23)
CO2: 30 mEq/L (ref 19–32)
Calcium: 9.5 mg/dL (ref 8.4–10.5)
Chloride: 103 mEq/L (ref 96–112)
Creatinine, Ser: 0.98 mg/dL (ref 0.40–1.20)
GFR: 64.44 mL/min (ref >60.00)
Glucose, Bld: 87 mg/dL (ref 70–99)
Potassium: 3.4 mEq/L — ABNORMAL LOW (ref 3.5–5.1)
Sodium: 143 mEq/L (ref 135–145)

## 2021-07-28 LAB — LIPID PANEL
Cholesterol: 208 mg/dL — ABNORMAL HIGH (ref 0–200)
HDL: 41.7 mg/dL (ref >39.00)
LDL Cholesterol: 151 mg/dL — ABNORMAL HIGH (ref 0–99)
NonHDL: 165.92
Total CHOL/HDL Ratio: 5
Triglycerides: 76 mg/dL (ref 0.0–149.0)
VLDL: 15.2 mg/dL (ref 0.0–40.0)

## 2021-07-28 NOTE — Assessment & Plan Note (Addendum)
BP Readings from Last 3 Encounters:  07/28/21 (!) 125/50  07/21/21 (!) 158/95  03/03/21 (!) 146/79   BP is much improved. DBP a little low. Pt is feeling well. Will continue current dose of lisinopril/HCTZ and obtain follow up bmet.  Pt will check her bp at home for a few days and send me her readings via mychart.

## 2021-07-28 NOTE — Progress Notes (Signed)
Subjective:   By signing my name below, I, Shehryar Baig, attest that this documentation has been prepared under the direction and in the presence of Debbrah Alar, NP. 07/28/2021     Patient ID: [name], female    DOB: [birth year], 56 y.o.   MRN: [id number]  Chief Complaint  Patient presents with   Hypertension   Patient is in today for a office visit.   Hypertension- Her blood pressure has improved since last visit. Her diastolic blood pressure is slightly low. She is willing to monitor her blood pressure for a couple of days and send her results through mychart. She continues taking 25 mg hydrochlorothiazide daily PO, 40 mg lisinopril daily PO and reports doing well while taking them.   BP Readings from Last 3 Encounters:  07/28/21 (!) 125/50  07/21/21 (!) 158/95  03/03/21 (!) 146/79   Pulse Readings from Last 3 Encounters:  07/28/21 (!) 54  07/21/21 64  03/03/21 68    Health Maintenance Due  Topic Date Due   MAMMOGRAM  07/22/2021    Past Medical History:  Diagnosis Date   Allergy    Anemia    Arthritis    left knee   Headache(784.0)    Chronic migraines--Dr Lewitt   Hypertension    Meckel's diverticulum 08/2004   with ischemia   Myoma 11/2005   right ovary   Sacroiliac dysfunction 06/23/2012   Uterine adhesions     Past Surgical History:  Procedure Laterality Date   ABDOMINAL HYSTERECTOMY  11/2005   bowel reconstruction     LAPAROSCOPIC SALPINGOOPHERECTOMY  11/2005   right-- assisted with daVinci robot, and lysis of omental adhesions.   LAPAROSCOPY  11/2005   total laparoscopy   OTHER SURGICAL HISTORY     Meckel's diverticulectomy, small bowel resection and resection of retrocecal appendix.    Family History  Problem Relation Age of Onset   Hypertension Other    Hypertension Mother    Hypertension Father    Cancer Neg Hx    Colon cancer Neg Hx    Esophageal cancer Neg Hx    Rectal cancer Neg Hx    Stomach cancer Neg Hx    Pancreatic  cancer Neg Hx     Social History   Socioeconomic History   Marital status: Single    Spouse name: Not on file   Number of children: 3   Years of education: Not on file   Highest education level: 12th grade  Occupational History   Occupation: Surveyor, quantity: [employer]  Tobacco Use   Smoking status: Never   Smokeless tobacco: Never  Substance and Sexual Activity   Alcohol use: Yes    Comment: social   Drug use: No   Sexual activity: Yes    Partners: Male  Other Topics Concern   Not on file  Social History Narrative   Lives with daughter (grand-daughter)   Daughter- lives in [place]- lives locally   Works at [employer]   Completed HS   No pets   Enjoys walking, mo   vies, Psychologist, clinical, bowling      Patient is right-handed. Her daughter and granddaughter live with her in a 2 story house. She drinks one cup of coffee a day in the winter months. She drinks tea occasionally. She walks daily, weather permitting.   Social Determinants of Health   Financial Resource Strain: Not on file  Food Insecurity: Not on file  Transportation Needs: Not on  file  Physical Activity: Not on file  Stress: Not on file  Social Connections: Not on file  Intimate Partner Violence: Not on file    Outpatient Medications Prior to Visit  Medication Sig Dispense Refill   Erenumab-aooe (AIMOVIG) 70 MG/ML SOAJ Inject 70 mg into the skin every 28 (twenty-eight) days. 1 mL 11   hydrochlorothiazide (HYDRODIURIL) 25 MG tablet TAKE ONE TABLET BY MOUTH ONE TIME DAILY 90 tablet 0   lisinopril (ZESTRIL) 40 MG tablet Take 1 tablet (40 mg total) by mouth daily. 90 tablet 0   loratadine (CLARITIN) 10 MG tablet Take 1 tablet (10 mg total) by mouth daily. 30 tablet 11   nebivolol (BYSTOLIC) 10 MG tablet Take 1 tablet (10 mg total) by mouth daily. 90 tablet 1   potassium chloride (KLOR-CON) 10 MEQ tablet Take 1 tablet (10 mEq total) by mouth daily. 30 tablet 5   Rimegepant Sulfate (NURTEC) 75 MG TBDP Take 75 mg  by mouth daily as needed. 8 tablet 11   No facility-administered medications prior to visit.    No Known Allergies  ROS See HPI    Objective:    Physical Exam Constitutional:      General: She is not in acute distress.    Appearance: Normal appearance. She is not ill-appearing.  HENT:     Head: Normocephalic and atraumatic.     Right Ear: External ear normal.     Left Ear: External ear normal.  Eyes:     Extraocular Movements: Extraocular movements intact.     Pupils: Pupils are equal, round, and reactive to light.  Cardiovascular:     Rate and Rhythm: Normal rate and regular rhythm.     Heart sounds: Normal heart sounds. No murmur heard.    No gallop.  Pulmonary:     Effort: Pulmonary effort is normal. No respiratory distress.     Breath sounds: Normal breath sounds. No wheezing or rales.  Skin:    General: Skin is warm and dry.  Neurological:     Mental Status: She is alert and oriented to person, place, and time.  Psychiatric:        Judgment: Judgment normal.     BP (!) 125/50   Pulse (!) 54   Temp 98.5 F (36.9 C) (Oral)   Resp 20   Ht '5\' 3"'$  (1.6 m)   Wt 153 lb (69.4 kg)   LMP 11/10/2005   SpO2 98%   BMI 27.10 kg/m  Wt Readings from Last 3 Encounters:  07/28/21 153 lb (69.4 kg)  07/21/21 156 lb (70.8 kg)  03/03/21 162 lb 3.2 oz (73.6 kg)       Assessment & Plan:   Problem List Items Addressed This Visit       Unprioritized   Hyperlipidemia    Not currently on statin. Obtain follow up lipid panel.       Relevant Orders   Lipid panel   Essential hypertension - Primary    BP Readings from Last 3 Encounters:  07/28/21 (!) 125/50  07/21/21 (!) 158/95  03/03/21 (!) 146/79  BP is much improved. DBP a little low. Pt is feeling well. Will continue current dose of lisinopril/HCTZ and obtain follow up bmet.  Pt will check her bp at home for a few days and send me her readings via mychart.       Relevant Orders   Basic metabolic panel     No  orders of the defined types were placed in  this encounter.   I, Nance Pear, NP, personally preformed the services described in this documentation.  All medical record entries made by the scribe were at my direction and in my presence.  I have reviewed the chart and discharge instructions (if applicable) and agree that the record reflects my personal performance and is accurate and complete. 07/28/2021   I,Shehryar Baig,acting as a Education administrator for Nance Pear, NP.,have documented all relevant documentation on the behalf of Nance Pear, NP,as directed by  Nance Pear, NP while in the presence of Nance Pear, NP.   Nance Pear, NP

## 2021-07-28 NOTE — Assessment & Plan Note (Signed)
Not currently on statin. Obtain follow up lipid panel.

## 2021-07-28 NOTE — Telephone Encounter (Signed)
Patient advised of results and provider's advise 

## 2021-07-28 NOTE — Telephone Encounter (Signed)
Potassium is mildly low. Please increase potassium supplement to 2 tabs once daily for 3 days, then return to one tab once daily.  Cholesterol is mildly elevated. Please continue your work on low fat/low cholesterol diet/exercise and weight loss.

## 2021-08-02 ENCOUNTER — Telehealth: Payer: Self-pay | Admitting: Family

## 2021-08-02 NOTE — Telephone Encounter (Signed)
Pt called to advise Melissa of the following BP readings:  120/80 on 7.23.23 120/80 on 7.21.23

## 2021-08-02 NOTE — Telephone Encounter (Signed)
See mychart.  

## 2021-08-26 ENCOUNTER — Ambulatory Visit: Payer: 59 | Admitting: Family

## 2021-08-26 ENCOUNTER — Encounter: Payer: Self-pay | Admitting: Family

## 2021-08-26 ENCOUNTER — Other Ambulatory Visit: Payer: Self-pay | Admitting: Family

## 2021-08-26 VITALS — BP 170/100 | HR 69 | Temp 98.6°F | Ht 63.0 in | Wt 154.8 lb

## 2021-08-26 DIAGNOSIS — M5441 Lumbago with sciatica, right side: Secondary | ICD-10-CM

## 2021-08-26 MED ORDER — PREDNISONE 20 MG PO TABS: 40.0000 mg | ORAL_TABLET | Freq: Every day | ORAL | 0 refills | Status: DC

## 2021-08-26 MED ORDER — METHOCARBAMOL 500 MG PO TABS: 500.0000 mg | ORAL_TABLET | Freq: Three times a day (TID) | ORAL | 0 refills | Status: DC | PRN

## 2021-08-26 NOTE — Progress Notes (Signed)
[name] is a 56 y.o. female with the following history as recorded in [employer]:  Patient Active Problem List   Diagnosis Date Noted   Hyperlipidemia 07/15/2020   GERD (gastroesophageal reflux disease) 07/01/2015   Preventative health care 07/25/2014   Migraine 02/15/2008   Essential hypertension 02/14/2008    Current Outpatient Medications  Medication Sig Dispense Refill   Erenumab-aooe (AIMOVIG) 70 MG/ML SOAJ Inject 70 mg into the skin every 28 (twenty-eight) days. 1 mL 11   hydrochlorothiazide (HYDRODIURIL) 25 MG tablet TAKE ONE TABLET BY MOUTH ONE TIME DAILY 90 tablet 0   lisinopril (ZESTRIL) 40 MG tablet Take 1 tablet (40 mg total) by mouth daily. 90 tablet 0   loratadine (CLARITIN) 10 MG tablet Take 1 tablet (10 mg total) by mouth daily. 30 tablet 11   methocarbamol (ROBAXIN) 500 MG tablet Take 1 tablet (500 mg total) by mouth every 8 (eight) hours as needed for muscle spasms. 30 tablet 0   nebivolol (BYSTOLIC) 10 MG tablet Take 1 tablet (10 mg total) by mouth daily. 90 tablet 1   potassium chloride (KLOR-CON) 10 MEQ tablet Take 1 tablet (10 mEq total) by mouth daily. 30 tablet 5   predniSONE (DELTASONE) 20 MG tablet Take 2 tablets (40 mg total) by mouth daily with breakfast. 10 tablet 0   Rimegepant Sulfate (NURTEC) 75 MG TBDP Take 75 mg by mouth daily as needed. 8 tablet 11   No current facility-administered medications for this visit.    Allergies: Patient has no known allergies.  Past Medical History:  Diagnosis Date   Allergy    Anemia    Arthritis    left knee   Headache(784.0)    Chronic migraines--Dr Lewitt   Hypertension    Meckel's diverticulum 08/2004   with ischemia   Myoma 11/2005   right ovary   Sacroiliac dysfunction 06/23/2012   Uterine adhesions     Past Surgical History:  Procedure Laterality Date   ABDOMINAL HYSTERECTOMY  11/2005   bowel reconstruction     LAPAROSCOPIC SALPINGOOPHERECTOMY  11/2005   right-- assisted with daVinci robot, and  lysis of omental adhesions.   LAPAROSCOPY  11/2005   total laparoscopy   OTHER SURGICAL HISTORY     Meckel's diverticulectomy, small bowel resection and resection of retrocecal appendix.    Family History  Problem Relation Age of Onset   Hypertension Other    Hypertension Mother    Hypertension Father    Cancer Neg Hx    Colon cancer Neg Hx    Esophageal cancer Neg Hx    Rectal cancer Neg Hx    Stomach cancer Neg Hx    Pancreatic cancer Neg Hx     Social History   Tobacco Use   Smoking status: Never   Smokeless tobacco: Never  Substance Use Topics   Alcohol use: Yes    Comment: social    Subjective:   Right sided hip/ back pain x 1 week; +radiating symptoms; no OTC medications; no changes in bowel or bladder habits; does heavy lifting at her job at [employer];     Objective:  Vitals:   08/26/21 1502 08/26/21 1648  BP: (!) 170/100 (!) 170/100  Pulse: 69   Temp: 98.6 F (37 C)   TempSrc: Oral   SpO2: 97%   Weight: 154 lb 12.8 oz (70.2 kg)   Height: '5\' 3"'$  (1.6 m)     General: Well developed, well nourished, in mild distress  Skin : Warm and dry.  Head: Normocephalic and atraumatic  Lungs: Respirations unlabored; clear to auscultation bilaterally without wheeze, rales, rhonchi  Musculoskeletal: No deformities; no active joint inflammation  Extremities: No edema, cyanosis, clubbing  Vessels: Symmetric bilaterally  Neurologic: Alert and oriented; speech intact; face symmetrical; moves all extremities well; CNII-XII intact without focal deficit   Assessment:  1. Acute right-sided low back pain with right-sided sciatica     Plan:  Rx for Prednisone and Robaxin; take as directed; work note written as requested; continue to rest and apply heat; follow up worse, no better.   No follow-ups on file.  No orders of the defined types were placed in this encounter.   Requested Prescriptions   Signed Prescriptions Disp Refills   predniSONE (DELTASONE) 20 MG tablet 10  tablet 0    Sig: Take 2 tablets (40 mg total) by mouth daily with breakfast.   methocarbamol (ROBAXIN) 500 MG tablet 30 tablet 0    Sig: Take 1 tablet (500 mg total) by mouth every 8 (eight) hours as needed for muscle spasms.

## 2021-10-17 ENCOUNTER — Other Ambulatory Visit: Payer: Self-pay | Admitting: Family

## 2021-10-29 ENCOUNTER — Ambulatory Visit: Payer: 59 | Admitting: Family

## 2021-11-03 ENCOUNTER — Ambulatory Visit: Payer: 59 | Admitting: Family

## 2021-11-15 ENCOUNTER — Ambulatory Visit (HOSPITAL_BASED_OUTPATIENT_CLINIC_OR_DEPARTMENT_OTHER)
Admission: RE | Admit: 2021-11-15 | Discharge: 2021-11-15 | Disposition: A | Discharge status: Home / Self Care | Payer: 59 | Source: Ambulatory Visit | Attending: Family | Admitting: Family

## 2021-11-15 ENCOUNTER — Ambulatory Visit: Payer: 59 | Admitting: Family

## 2021-11-15 VITALS — BP 150/80 | HR 53 | Temp 97.8°F | Resp 16 | Wt 150.0 lb

## 2021-11-15 DIAGNOSIS — G43009 Migraine without aura, not intractable, without status migrainosus: Secondary | ICD-10-CM | POA: Diagnosis not present

## 2021-11-15 DIAGNOSIS — M79674 Pain in right toe(s): Secondary | ICD-10-CM | POA: Insufficient documentation

## 2021-11-15 DIAGNOSIS — I10 Essential (primary) hypertension: Secondary | ICD-10-CM

## 2021-11-15 DIAGNOSIS — E785 Hyperlipidemia, unspecified: Secondary | ICD-10-CM | POA: Diagnosis not present

## 2021-11-15 LAB — BASIC METABOLIC PANEL
BUN: 22 mg/dL (ref 6–23)
CO2: 30 mEq/L (ref 19–32)
Calcium: 9.6 mg/dL (ref 8.4–10.5)
Chloride: 105 mEq/L (ref 96–112)
Creatinine, Ser: 0.88 mg/dL (ref 0.40–1.20)
GFR: 73.17 mL/min (ref >60.00)
Glucose, Bld: 92 mg/dL (ref 70–99)
Potassium: 3.7 mEq/L (ref 3.5–5.1)
Sodium: 142 mEq/L (ref 135–145)

## 2021-11-15 MED ORDER — VALSARTAN 160 MG PO TABS: ORAL_TABLET | ORAL | 0 refills | Status: DC

## 2021-11-15 NOTE — Progress Notes (Signed)
Subjective:   By signing my name below, I, Shehryar Baig, attest that this documentation has been prepared under the direction and in the presence of Debbrah Alar, NP. 11/15/2021     Patient ID: [name], female    DOB: 09/28/1965, 56 y.o.   MRN: [id number]  Chief Complaint  Patient presents with   Hypertension    Here for follow up   Toe Pain    Pain and swelling on right small toe after injury.    Patient is in today for a follow up visit.   Toe pain-She complains of pain in her right small toe. She reports moving one month ago and hit her toe on a box filled with items. Her pain has not improved since her accident. She has pain daily in her feet while working.   Blood pressure- Her blood pressure is elevated during this visit. She continues taking 25 mg hydrochlorothiazide, 40 mg lisinopril, 10 mg bystrolic and reports no new issues while taking them. She occasionally measures her blood pressure at home and reports it measured 150/80.  BP Readings from Last 3 Encounters:  11/15/21 (!) 150/80  08/26/21 (!) 170/100  07/28/21 (!) 125/50   Pulse Readings from Last 3 Encounters:  11/15/21 (!) 53  08/26/21 69  07/28/21 (!) 54    There are no preventive care reminders to display for this patient.  Past Medical History:  Diagnosis Date   Allergy    Anemia    Arthritis    left knee   Headache(784.0)    Chronic migraines--Dr Lewitt   Hypertension    Meckel's diverticulum 08/2004   with ischemia   Myoma 11/2005   right ovary   Sacroiliac dysfunction 06/23/2012   Uterine adhesions     Past Surgical History:  Procedure Laterality Date   ABDOMINAL HYSTERECTOMY  11/2005   bowel reconstruction     LAPAROSCOPIC SALPINGOOPHERECTOMY  11/2005   right-- assisted with daVinci robot, and lysis of omental adhesions.   LAPAROSCOPY  11/2005   total laparoscopy   OTHER SURGICAL HISTORY     Meckel's diverticulectomy, small bowel resection and resection of retrocecal  appendix.    Family History  Problem Relation Age of Onset   Hypertension Other    Hypertension Mother    Hypertension Father    Cancer Neg Hx    Colon cancer Neg Hx    Esophageal cancer Neg Hx    Rectal cancer Neg Hx    Stomach cancer Neg Hx    Pancreatic cancer Neg Hx     Social History   Socioeconomic History   Marital status: Single    Spouse name: Not on file   Number of children: 3   Years of education: Not on file   Highest education level: 12th grade  Occupational History   Occupation: Surveyor, quantity: [employer]  Tobacco Use   Smoking status: Never   Smokeless tobacco: Never  Substance and Sexual Activity   Alcohol use: Yes    Comment: social   Drug use: No   Sexual activity: Yes    Partners: Male  Other Topics Concern   Not on file  Social History Narrative   Lives with daughter (grand-daughter)   Daughter- lives in [place]- lives locally   Works at [employer]   Completed HS   No pets   Enjoys walking, mo   vies, Psychologist, clinical, bowling      Patient is right-handed. Her daughter and  granddaughter live with her in a 2 story house. She drinks one cup of coffee a day in the winter months. She drinks tea occasionally. She walks daily, weather permitting.   Social Determinants of Health   Financial Resource Strain: Not on file  Food Insecurity: Not on file  Transportation Needs: Not on file  Physical Activity: Not on file  Stress: Not on file  Social Connections: Not on file  Intimate Partner Violence: Not on file    Outpatient Medications Prior to Visit  Medication Sig Dispense Refill   Erenumab-aooe (AIMOVIG) 70 MG/ML SOAJ Inject 70 mg into the skin every 28 (twenty-eight) days. 1 mL 11   hydrochlorothiazide (HYDRODIURIL) 25 MG tablet TAKE ONE TABLET BY MOUTH ONE TIME DAILY 90 tablet 0   loratadine (CLARITIN) 10 MG tablet Take 1 tablet (10 mg total) by mouth daily. 30 tablet 11   methocarbamol (ROBAXIN) 500 MG tablet Take 1 tablet (500 mg total) by  mouth every 8 (eight) hours as needed for muscle spasms. 30 tablet 0   nebivolol (BYSTOLIC) 10 MG tablet Take 1 tablet (10 mg total) by mouth daily. 90 tablet 1   potassium chloride (KLOR-CON) 10 MEQ tablet Take 1 tablet (10 mEq total) by mouth daily. 30 tablet 5   predniSONE (DELTASONE) 20 MG tablet Take 2 tablets (40 mg total) by mouth daily with breakfast. 10 tablet 0   Rimegepant Sulfate (NURTEC) 75 MG TBDP Take 75 mg by mouth daily as needed. 8 tablet 11   lisinopril (ZESTRIL) 40 MG tablet TAKE ONE TABLET BY MOUTH ONE TIME DAILY 90 tablet 0   No facility-administered medications prior to visit.    No Known Allergies  Review of Systems  Musculoskeletal:        (+)tenderness and swelling in right small toe       Objective:    Physical Exam Constitutional:      General: She is not in acute distress.    Appearance: Normal appearance. She is not ill-appearing.  HENT:     Head: Normocephalic and atraumatic.     Right Ear: External ear normal.     Left Ear: External ear normal.  Eyes:     Extraocular Movements: Extraocular movements intact.     Pupils: Pupils are equal, round, and reactive to light.  Cardiovascular:     Rate and Rhythm: Normal rate and regular rhythm.     Heart sounds: Normal heart sounds. No murmur heard.    No gallop.     Comments: Blood pressure measured 148/80 during manual recheck Pulmonary:     Effort: Pulmonary effort is normal. No respiratory distress.     Breath sounds: Normal breath sounds. No wheezing or rales.  Musculoskeletal:     Comments: Mild swelling on right small toe    Skin:    General: Skin is warm and dry.  Neurological:     Mental Status: She is alert and oriented to person, place, and time.  Psychiatric:        Judgment: Judgment normal.     BP (!) 150/80   Pulse (!) 53   Temp 97.8 F (36.6 C) (Oral)   Resp 16   Wt 150 lb (68 kg)   LMP 11/10/2005   SpO2 100%   BMI 26.57 kg/m  Wt Readings from Last 3 Encounters:   11/15/21 150 lb (68 kg)  08/26/21 154 lb 12.8 oz (70.2 kg)  07/28/21 153 lb (69.4 kg)       Assessment &  Plan:   Problem List Items Addressed This Visit       Unprioritized   Pain in right toe(s) - Primary    Suspect that she fractured the right small toe.  Recommended that she buddy tape this toe and also complete x-ray for further evaluation.       Relevant Orders   DG Foot Complete Right   Migraine    Stable, management per neurology.      Relevant Medications   valsartan (DIOVAN) 160 MG tablet   Hyperlipidemia    Lab Results  Component Value Date   CHOL 208 (H) 07/28/2021   HDL 41.70 07/28/2021   LDLCALC 151 (H) 07/28/2021   TRIG 76.0 07/28/2021   CHOLHDL 5 07/28/2021  Last lipid panel mildly elevated. Continue low cholesterol diet.        Relevant Medications   valsartan (DIOVAN) 160 MG tablet   Essential hypertension    BP is elevated on bystolic, lisinopril and hctz.  Will d/c lisinopril and start valsartan '160mg'$  once daily for 3 days, then send me bp readings. Will likely increase to 2 tabs once daily pending review of readings. Check bmet today.       Relevant Medications   valsartan (DIOVAN) 160 MG tablet   Other Relevant Orders   Basic metabolic panel     Meds ordered this encounter  Medications   valsartan (DIOVAN) 160 MG tablet    Sig: 1 tablet by mouth once daily for 3 days, then increase to 2 tabs once daily on week 4.    Dispense:  180 tablet    Refill:  0    Order Specific Question:   Supervising Provider    Answer:   Penni Homans A [[id number]]    I, Nance Pear, NP, personally preformed the services described in this documentation.  All medical record entries made by the scribe were at my direction and in my presence.  I have reviewed the chart and discharge instructions (if applicable) and agree that the record reflects my personal performance and is accurate and complete. 11/15/2021   I,Shehryar Baig,acting as a scribe for  Nance Pear, NP.,have documented all relevant documentation on the behalf of Nance Pear, NP,as directed by  Nance Pear, NP while in the presence of Nance Pear, NP.   Nance Pear, NP

## 2021-11-15 NOTE — Assessment & Plan Note (Signed)
BP is elevated on bystolic, lisinopril and hctz.  Will d/c lisinopril and start valsartan '160mg'$  once daily for 3 days, then send me bp readings. Will likely increase to 2 tabs once daily pending review of readings. Check bmet today.

## 2021-11-15 NOTE — Assessment & Plan Note (Signed)
Suspect that she fractured the right small toe.  Recommended that she buddy tape this toe and also complete x-ray for further evaluation.

## 2021-11-15 NOTE — Assessment & Plan Note (Signed)
Stable, management per neurology.

## 2021-11-15 NOTE — Assessment & Plan Note (Signed)
Lab Results  Component Value Date   CHOL 208 (H) 07/28/2021   HDL 41.70 07/28/2021   LDLCALC 151 (H) 07/28/2021   TRIG 76.0 07/28/2021   CHOLHDL 5 07/28/2021   Last lipid panel mildly elevated. Continue low cholesterol diet.

## 2021-12-08 ENCOUNTER — Ambulatory Visit: Payer: 59 | Admitting: Family

## 2021-12-15 ENCOUNTER — Encounter: Payer: Self-pay | Admitting: Family

## 2021-12-15 ENCOUNTER — Ambulatory Visit: Payer: 59 | Admitting: Family

## 2021-12-15 VITALS — BP 133/76 | HR 52 | Temp 98.0°F | Resp 16 | Ht 63.0 in | Wt 152.5 lb

## 2021-12-15 DIAGNOSIS — K219 Gastro-esophageal reflux disease without esophagitis: Secondary | ICD-10-CM | POA: Diagnosis not present

## 2021-12-15 DIAGNOSIS — M79674 Pain in right toe(s): Secondary | ICD-10-CM | POA: Diagnosis not present

## 2021-12-15 DIAGNOSIS — I10 Essential (primary) hypertension: Secondary | ICD-10-CM

## 2021-12-15 DIAGNOSIS — E785 Hyperlipidemia, unspecified: Secondary | ICD-10-CM | POA: Diagnosis not present

## 2021-12-15 LAB — BASIC METABOLIC PANEL
BUN: 17 mg/dL (ref 6–23)
CO2: 32 mEq/L (ref 19–32)
Calcium: 9.3 mg/dL (ref 8.4–10.5)
Chloride: 104 mEq/L (ref 96–112)
Creatinine, Ser: 0.79 mg/dL (ref 0.40–1.20)
GFR: 83.23 mL/min (ref >60.00)
Glucose, Bld: 65 mg/dL — ABNORMAL LOW (ref 70–99)
Potassium: 3.6 mEq/L (ref 3.5–5.1)
Sodium: 142 mEq/L (ref 135–145)

## 2021-12-15 NOTE — Assessment & Plan Note (Signed)
Reports pain resolved from her right 5th toe fracture.

## 2021-12-15 NOTE — Assessment & Plan Note (Signed)
Stable with diet continue same.

## 2021-12-15 NOTE — Progress Notes (Signed)
Subjective:   By signing my name below, I, Shehryar Baig, attest that this documentation has been prepared under the direction and in the presence of Debbrah Alar, NP. 12/15/2021   Patient ID: [name], female    DOB: October 01, 1965, 56 y.o.   MRN: [id number]  Chief Complaint  Patient presents with   Follow-up    HTN, Pt checks at work, states doing better    HPI Patient is in today for a follow up visit.   Toe pain: Her toe pain has improved since last visit.   Blood pressure: Her blood pressure is doing well during this visit. She continues taking 25 mg hydrochlorothiazide daily PO, 10 me Bystolic, 562 mg Valsartan and reports no new issues while taking them. She continues taking her potassium supplements regularly.  BP Readings from Last 3 Encounters:  12/15/21 133/76  11/15/21 (!) 150/80  08/26/21 (!) 170/100   Pulse Readings from Last 3 Encounters:  12/15/21 (!) 52  11/15/21 (!) 53  08/26/21 69   Cholesterol: Her last cholesterol levels were slightly elevated and she is planning on controlling it through her diet.  Lab Results  Component Value Date   CHOL 208 (H) 07/28/2021   HDL 41.70 07/28/2021   LDLCALC 151 (H) 07/28/2021   TRIG 76.0 07/28/2021   CHOLHDL 5 07/28/2021   Reflux: Her reflux is stable and diet controled.   Immunizations: She received the latest Covid-19 booster vaccine on 11/16/2021 at her pharmacy.    There are no preventive care reminders to display for this patient.   Past Medical History:  Diagnosis Date   Allergy    Anemia    Arthritis    left knee   Headache(784.0)    Chronic migraines--Dr Lewitt   Hypertension    Meckel's diverticulum 08/2004   with ischemia   Myoma 11/2005   right ovary   Sacroiliac dysfunction 06/23/2012   Uterine adhesions     Past Surgical History:  Procedure Laterality Date   ABDOMINAL HYSTERECTOMY  11/2005   bowel reconstruction     LAPAROSCOPIC SALPINGOOPHERECTOMY  11/2005   right-- assisted  with daVinci robot, and lysis of omental adhesions.   LAPAROSCOPY  11/2005   total laparoscopy   OTHER SURGICAL HISTORY     Meckel's diverticulectomy, small bowel resection and resection of retrocecal appendix.    Family History  Problem Relation Age of Onset   Hypertension Other    Hypertension Mother    Hypertension Father    Cancer Neg Hx    Colon cancer Neg Hx    Esophageal cancer Neg Hx    Rectal cancer Neg Hx    Stomach cancer Neg Hx    Pancreatic cancer Neg Hx     Social History   Socioeconomic History   Marital status: Single    Spouse name: Not on file   Number of children: 3   Years of education: Not on file   Highest education level: 12th grade  Occupational History   Occupation: Surveyor, quantity: [employer]  Tobacco Use   Smoking status: Never   Smokeless tobacco: Never  Substance and Sexual Activity   Alcohol use: Yes    Comment: social   Drug use: No   Sexual activity: Yes    Partners: Male  Other Topics Concern   Not on file  Social History Narrative   Lives with daughter (grand-daughter)   Daughter- lives in [place]- lives locally   Works at  [employer]   Completed HS   No pets   Enjoys walking, mo   vies, minigolf, bowling      Patient is right-handed. Her daughter and granddaughter live with her in a 2 story house. She drinks one cup of coffee a day in the winter months. She drinks tea occasionally. She walks daily, weather permitting.   Social Determinants of Health   Financial Resource Strain: Not on file  Food Insecurity: Not on file  Transportation Needs: Not on file  Physical Activity: Not on file  Stress: Not on file  Social Connections: Not on file  Intimate Partner Violence: Not on file    Outpatient Medications Prior to Visit  Medication Sig Dispense Refill   Erenumab-aooe (AIMOVIG) 70 MG/ML SOAJ Inject 70 mg into the skin every 28 (twenty-eight) days. 1 mL 11   hydrochlorothiazide (HYDRODIURIL) 25 MG tablet TAKE ONE  TABLET BY MOUTH ONE TIME DAILY 90 tablet 0   loratadine (CLARITIN) 10 MG tablet Take 1 tablet (10 mg total) by mouth daily. 30 tablet 11   methocarbamol (ROBAXIN) 500 MG tablet Take 1 tablet (500 mg total) by mouth every 8 (eight) hours as needed for muscle spasms. 30 tablet 0   nebivolol (BYSTOLIC) 10 MG tablet Take 1 tablet (10 mg total) by mouth daily. 90 tablet 1   potassium chloride (KLOR-CON) 10 MEQ tablet Take 1 tablet (10 mEq total) by mouth daily. 30 tablet 5   predniSONE (DELTASONE) 20 MG tablet Take 2 tablets (40 mg total) by mouth daily with breakfast. 10 tablet 0   Rimegepant Sulfate (NURTEC) 75 MG TBDP Take 75 mg by mouth daily as needed. 8 tablet 11   valsartan (DIOVAN) 160 MG tablet 1 tablet by mouth once daily for 3 days, then increase to 2 tabs once daily on week 4. 180 tablet 0   No facility-administered medications prior to visit.    No Known Allergies  ROS See HPI    Objective:    Physical Exam Constitutional:      General: She is not in acute distress.    Appearance: Normal appearance. She is not ill-appearing.  HENT:     Head: Normocephalic and atraumatic.     Right Ear: External ear normal.     Left Ear: External ear normal.  Eyes:     Extraocular Movements: Extraocular movements intact.     Pupils: Pupils are equal, round, and reactive to light.  Cardiovascular:     Rate and Rhythm: Normal rate and regular rhythm.     Heart sounds: Normal heart sounds. No murmur heard.    No gallop.  Pulmonary:     Effort: Pulmonary effort is normal. No respiratory distress.     Breath sounds: Normal breath sounds. No wheezing or rales.  Skin:    General: Skin is warm and dry.  Neurological:     Mental Status: She is alert and oriented to person, place, and time.  Psychiatric:        Judgment: Judgment normal.     BP 133/76   Pulse (!) 52   Temp 98 F (36.7 C) (Oral)   Resp 16   Ht '5\' 3"'$  (1.6 m)   Wt 152 lb 8 oz (69.2 kg)   LMP 11/10/2005   SpO2 98%    BMI 27.01 kg/m  Wt Readings from Last 3 Encounters:  12/15/21 152 lb 8 oz (69.2 kg)  11/15/21 150 lb (68 kg)  08/26/21 154 lb 12.8 oz (70.2 kg)  Assessment & Plan:  Essential hypertension Assessment & Plan: Last visit we d/c'd lisinopril and began valsartan.  She continued on bystolic and diovan.  BP now at goal, continue same.  BP Readings from Last 3 Encounters:  12/15/21 133/76  11/15/21 (!) 150/80  08/26/21 (!) 170/100     Orders: -     Basic metabolic panel  Gastroesophageal reflux disease, unspecified whether esophagitis present Assessment & Plan: Stable with diet continue same.    Hyperlipidemia, unspecified hyperlipidemia type Assessment & Plan: Lab Results  Component Value Date   CHOL 208 (H) 07/28/2021   HDL 41.70 07/28/2021   LDLCALC 151 (H) 07/28/2021   TRIG 76.0 07/28/2021   CHOLHDL 5 07/28/2021   Continue to work on low cholesterol diet.    Pain in right toe(s) Assessment & Plan: Reports pain resolved from her right 5th toe fracture.      I, Nance Pear, NP, personally preformed the services described in this documentation.  All medical record entries made by the scribe were at my direction and in my presence.  I have reviewed the chart and discharge instructions (if applicable) and agree that the record reflects my personal performance and is accurate and complete. 12/15/2021   I,Shehryar Baig,acting as a Education administrator for Nance Pear, NP.,have documented all relevant documentation on the behalf of Nance Pear, NP,as directed by  Nance Pear, NP while in the presence of Nance Pear, NP.   Nance Pear, NP

## 2021-12-15 NOTE — Assessment & Plan Note (Signed)
Lab Results  Component Value Date   CHOL 208 (H) 07/28/2021   HDL 41.70 07/28/2021   LDLCALC 151 (H) 07/28/2021   TRIG 76.0 07/28/2021   CHOLHDL 5 07/28/2021   Continue to work on low cholesterol diet.

## 2021-12-15 NOTE — Assessment & Plan Note (Addendum)
Last visit we d/c'd lisinopril and began valsartan.  She continued on bystolic and diovan.  BP now at goal, continue same.  BP Readings from Last 3 Encounters:  12/15/21 133/76  11/15/21 (!) 150/80  08/26/21 (!) 170/100

## 2022-02-11 ENCOUNTER — Ambulatory Visit: Payer: 59 | Admitting: Family

## 2022-02-11 VITALS — BP 165/82 | HR 70 | Temp 98.3°F | Resp 16 | Wt 164.0 lb

## 2022-02-11 DIAGNOSIS — I10 Essential (primary) hypertension: Secondary | ICD-10-CM | POA: Diagnosis not present

## 2022-02-11 DIAGNOSIS — M5441 Lumbago with sciatica, right side: Secondary | ICD-10-CM | POA: Diagnosis not present

## 2022-02-11 MED ORDER — METHYLPREDNISOLONE 4 MG PO TBPK: ORAL_TABLET | ORAL | 0 refills | Status: DC

## 2022-02-11 MED ORDER — NEBIVOLOL HCL 10 MG PO TABS: 20.0000 mg | ORAL_TABLET | Freq: Every day | ORAL | 1 refills | Status: DC

## 2022-02-11 NOTE — Assessment & Plan Note (Addendum)
BP Readings from Last 3 Encounters:  02/11/22 (!) 165/82  12/15/21 133/76  11/15/21 (!) 150/80   BP above goal. Will increase bystolic from '10mg'$  to '20mg'$ . Continue hctz and valsartan. Will increase bystolic from '10mg'$  to '20mg'$ .

## 2022-02-11 NOTE — Progress Notes (Signed)
Subjective:     Patient ID: [name], female    DOB: [birth year], 57 y.o.   MRN: [id number]  Chief Complaint  Patient presents with   Back Pain    Complains of back pain for about 2 weeks, more on the right side    HPI Patient is in today with chief complaint of low back pain. Pain began 2 weeks ago and is worst on the right hand side.  Pain radiates into the right buttock and around into the right groin.  Has seen chiropractor and and was told that she "twisted his pelvis."  She received an adjustment for this last year which was helpful.  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 She denies bowel/bladder incontinence or LE weakness  There are no preventive care reminders to display for this patient.  Past Medical History:  Diagnosis Date   Allergy    Anemia    Arthritis    left knee   Headache(784.0)    Chronic migraines--Dr Lewitt   Hypertension    Meckel's diverticulum 08/2004   with ischemia   Myoma 11/2005   right ovary   Sacroiliac dysfunction 06/23/2012   Uterine adhesions     Past Surgical History:  Procedure Laterality Date   ABDOMINAL HYSTERECTOMY  11/2005   bowel reconstruction     LAPAROSCOPIC SALPINGOOPHERECTOMY  11/2005   right-- assisted with daVinci robot, and lysis of omental adhesions.   LAPAROSCOPY  11/2005   total laparoscopy   OTHER SURGICAL HISTORY     Meckel's diverticulectomy, small bowel resection and resection of retrocecal appendix.    Family History  Problem Relation Age of Onset   Hypertension Other    Hypertension Mother    Hypertension Father    Cancer Neg Hx    Colon cancer Neg Hx    Esophageal cancer Neg Hx    Rectal cancer Neg Hx    Stomach cancer Neg Hx    Pancreatic cancer Neg Hx     Social History   Socioeconomic History   Marital status: Single    Spouse name: Not on file   Number of children: 3   Years of education: Not on file   Highest education level: 12th grade  Occupational History    Occupation: Surveyor, quantity: [employer]  Tobacco Use   Smoking status: Never   Smokeless tobacco: Never  Substance and Sexual Activity   Alcohol use: Yes    Comment: social   Drug use: No   Sexual activity: Yes    Partners: Male  Other Topics Concern   Not on file  Social History Narrative   Lives with daughter (grand-daughter)   Daughter- lives in [place]- lives locally   Works at [employer]   Completed HS   No pets   Enjoys walking, mo   vies, Psychologist, clinical, bowling      Patient is right-handed. Her daughter and granddaughter live with her in a 2 story house. She drinks one cup of coffee a day in the winter months. She drinks tea occasionally. She walks daily, weather permitting.   Social Determinants of Health   Financial Resource Strain: Not on file  Food Insecurity: Not on file  Transportation Needs: Not on file  Physical Activity: Not on file  Stress: Not on file  Social Connections: Not on file  Intimate Partner Violence: Not on file    Outpatient Medications Prior to Visit  Medication Sig Dispense  Refill   Erenumab-aooe (AIMOVIG) 70 MG/ML SOAJ Inject 70 mg into the skin every 28 (twenty-eight) days. 1 mL 11   hydrochlorothiazide (HYDRODIURIL) 25 MG tablet TAKE ONE TABLET BY MOUTH ONE TIME DAILY 90 tablet 0   loratadine (CLARITIN) 10 MG tablet Take 1 tablet (10 mg total) by mouth daily. 30 tablet 11   methocarbamol (ROBAXIN) 500 MG tablet Take 1 tablet (500 mg total) by mouth every 8 (eight) hours as needed for muscle spasms. 30 tablet 0   potassium chloride (KLOR-CON) 10 MEQ tablet Take 1 tablet (10 mEq total) by mouth daily. 30 tablet 5   Rimegepant Sulfate (NURTEC) 75 MG TBDP Take 75 mg by mouth daily as needed. 8 tablet 11   valsartan (DIOVAN) 160 MG tablet 1 tablet by mouth once daily for 3 days, then increase to 2 tabs once daily on week 4. 180 tablet 0   nebivolol (BYSTOLIC) 10 MG tablet Take 1 tablet (10 mg total) by mouth daily. 90 tablet 1   predniSONE  (DELTASONE) 20 MG tablet Take 2 tablets (40 mg total) by mouth daily with breakfast. 10 tablet 0   No facility-administered medications prior to visit.    No Known Allergies  ROS See HPI    Objective:    Physical Exam Constitutional:      General: She is not in acute distress.    Appearance: Normal appearance. She is well-developed.  HENT:     Head: Normocephalic and atraumatic.     Right Ear: External ear normal.     Left Ear: External ear normal.  Eyes:     General: No scleral icterus. Neck:     Thyroid: No thyromegaly.  Cardiovascular:     Rate and Rhythm: Normal rate and regular rhythm.     Heart sounds: Normal heart sounds. No murmur heard. Pulmonary:     Effort: Pulmonary effort is normal. No respiratory distress.     Breath sounds: Normal breath sounds. No wheezing.  Musculoskeletal:     Cervical back: Neck supple.  Skin:    General: Skin is warm and dry.  Neurological:     Mental Status: She is alert and oriented to person, place, and time.     Deep Tendon Reflexes:     Reflex Scores:      Patellar reflexes are 3+ on the right side and 3+ on the left side.    Comments: Bilateral LE strength is 5/5  Psychiatric:        Mood and Affect: Mood normal.        Behavior: Behavior normal.        Thought Content: Thought content normal.        Judgment: Judgment normal.     BP (!) 165/82   Pulse 70   Temp 98.3 F (36.8 C) (Oral)   Resp 16   Wt 164 lb (74.4 kg)   LMP 11/10/2005   SpO2 99%   BMI 29.05 kg/m  Wt Readings from Last 3 Encounters:  02/11/22 164 lb (74.4 kg)  12/15/21 152 lb 8 oz (69.2 kg)  11/15/21 150 lb (68 kg)       Assessment & Plan:   Problem List Items Addressed This Visit       Unprioritized   Essential hypertension - Primary    BP Readings from Last 3 Encounters:  02/11/22 (!) 165/82  12/15/21 133/76  11/15/21 (!) 150/80  BP above goal. Will increase bystolic from '10mg'$  to '20mg'$ . Continue hctz and valsartan.  Will increase  bystolic from '10mg'$  to '20mg'$ .       Relevant Medications   nebivolol (BYSTOLIC) 10 MG tablet   Acute right-sided low back pain with right-sided sciatica    Uncontrolled. Trial of medrol dose pak, robaxin prn.       Relevant Medications   methylPREDNISolone (MEDROL DOSEPAK) 4 MG TBPK tablet    I have discontinued Sabrie E. Bushart's predniSONE. I have also changed her nebivolol. Additionally, I am having her start on methylPREDNISolone. Lastly, I am having her maintain her loratadine, potassium chloride, hydrochlorothiazide, Nurtec, Aimovig, methocarbamol, and valsartan.  Meds ordered this encounter  Medications   nebivolol (BYSTOLIC) 10 MG tablet    Sig: Take 2 tablets (20 mg total) by mouth daily.    Dispense:  90 tablet    Refill:  1    Order Specific Question:   Supervising Provider    Answer:   Penni Homans A [[id number]]   methylPREDNISolone (MEDROL DOSEPAK) 4 MG TBPK tablet    Sig: Take per package instructions.    Dispense:  21 tablet    Refill:  0    Order Specific Question:   Supervising Provider    Answer:   Penni Homans A [[id number]]

## 2022-02-11 NOTE — Assessment & Plan Note (Signed)
Uncontrolled. Trial of medrol dose pak, robaxin prn.

## 2022-02-25 ENCOUNTER — Ambulatory Visit: Payer: 59 | Admitting: Family

## 2022-02-25 VITALS — BP 144/75 | HR 58 | Temp 98.0°F | Resp 16 | Wt 166.0 lb

## 2022-02-25 DIAGNOSIS — R351 Nocturia: Secondary | ICD-10-CM | POA: Insufficient documentation

## 2022-02-25 DIAGNOSIS — I10 Essential (primary) hypertension: Secondary | ICD-10-CM

## 2022-02-25 DIAGNOSIS — M5441 Lumbago with sciatica, right side: Secondary | ICD-10-CM

## 2022-02-25 MED ORDER — HYDRALAZINE HCL 25 MG PO TABS: 25.0000 mg | ORAL_TABLET | Freq: Two times a day (BID) | ORAL | 5 refills | Status: DC

## 2022-02-25 NOTE — Assessment & Plan Note (Signed)
Resolved following steroid taper. Monitor.

## 2022-02-25 NOTE — Progress Notes (Signed)
Subjective:   By signing my name below, I, Shehryar Baig, attest that this documentation has been prepared under the direction and in the presence of Debbrah Alar, NP. 02/25/2022   Patient ID: [name], female    DOB: 08/25/1965, 57 y.o.   MRN: [id number]  Chief Complaint  Patient presents with   Hypertension    Here for follow up after medication change    HPI Patient is in today for an office visit.   Nocturia: She is waking up every 2-3 hours every night to urinate. She has not changed his liquid intake pattern. She drinks water before bed and keeps water beside her while sleeping incase she wants to drink more.   Back pain: She was complaining of low back pain and sciatic and was given prednisone. She reports finding relief after taking it.   Blood pressure: Her blood pressure was elevated during that visit so her Bystolic was increased from 10 to 20 mg daily. Her blood pressure has improved but is still elevated. She continues taking 160 mg Valsartan 2x daily PO, 25 mg hydrochlorothiazide daily PO and reports no new issues while taking them.  BP Readings from Last 3 Encounters:  02/25/22 (!) 144/75  02/11/22 (!) 165/82  12/15/21 133/76   Pulse Readings from Last 3 Encounters:  02/25/22 (!) 58  02/11/22 70  12/15/21 (!) 52    Past Medical History:  Diagnosis Date   Allergy    Anemia    Arthritis    left knee   Headache(784.0)    Chronic migraines--Dr Lewitt   Hypertension    Meckel's diverticulum 08/2004   with ischemia   Myoma 11/2005   right ovary   Sacroiliac dysfunction 06/23/2012   Uterine adhesions     Past Surgical History:  Procedure Laterality Date   ABDOMINAL HYSTERECTOMY  11/2005   bowel reconstruction     LAPAROSCOPIC SALPINGOOPHERECTOMY  11/2005   right-- assisted with daVinci robot, and lysis of omental adhesions.   LAPAROSCOPY  11/2005   total laparoscopy   OTHER SURGICAL HISTORY     Meckel's diverticulectomy, small bowel resection  and resection of retrocecal appendix.    Family History  Problem Relation Age of Onset   Hypertension Other    Hypertension Mother    Hypertension Father    Cancer Neg Hx    Colon cancer Neg Hx    Esophageal cancer Neg Hx    Rectal cancer Neg Hx    Stomach cancer Neg Hx    Pancreatic cancer Neg Hx     Social History   Socioeconomic History   Marital status: Single    Spouse name: Not on file   Number of children: 3   Years of education: Not on file   Highest education level: 12th grade  Occupational History   Occupation: Surveyor, quantity: [employer]  Tobacco Use   Smoking status: Never   Smokeless tobacco: Never  Substance and Sexual Activity   Alcohol use: Yes    Comment: social   Drug use: No   Sexual activity: Yes    Partners: Male  Other Topics Concern   Not on file  Social History Narrative   Lives with daughter (grand-daughter)   Daughter- lives in [place]- lives locally   Works at [employer]   Completed HS   No pets   Enjoys walking, mo   vies, Psychologist, clinical, bowling      Patient is right-handed. Her daughter and  granddaughter live with her in a 2 story house. She drinks one cup of coffee a day in the winter months. She drinks tea occasionally. She walks daily, weather permitting.   Social Determinants of Health   Financial Resource Strain: Not on file  Food Insecurity: Not on file  Transportation Needs: Not on file  Physical Activity: Not on file  Stress: Not on file  Social Connections: Not on file  Intimate Partner Violence: Not on file    Outpatient Medications Prior to Visit  Medication Sig Dispense Refill   Erenumab-aooe (AIMOVIG) 70 MG/ML SOAJ Inject 70 mg into the skin every 28 (twenty-eight) days. 1 mL 11   hydrochlorothiazide (HYDRODIURIL) 25 MG tablet TAKE ONE TABLET BY MOUTH ONE TIME DAILY 90 tablet 0   loratadine (CLARITIN) 10 MG tablet Take 1 tablet (10 mg total) by mouth daily. 30 tablet 11   methocarbamol (ROBAXIN) 500 MG tablet  Take 1 tablet (500 mg total) by mouth every 8 (eight) hours as needed for muscle spasms. 30 tablet 0   methylPREDNISolone (MEDROL DOSEPAK) 4 MG TBPK tablet Take per package instructions. 21 tablet 0   nebivolol (BYSTOLIC) 10 MG tablet Take 2 tablets (20 mg total) by mouth daily. 90 tablet 1   potassium chloride (KLOR-CON) 10 MEQ tablet Take 1 tablet (10 mEq total) by mouth daily. 30 tablet 5   Rimegepant Sulfate (NURTEC) 75 MG TBDP Take 75 mg by mouth daily as needed. 8 tablet 11   valsartan (DIOVAN) 160 MG tablet 1 tablet by mouth once daily for 3 days, then increase to 2 tabs once daily on week 4. 180 tablet 0   No facility-administered medications prior to visit.    No Known Allergies  Review of Systems  Genitourinary:        (+)nocturia       Objective:    Physical Exam Constitutional:      General: She is not in acute distress.    Appearance: Normal appearance.  HENT:     Head: Normocephalic and atraumatic.     Right Ear: External ear normal.     Left Ear: External ear normal.  Eyes:     Extraocular Movements: Extraocular movements intact.     Pupils: Pupils are equal, round, and reactive to light.  Cardiovascular:     Rate and Rhythm: Normal rate and regular rhythm.     Heart sounds: No murmur heard.    No gallop.  Pulmonary:     Effort: Pulmonary effort is normal. No respiratory distress.     Breath sounds: Normal breath sounds. No wheezing or rales.  Lymphadenopathy:     Cervical: No cervical adenopathy.  Skin:    General: Skin is warm.  Neurological:     Mental Status: She is alert and oriented to person, place, and time.  Psychiatric:        Judgment: Judgment normal.     BP (!) 144/75 (BP Location: Left Arm, Patient Position: Sitting, Cuff Size: Small)   Pulse (!) 58   Temp 98 F (36.7 C) (Oral)   Resp 16   Wt 166 lb (75.3 kg)   LMP 11/10/2005   SpO2 100%   BMI 29.41 kg/m  Wt Readings from Last 3 Encounters:  02/25/22 166 lb (75.3 kg)  02/11/22  164 lb (74.4 kg)  12/15/21 152 lb 8 oz (69.2 kg)       Assessment & Plan:  Essential hypertension Assessment & Plan: BP is improved but still above goal.  At max dose of diovan.  On HCTZ, bystolic.  HR too low to further increase bystolic. Will add hydralazine bid to start off.    Acute right-sided low back pain with right-sided sciatica Assessment & Plan: Resolved following steroid taper. Monitor.    Nocturia Assessment & Plan: We discussed cutting off fluids 90 minutes before bedtime to see if that helps.    Other orders -     hydrALAZINE HCl; Take 1 tablet (25 mg total) by mouth in the morning and at bedtime.  Dispense: 60 tablet; Refill: 5    I, Nance Pear, NP, personally preformed the services described in this documentation.  All medical record entries made by the scribe were at my direction and in my presence.  I have reviewed the chart and discharge instructions (if applicable) and agree that the record reflects my personal performance and is accurate and complete. 02/25/2022  I,Shehryar Baig,acting as a scribe for Nance Pear, NP.,have documented all relevant documentation on the behalf of Nance Pear, NP,as directed by  Nance Pear, NP while in the presence of Nance Pear, NP.  Nance Pear, NP

## 2022-02-25 NOTE — Assessment & Plan Note (Signed)
BP is improved but still above goal.  At max dose of diovan.  On HCTZ, bystolic.  HR too low to further increase bystolic. Will add hydralazine bid to start off.

## 2022-02-25 NOTE — Assessment & Plan Note (Addendum)
New. We discussed cutting off fluids 90 minutes before bedtime to see if that helps.

## 2022-03-01 NOTE — Progress Notes (Unsigned)
NEUROLOGY FOLLOW UP OFFICE NOTE  [name] [id number]  Assessment/Plan:   Migraine without aura, without status migrainosus, not intractable   1.  Migraine prevention:  Aimovig 82m Q28d *** 2.  Migraine rescue:  Nurtec 730m*** 3.  Limit use of pain relievers to no more than 2 days out of week to prevent risk of rebound or medication-overuse headache. 4.  Keep headache diary 5.  Follow up 1 year ***   Subjective:  [name] a [age]ear old right-handed female with hypertension who follows up for migranes.   UPDATE: Intensity:  mild Duration:  A couple of hours with Nurtec Frequency:  1 in past 30 days. Current NSAIDS: none Current analgesics: None Current triptans: none Current ergotamine: None Current anti-emetic: None Current muscle relaxants: None Current anti-anxiolytic: None Current sleep aide: None Current Antihypertensive medications: Lisinopril, Bystolic, HCTZ Current Antidepressant medications: None Current Anticonvulsant medications: None Current anti-CGRP: Aimovig 7020mvery 28 days; Nurtec rescue Current Vitamins/Herbal/Supplements: None Current Antihistamines/Decongestants: None Other therapy: None Remote/birth control: None   Caffeine: Rarely coffee or sweet tea Alcohol: No Smoker: No Diet: Hydrates.  No soda Exercise: Walks daily Depression: No; Anxiety: No Other pain: No Sleep hygiene: Good   HISTORY:  Onset: In her mid 20s56scation:  Unilateral either side (retro-orbital to back of head, right greater than left) and into neck Quality:  throbbing Initial intensity:  Mild-moderate and severe.  She denies new headache, thunderclap headache or severe headache that wakes from sleep. Aura:  no Prodrome:  no Postdrome:  no Associated symptoms: Conjunctival injection, dizziness, nausea, photophobia, phonophobia, blurred vision.  She denies associated unilateral numbness or weakness. Initial duration:  Mild-moderate:  1/2 day; Severe: 3 days.  Often wakes up with migraine. Initial Frequency:  Mild-moderate:  2 days a month; Severe: 1 every 3 months Initial Frequency of abortive medication: 2 days a month Triggers: Hot dogs Relieving factors: Heating pad, cold pack or hot shower on neck Activity:  Severe aggravates   Past NSAIDS:  ibuprofen, naproxen Past analgesics:  Excedrin Migraine, Tylenol Past abortive triptans:  Sumatriptan tablet; Relpax 40 mg, Zomig NS (not covered by insurance) Past muscle relaxants:  Flexeril, Robaxin Past anti-emetic:  no Past antihypertensive medications:  no Past antidepressant medications:  no Past anticonvulsant medications:  topiramate (not sure why taken off) Past vitamins/Herbal/Supplements:  no Past antihistamines/decongestants:  no Other past therapies:  no   Family history of headache:  no    PAST MEDICAL HISTORY: Past Medical History:  Diagnosis Date   Allergy    Anemia    Arthritis    left knee   Headache(784.0)    Chronic migraines--Dr Lewitt   Hypertension    Meckel's diverticulum 08/2004   with ischemia   Myoma 11/2005   right ovary   Sacroiliac dysfunction 06/23/2012   Uterine adhesions     MEDICATIONS: Current Outpatient Medications on File Prior to Visit  Medication Sig Dispense Refill   Erenumab-aooe (AIMOVIG) 70 MG/ML SOAJ Inject 70 mg into the skin every 28 (twenty-eight) days. 1 mL 11   hydrALAZINE (APRESOLINE) 25 MG tablet Take 1 tablet (25 mg total) by mouth in the morning and at bedtime. 60 tablet 5   hydrochlorothiazide (HYDRODIURIL) 25 MG tablet TAKE ONE TABLET BY MOUTH ONE TIME DAILY 90 tablet 0   loratadine (CLARITIN) 10 MG tablet Take 1 tablet (10 mg total) by mouth daily. 30 tablet 11   methocarbamol (ROBAXIN) 500 MG tablet Take 1 tablet (500 mg total) by  mouth every 8 (eight) hours as needed for muscle spasms. 30 tablet 0   methylPREDNISolone (MEDROL DOSEPAK) 4 MG TBPK tablet Take per package instructions. 21 tablet 0   nebivolol (BYSTOLIC) 10 MG  tablet Take 2 tablets (20 mg total) by mouth daily. 90 tablet 1   potassium chloride (KLOR-CON) 10 MEQ tablet Take 1 tablet (10 mEq total) by mouth daily. 30 tablet 5   Rimegepant Sulfate (NURTEC) 75 MG TBDP Take 75 mg by mouth daily as needed. 8 tablet 11   valsartan (DIOVAN) 160 MG tablet 1 tablet by mouth once daily for 3 days, then increase to 2 tabs once daily on week 4. 180 tablet 0   No current facility-administered medications on file prior to visit.    ALLERGIES: No Known Allergies  FAMILY HISTORY: Family History  Problem Relation Age of Onset   Hypertension Other    Hypertension Mother    Hypertension Father    Cancer Neg Hx    Colon cancer Neg Hx    Esophageal cancer Neg Hx    Rectal cancer Neg Hx    Stomach cancer Neg Hx    Pancreatic cancer Neg Hx       Objective:  *** General: No acute distress.  Patient appears well-groomed.   Head:  Normocephalic/atraumatic Eyes:  Fundi examined but not visualized Neurological Exam: alert and oriented to person, place, and time.  Speech fluent and not dysarthric, language intact.  CN II-XII intact. Bulk and tone normal, muscle strength 5/5 throughout.  Sensation to light touch intact.  Deep tendon reflexes 2+ throughout.  Finger to nose testing intact.  Gait normal, Romberg negative.   Samantha Clines, DO  CC: Samantha Alar, NP

## 2022-03-02 ENCOUNTER — Ambulatory Visit: Payer: 59 | Admitting: Neurology

## 2022-03-02 ENCOUNTER — Encounter: Payer: Self-pay | Admitting: Neurology

## 2022-03-02 VITALS — BP 138/72 | HR 74 | Ht 63.0 in | Wt 166.0 lb

## 2022-03-02 DIAGNOSIS — G43009 Migraine without aura, not intractable, without status migrainosus: Secondary | ICD-10-CM

## 2022-03-02 DIAGNOSIS — I10 Essential (primary) hypertension: Secondary | ICD-10-CM | POA: Diagnosis not present

## 2022-03-02 MED ORDER — NURTEC 75 MG PO TBDP: 75.0000 mg | ORAL_TABLET | Freq: Every day | ORAL | 11 refills | Status: DC | PRN

## 2022-03-02 MED ORDER — AIMOVIG 70 MG/ML ~~LOC~~ SOAJ: 70.0000 mg | SUBCUTANEOUS | 11 refills | Status: DC

## 2022-03-07 ENCOUNTER — Telehealth: Payer: Self-pay

## 2022-03-07 NOTE — Telephone Encounter (Signed)
Patient called in stating that the Rimegepant Sulfate (NURTEC) 75 MG TBDP is now $50 and wondered if we have a coupon card that can lower the cost.

## 2022-03-08 NOTE — Telephone Encounter (Signed)
Patient may come by to pick up and pick up copay card or go on the website to get one from there.

## 2022-03-11 ENCOUNTER — Ambulatory Visit: Payer: 59 | Admitting: Family

## 2022-03-11 VITALS — BP 166/80 | HR 65 | Temp 97.9°F | Resp 16 | Wt 170.0 lb

## 2022-03-11 DIAGNOSIS — I10 Essential (primary) hypertension: Secondary | ICD-10-CM

## 2022-03-11 MED ORDER — HYDRALAZINE HCL 25 MG PO TABS: 25.0000 mg | ORAL_TABLET | Freq: Three times a day (TID) | ORAL | 1 refills | Status: DC

## 2022-03-11 MED ORDER — HYDROCHLOROTHIAZIDE 25 MG PO TABS: 25.0000 mg | ORAL_TABLET | Freq: Every day | ORAL | 1 refills | Status: DC

## 2022-03-11 MED ORDER — CARVEDILOL 12.5 MG PO TABS: 12.5000 mg | ORAL_TABLET | Freq: Two times a day (BID) | ORAL | 3 refills | Status: DC

## 2022-03-11 NOTE — Progress Notes (Addendum)
Subjective:   By signing my name below, I, Shehryar Baig, attest that this documentation has been prepared under the direction and in the presence of Debbrah Alar, NP. 03/11/2022   Patient ID: [name], female    DOB: 19-Mar-1965, 57 y.o.   MRN: [id number]  Chief Complaint  Patient presents with   Hypertension    Here for follow up     Patient is in today for a follow up visit.   Blood pressure: Her blood pressure is elevated during this visit. She measured her blood pressure prior to this visit and reports it measured 161/83. She reports getting other similar readings while at home. She started taking 25 mg hydralazine 2x daily PO since her last visit. She continues taking 25 mg hydrochlorothiazide daily PO, 160 mg Valsartan BID PO. She is requesting a refill on 25 mg hydrochlorothiazide and 25 mg hydralazine as well.  BP Readings from Last 3 Encounters:  03/11/22 (!) 166/80  03/02/22 138/72  02/25/22 (!) 144/75   Pulse Readings from Last 3 Encounters:  03/11/22 65  03/02/22 74  02/25/22 (!) 58     Past Medical History:  Diagnosis Date   Allergy    Anemia    Arthritis    left knee   Headache(784.0)    Chronic migraines--Dr Lewitt   Hypertension    Meckel's diverticulum 08/2004   with ischemia   Myoma 11/2005   right ovary   Sacroiliac dysfunction 06/23/2012   Uterine adhesions     Past Surgical History:  Procedure Laterality Date   ABDOMINAL HYSTERECTOMY  11/2005   bowel reconstruction     LAPAROSCOPIC SALPINGOOPHERECTOMY  11/2005   right-- assisted with daVinci robot, and lysis of omental adhesions.   LAPAROSCOPY  11/2005   total laparoscopy   OTHER SURGICAL HISTORY     Meckel's diverticulectomy, small bowel resection and resection of retrocecal appendix.    Family History  Problem Relation Age of Onset   Hypertension Other    Hypertension Mother    Hypertension Father    Cancer Neg Hx    Colon cancer Neg Hx    Esophageal cancer Neg Hx     Rectal cancer Neg Hx    Stomach cancer Neg Hx    Pancreatic cancer Neg Hx     Social History   Socioeconomic History   Marital status: Single    Spouse name: Not on file   Number of children: 3   Years of education: Not on file   Highest education level: 12th grade  Occupational History   Occupation: Surveyor, quantity: [employer]  Tobacco Use   Smoking status: Never   Smokeless tobacco: Never  Substance and Sexual Activity   Alcohol use: Yes    Comment: social   Drug use: No   Sexual activity: Yes    Partners: Male  Other Topics Concern   Not on file  Social History Narrative   Lives with daughter (grand-daughter)   Daughter- lives in [place]- lives locally   Works at [employer]   Completed HS   No pets   Enjoys walking, mo   vies, Psychologist, clinical, bowling      Patient is right-handed. Her daughter and granddaughter live with her in a 2 story house. She drinks one cup of coffee a day in the winter months. She drinks tea occasionally. She walks daily, weather permitting.   Social Determinants of Health   Financial Resource Strain: Not on file  Food Insecurity: Not on file  Transportation Needs: Not on file  Physical Activity: Not on file  Stress: Not on file  Social Connections: Not on file  Intimate Partner Violence: Not on file    Outpatient Medications Prior to Visit  Medication Sig Dispense Refill   Erenumab-aooe (AIMOVIG) 70 MG/ML SOAJ Inject 70 mg into the skin every 28 (twenty-eight) days. 1 mL 11   loratadine (CLARITIN) 10 MG tablet Take 1 tablet (10 mg total) by mouth daily. 30 tablet 11   methocarbamol (ROBAXIN) 500 MG tablet Take 1 tablet (500 mg total) by mouth every 8 (eight) hours as needed for muscle spasms. 30 tablet 0   potassium chloride (KLOR-CON) 10 MEQ tablet Take 1 tablet (10 mEq total) by mouth daily. 30 tablet 5   Rimegepant Sulfate (NURTEC) 75 MG TBDP Take 1 tablet (75 mg total) by mouth daily as needed. 8 tablet 11   valsartan (DIOVAN) 160 MG  tablet 1 tablet by mouth once daily for 3 days, then increase to 2 tabs once daily on week 4. 180 tablet 0   hydrALAZINE (APRESOLINE) 25 MG tablet Take 1 tablet (25 mg total) by mouth in the morning and at bedtime. 60 tablet 5   hydrochlorothiazide (HYDRODIURIL) 25 MG tablet TAKE ONE TABLET BY MOUTH ONE TIME DAILY 90 tablet 0   nebivolol (BYSTOLIC) 10 MG tablet Take 2 tablets (20 mg total) by mouth daily. 90 tablet 1   No facility-administered medications prior to visit.    No Known Allergies  ROS    See HPI Objective:    Physical Exam Constitutional:      General: She is not in acute distress.    Appearance: Normal appearance. She is not ill-appearing.  HENT:     Head: Normocephalic and atraumatic.     Right Ear: External ear normal.     Left Ear: External ear normal.  Eyes:     Extraocular Movements: Extraocular movements intact.     Pupils: Pupils are equal, round, and reactive to light.  Cardiovascular:     Rate and Rhythm: Normal rate and regular rhythm.     Heart sounds: Normal heart sounds. No murmur heard.    No gallop.  Pulmonary:     Effort: Pulmonary effort is normal. No respiratory distress.     Breath sounds: Normal breath sounds. No wheezing or rales.  Skin:    General: Skin is warm and dry.  Neurological:     Mental Status: She is alert and oriented to person, place, and time.  Psychiatric:        Judgment: Judgment normal.     BP (!) 166/80 (BP Location: Left Arm, Patient Position: Sitting, Cuff Size: Large)   Pulse 65   Temp 97.9 F (36.6 C) (Oral)   Resp 16   Wt 170 lb (77.1 kg)   LMP 11/10/2005   SpO2 100%   BMI 30.11 kg/m  Wt Readings from Last 3 Encounters:  03/11/22 170 lb (77.1 kg)  03/02/22 166 lb (75.3 kg)  02/25/22 166 lb (75.3 kg)       Assessment & Plan:  Essential hypertension Assessment & Plan: Remains uncontrolled despite addition of hydralazine.  Advised pt to make the following medication changes:  Stop bystolic, start  carvedilol 12.'5mg'$  bid. Increase hydralazine to 3 times daily.  Continue valsartan Continue hctz   Other orders -     Carvedilol; Take 1 tablet (12.5 mg total) by mouth 2 (two) times daily with a meal.  Dispense: 60 tablet; Refill: 3 -     hydroCHLOROthiazide; Take 1 tablet (25 mg total) by mouth daily.  Dispense: 90 tablet; Refill: 1 -     hydrALAZINE HCl; Take 1 tablet (25 mg total) by mouth 3 (three) times daily.  Dispense: 270 tablet; Refill: 1    I, Nance Pear, NP, personally preformed the services described in this documentation.  All medical record entries made by the scribe were at my direction and in my presence.  I have reviewed the chart and discharge instructions (if applicable) and agree that the record reflects my personal performance and is accurate and complete. 03/11/2022   I,Shehryar Baig,acting as a scribe for Nance Pear, NP.,have documented all relevant documentation on the behalf of Nance Pear, NP,as directed by  Nance Pear, NP while in the presence of Nance Pear, NP.   Nance Pear, NP

## 2022-03-11 NOTE — Patient Instructions (Signed)
Stop bystolic, start carvedilol 12.'5mg'$  bid. Increase hydralazine to 3 times daily.

## 2022-03-11 NOTE — Assessment & Plan Note (Signed)
Remains uncontrolled despite addition of hydralazine.  Advised pt to make the following medication changes:  Stop bystolic, start carvedilol 12.'5mg'$  bid. Increase hydralazine to 3 times daily.  Continue valsartan Continue hctz

## 2022-03-16 ENCOUNTER — Encounter: Payer: Self-pay | Admitting: Family

## 2022-03-19 ENCOUNTER — Encounter: Payer: Self-pay | Admitting: Family

## 2022-03-25 ENCOUNTER — Ambulatory Visit: Payer: 59 | Admitting: Family

## 2022-03-25 VITALS — Temp 97.8°F | Resp 16 | Wt 167.0 lb

## 2022-03-25 DIAGNOSIS — I10 Essential (primary) hypertension: Secondary | ICD-10-CM

## 2022-03-25 NOTE — Assessment & Plan Note (Signed)
BP Readings from Last 3 Encounters:  03/11/22 (!) 166/80  03/02/22 138/72  02/25/22 (!) 144/75   BP today 119/65.  Much better.  Will continue on carvedilol and TID hydralazine.  She will continue to monitor at home and let me know if her bp readings are >140/90.

## 2022-03-25 NOTE — Progress Notes (Signed)
Subjective:   By signing my name below, I, Madelin Rear, attest that this documentation has been prepared under the direction and in the presence of Debbrah Alar, NP. 03/25/2022.   Patient ID: [name], female    DOB: 1965/11/24, 57 y.o.   MRN: [id number]  Chief Complaint  Patient presents with   Hypertension    Here for follow up after medication change    HPI Patient is in today for an office visit.  Blood pressure:  At her last visit, we stopped bystolic and started [id number] mg carvedilol BID. Hydralazine was also increased to TID. Her blood pressure has improved in clinic today. She is tolerating the medication changes.   BP Readings from Last 3 Encounters:  03/11/22 (!) 166/80  03/02/22 138/72  02/25/22 (!) 144/75    Past Medical History:  Diagnosis Date   Allergy    Anemia    Arthritis    left knee   Headache(784.0)    Chronic migraines--Dr Lewitt   Hypertension    Meckel's diverticulum 08/2004   with ischemia   Myoma 11/2005   right ovary   Sacroiliac dysfunction 06/23/2012   Uterine adhesions     Past Surgical History:  Procedure Laterality Date   ABDOMINAL HYSTERECTOMY  11/2005   bowel reconstruction     LAPAROSCOPIC SALPINGOOPHERECTOMY  11/2005   right-- assisted with daVinci robot, and lysis of omental adhesions.   LAPAROSCOPY  11/2005   total laparoscopy   OTHER SURGICAL HISTORY     Meckel's diverticulectomy, small bowel resection and resection of retrocecal appendix.    Family History  Problem Relation Age of Onset   Hypertension Other    Hypertension Mother    Hypertension Father    Cancer Neg Hx    Colon cancer Neg Hx    Esophageal cancer Neg Hx    Rectal cancer Neg Hx    Stomach cancer Neg Hx    Pancreatic cancer Neg Hx     Social History   Socioeconomic History   Marital status: Single    Spouse name: Not on file   Number of children: 3   Years of education: Not on file   Highest education level: 12th grade  Occupational  History   Occupation: Surveyor, quantity: [employer]  Tobacco Use   Smoking status: Never   Smokeless tobacco: Never  Substance and Sexual Activity   Alcohol use: Yes    Comment: social   Drug use: No   Sexual activity: Yes    Partners: Male  Other Topics Concern   Not on file  Social History Narrative   Lives with daughter (grand-daughter)   Daughter- lives in [place]- lives locally   Works at [employer]   Completed HS   No pets   Enjoys walking, mo   vies, Psychologist, clinical, bowling      Patient is right-handed. Her daughter and granddaughter live with her in a 2 story house. She drinks one cup of coffee a day in the winter months. She drinks tea occasionally. She walks daily, weather permitting.   Social Determinants of Health   Financial Resource Strain: Not on file  Food Insecurity: Not on file  Transportation Needs: Not on file  Physical Activity: Not on file  Stress: Not on file  Social Connections: Not on file  Intimate Partner Violence: Not on file    Outpatient Medications Prior to Visit  Medication Sig Dispense Refill   carvedilol (COREG) 12.5 MG  tablet Take 1 tablet (12.5 mg total) by mouth 2 (two) times daily with a meal. 60 tablet 3   Erenumab-aooe (AIMOVIG) 70 MG/ML SOAJ Inject 70 mg into the skin every 28 (twenty-eight) days. 1 mL 11   hydrALAZINE (APRESOLINE) 25 MG tablet Take 1 tablet (25 mg total) by mouth 3 (three) times daily. 270 tablet 1   hydrochlorothiazide (HYDRODIURIL) 25 MG tablet Take 1 tablet (25 mg total) by mouth daily. 90 tablet 1   loratadine (CLARITIN) 10 MG tablet Take 1 tablet (10 mg total) by mouth daily. 30 tablet 11   methocarbamol (ROBAXIN) 500 MG tablet Take 1 tablet (500 mg total) by mouth every 8 (eight) hours as needed for muscle spasms. 30 tablet 0   potassium chloride (KLOR-CON) 10 MEQ tablet Take 1 tablet (10 mEq total) by mouth daily. 30 tablet 5   Rimegepant Sulfate (NURTEC) 75 MG TBDP Take 1 tablet (75 mg total) by mouth daily as  needed. 8 tablet 11   valsartan (DIOVAN) 160 MG tablet 1 tablet by mouth once daily for 3 days, then increase to 2 tabs once daily on week 4. 180 tablet 0   No facility-administered medications prior to visit.    No Known Allergies  ROS See HPI.     Objective:    Physical Exam Constitutional:      Appearance: Normal appearance.  HENT:     Head: Normocephalic and atraumatic.     Right Ear: Tympanic membrane, ear canal and external ear normal.     Left Ear: Tympanic membrane, ear canal and external ear normal.  Eyes:     Extraocular Movements: Extraocular movements intact.     Pupils: Pupils are equal, round, and reactive to light.  Cardiovascular:     Rate and Rhythm: Normal rate and regular rhythm.     Heart sounds: Normal heart sounds. No murmur heard.    No gallop.  Pulmonary:     Effort: Pulmonary effort is normal. No respiratory distress.     Breath sounds: Normal breath sounds. No wheezing or rales.  Skin:    General: Skin is warm and dry.  Neurological:     General: No focal deficit present.     Mental Status: She is alert and oriented to person, place, and time.  Psychiatric:        Mood and Affect: Mood normal.        Behavior: Behavior normal.     Temp 97.8 F (36.6 C) (Oral)   Resp 16   Wt 167 lb (75.8 kg)   LMP 11/10/2005   SpO2 100%   BMI 29.58 kg/m  Wt Readings from Last 3 Encounters:  03/25/22 167 lb (75.8 kg)  03/11/22 170 lb (77.1 kg)  03/02/22 166 lb (75.3 kg)      Assessment & Plan:   Problem List Items Addressed This Visit       Unprioritized   Essential hypertension - Primary    BP Readings from Last 3 Encounters:  03/11/22 (!) 166/80  03/02/22 138/72  02/25/22 (!) 144/75  BP today 119/65.  Much better.  Will continue on carvedilol and TID hydralazine.  She will continue to monitor at home and let me know if her bp readings are >140/90.         No orders of the defined types were placed in this encounter.   I, Nance Pear, NP, personally preformed the services described in this documentation.  All medical record entries made by  the scribe were at my direction and in my presence.  I have reviewed the chart and discharge instructions (if applicable) and agree that the record reflects my personal performance and is accurate and complete. 03/25/2022.  I,Mathew Stumpf,acting as a Education administrator for [employer], NP.,have documented all relevant documentation on the behalf of Nance Pear, NP,as directed by  Nance Pear, NP while in the presence of Nance Pear, NP.   Nance Pear, NP

## 2022-05-11 ENCOUNTER — Other Ambulatory Visit: Payer: Self-pay | Admitting: Family

## 2022-06-16 ENCOUNTER — Telehealth: Payer: Self-pay

## 2022-06-16 NOTE — Telephone Encounter (Signed)
Lvm to r/s

## 2022-06-16 NOTE — Telephone Encounter (Signed)
Caller Name Bissie Dingee Caller Phone Number [phone] Patient Name [name] Patient DOB [birth year] Call Type Message Only Information Provided Reason for Call Request to Reschedule Office Appointment Initial Comment Caller states has appt tomorrow at 7am; needs to reschedule; needs a Wed Morning or any day after 1pm; Caller declined triage; Disp. Time Disposition Final User 06/16/2022 6:33:34 AM General Information Provided Yes Albin Fischer Call Closed By: Albin Fischer Transaction Date/Time: 06/16/2022 6:30:01 AM (ET)

## 2022-06-17 ENCOUNTER — Ambulatory Visit: Payer: 59 | Admitting: Family

## 2022-06-17 ENCOUNTER — Encounter: Payer: 59 | Admitting: Family

## 2022-07-11 ENCOUNTER — Other Ambulatory Visit: Payer: Self-pay | Admitting: Family

## 2022-07-15 ENCOUNTER — Encounter: Payer: 59 | Admitting: Family

## 2022-07-20 ENCOUNTER — Ambulatory Visit (INDEPENDENT_AMBULATORY_CARE_PROVIDER_SITE_OTHER): Payer: 59 | Admitting: Family

## 2022-07-20 ENCOUNTER — Encounter: Payer: Self-pay | Admitting: Family

## 2022-07-20 VITALS — BP 95/55 | HR 79 | Temp 98.1°F | Resp 18 | Ht 63.0 in | Wt 162.0 lb

## 2022-07-20 DIAGNOSIS — Z0000 Encounter for general adult medical examination without abnormal findings: Secondary | ICD-10-CM | POA: Diagnosis not present

## 2022-07-20 DIAGNOSIS — G43009 Migraine without aura, not intractable, without status migrainosus: Secondary | ICD-10-CM

## 2022-07-20 DIAGNOSIS — R634 Abnormal weight loss: Secondary | ICD-10-CM | POA: Diagnosis not present

## 2022-07-20 DIAGNOSIS — E785 Hyperlipidemia, unspecified: Secondary | ICD-10-CM

## 2022-07-20 DIAGNOSIS — R232 Flushing: Secondary | ICD-10-CM

## 2022-07-20 DIAGNOSIS — I10 Essential (primary) hypertension: Secondary | ICD-10-CM | POA: Diagnosis not present

## 2022-07-20 DIAGNOSIS — Z1231 Encounter for screening mammogram for malignant neoplasm of breast: Secondary | ICD-10-CM

## 2022-07-20 LAB — COMPREHENSIVE METABOLIC PANEL
ALT: 11 U/L (ref 0–35)
AST: 16 U/L (ref 0–37)
Albumin: 4.2 g/dL (ref 3.5–5.2)
Alkaline Phosphatase: 61 U/L (ref 39–117)
BUN: 37 mg/dL — ABNORMAL HIGH (ref 6–23)
CO2: 28 mEq/L (ref 19–32)
Calcium: 9.6 mg/dL (ref 8.4–10.5)
Chloride: 103 mEq/L (ref 96–112)
Creatinine, Ser: 1.27 mg/dL — ABNORMAL HIGH (ref 0.40–1.20)
GFR: 46.89 mL/min — ABNORMAL LOW (ref >60.00)
Glucose, Bld: 75 mg/dL (ref 70–99)
Potassium: 3.9 mEq/L (ref 3.5–5.1)
Sodium: 139 mEq/L (ref 135–145)
Total Bilirubin: 0.8 mg/dL (ref 0.2–1.2)
Total Protein: 6.5 g/dL (ref 6.0–8.3)

## 2022-07-20 LAB — LIPID PANEL
Cholesterol: 212 mg/dL — ABNORMAL HIGH (ref 0–200)
HDL: 36 mg/dL — ABNORMAL LOW (ref >39.00)
LDL Cholesterol: 153 mg/dL — ABNORMAL HIGH (ref 0–99)
NonHDL: 176.2
Total CHOL/HDL Ratio: 6
Triglycerides: 117 mg/dL (ref 0.0–149.0)
VLDL: 23.4 mg/dL (ref 0.0–40.0)

## 2022-07-20 LAB — TSH: TSH: 1.18 u[IU]/mL (ref 0.35–5.50)

## 2022-07-20 MED ORDER — CITALOPRAM HYDROBROMIDE 20 MG PO TABS: 20.0000 mg | ORAL_TABLET | Freq: Every day | ORAL | 0 refills | Status: DC

## 2022-07-20 NOTE — Assessment & Plan Note (Signed)
Continue healthy diet, exercise and weight loss efforts.  Schedule mammo, colo up to date  Recommend flu and covid booster in the fall.

## 2022-07-20 NOTE — Patient Instructions (Signed)
VISIT SUMMARY:  Dear [name], thank you for coming in for your routine check-up. We discussed several issues during your visit, including your low blood pressure, migraines, menopausal symptoms, and a sensation of pressure on your chest. We also discussed your need for a mammogram and some vaccinations, as well as a concern about possible hyperthyroidism.  YOUR PLAN:  -LOW BLOOD PRESSURE: Your blood pressure was lower than usual today. We will stop one of your blood pressure medications, Hydrochlorothiazide, and check your blood pressure again in a month.  -MIGRAINES: Your migraines are well-controlled with Aimovig. Please continue taking this medication as prescribed.  -BREAST CANCER SCREENING: You are due for a mammogram, a type of X-ray used for early detection of breast cancer. We have ordered this test, and you should schedule it after the 19th.  -IMMUNIZATIONS: You are up to date on your tetanus and shingles vaccines. However, we recommend that you get a COVID booster and flu shot in the fall at your local pharmacy.  -MENOPAUSAL SYMPTOMS: You reported experiencing hot flashes. We will start you on a medication called Citalopram to help manage these symptoms. Start with a half tablet for a week, then move to a full tablet. We will check in on this in a month.  -POSSIBLE HYPERTHYROIDISM: Your reports of feeling hot and recent weight loss may indicate an overactive thyroid, a condition known as hyperthyroidism. We have ordered some tests to check your thyroid function and cholesterol levels.  -GENERAL HEALTH MAINTENANCE: You are doing a great job maintaining a healthy lifestyle with a balanced diet and regular physical activity. Please continue these habits.  INSTRUCTIONS:  Please remember to schedule your mammogram after the 19th, get your COVID booster and flu shot in the fall, and start taking Citalopram for your hot flashes. Use your eye drops as needed for eye discomfort. We will recheck  your blood pressure and discuss your menopausal symptoms in a month. Please also make sure to get your thyroid function and cholesterol tests done.

## 2022-07-20 NOTE — Assessment & Plan Note (Signed)
Uncontrolled.  Trial of citalopram.  Begin 1/2 tab once daily for 1 week, then increase to a full tab on week two.

## 2022-07-20 NOTE — Assessment & Plan Note (Signed)
Stable on Aimovig, managed by neurology.

## 2022-07-20 NOTE — Progress Notes (Signed)
Subjective:     Patient ID: [name], female    DOB: Apr 02, 1965, 57 y.o.   MRN: [id number]  Chief Complaint  Patient presents with   Annual Exam    HPI  Discussed the use of AI scribe software for clinical note transcription with the patient, who gave verbal consent to proceed.  History of Present Illness   [name], a patient with a history of hypertension and migraines, presents for a routine check-up. She reports experiencing hot flashes, which have been particularly bothersome at night and during warmer months.[name] has been postmenopausal since 2007 following a hysterectomy, which included removal of her ovaries. She has not been on hormone replacement therapy.  [name]'s hypertension is currently managed with valsartan, hydrochlorothiazide, hydralazine, and carvedilol. However, her blood pressure was noted to be low during the visit, and she denies any symptoms of dizziness. Her migraines have been well-controlled with Aimovig injections prescribed by Dr. Everlena Cooper.  In terms of lifestyle, [name] reports a healthy diet primarily consisting of salads, chicken, and salmon. She has lost eight pounds since the spring, which she attributes to physical activity at work and walking with her grandchildren. She denies any tobacco or drug use and reports occasional alcohol consumption.     BP Readings from Last 3 Encounters:  07/20/22 (!) 95/55  03/11/22 (!) 166/80  03/02/22 138/72   Patient presents today for complete physical.  Immunizations:up to date Diet: healthy Wt Readings from Last 3 Encounters:  07/20/22 162 lb (73.5 kg)  03/25/22 167 lb (75.8 kg)  03/11/22 170 lb (77.1 kg)  Exercise:  enjoys walking Colonoscopy:2017 Pap Smear: hysterectomy Mammogram: due      There are no preventive care reminders to display for this patient.  Past Medical History:  Diagnosis Date   Allergy    Anemia    Arthritis    left knee   Headache(784.0)    Chronic migraines--Dr Lewitt    Hypertension    Meckel's diverticulum 08/2004   with ischemia   Myoma 11/2005   right ovary   Sacroiliac dysfunction 06/23/2012   Uterine adhesions     Past Surgical History:  Procedure Laterality Date   ABDOMINAL HYSTERECTOMY  11/2005   bowel reconstruction     LAPAROSCOPIC SALPINGOOPHERECTOMY  11/2005   right-- assisted with daVinci robot, and lysis of omental adhesions.   LAPAROSCOPY  11/2005   total laparoscopy   OTHER SURGICAL HISTORY     Meckel's diverticulectomy, small bowel resection and resection of retrocecal appendix.    Family History  Problem Relation Age of Onset   Hypertension Mother    Hypertension Father    Ovarian cancer Maternal Grandmother    Diabetes Mellitus I Maternal Grandmother    Hypertension Maternal Grandmother    Hypertension Other    Cancer Neg Hx    Colon cancer Neg Hx    Esophageal cancer Neg Hx    Rectal cancer Neg Hx    Stomach cancer Neg Hx    Pancreatic cancer Neg Hx     Social History   Socioeconomic History   Marital status: Single    Spouse name: Not on file   Number of children: 3   Years of education: Not on file   Highest education level: 12th grade  Occupational History   Occupation: Lobbyist: [employer]  Tobacco Use   Smoking status: Never   Smokeless tobacco: Never  Substance and Sexual Activity   Alcohol use: Yes    Comment:  social   Drug use: No   Sexual activity: Yes    Partners: Male  Other Topics Concern   Not on file  Social History Narrative   Lives with daughter (grand-daughter)   Daughter- lives in [place]- lives locally   Works at [employer]   Completed HS   No pets   Enjoys walking, mo   vies, Theme park manager, bowling      Patient is right-handed. Her daughter and granddaughter live with her in a 2 story house. She drinks one cup of coffee a day in the winter months. She drinks tea occasionally. She walks daily, weather permitting.   Social Determinants of Health   Financial Resource  Strain: Not on file  Food Insecurity: Not on file  Transportation Needs: Not on file  Physical Activity: Not on file  Stress: Not on file  Social Connections: Not on file  Intimate Partner Violence: Not on file    Outpatient Medications Prior to Visit  Medication Sig Dispense Refill   carvedilol (COREG) 12.5 MG tablet Take 1 tablet (12.5 mg total) by mouth 2 (two) times daily with a meal. 180 tablet 0   Erenumab-aooe (AIMOVIG) 70 MG/ML SOAJ Inject 70 mg into the skin every 28 (twenty-eight) days. 1 mL 11   hydrALAZINE (APRESOLINE) 25 MG tablet Take 1 tablet (25 mg total) by mouth 3 (three) times daily. 270 tablet 1   loratadine (CLARITIN) 10 MG tablet Take 1 tablet (10 mg total) by mouth daily. 30 tablet 11   methocarbamol (ROBAXIN) 500 MG tablet Take 1 tablet (500 mg total) by mouth every 8 (eight) hours as needed for muscle spasms. 30 tablet 0   Rimegepant Sulfate (NURTEC) 75 MG TBDP Take 1 tablet (75 mg total) by mouth daily as needed. 8 tablet 11   valsartan (DIOVAN) 160 MG tablet Take 1 tablet by mouth once daily for 3 days, then increase to 2 tablets once daily on week 4 180 tablet 0   hydrochlorothiazide (HYDRODIURIL) 25 MG tablet Take 1 tablet (25 mg total) by mouth daily. 90 tablet 1   potassium chloride (KLOR-CON) 10 MEQ tablet Take 1 tablet (10 mEq total) by mouth daily. 30 tablet 5   No facility-administered medications prior to visit.    No Known Allergies  Review of Systems  Constitutional:  Positive for weight loss.  HENT:  Negative for congestion and hearing loss.   Eyes:  Negative for blurred vision.  Respiratory:  Negative for cough.   Cardiovascular:  Negative for leg swelling.  Gastrointestinal:  Negative for constipation and diarrhea.  Genitourinary:  Negative for dysuria and frequency.  Musculoskeletal:  Negative for joint pain and myalgias.  Skin:  Negative for rash.  Neurological:  Negative for headaches.  Psychiatric/Behavioral:         Denies  depression/anxiety       Objective:    Physical Exam   BP (!) 95/55   Pulse 79   Temp 98.1 F (36.7 C)   Resp 18   Ht 5\' 3"  (1.6 m)   Wt 162 lb (73.5 kg)   LMP 11/10/2005   SpO2 100%   BMI 28.70 kg/m  Wt Readings from Last 3 Encounters:  07/20/22 162 lb (73.5 kg)  03/25/22 167 lb (75.8 kg)  03/11/22 170 lb (77.1 kg)  Physical Exam  Constitutional: She is oriented to person, place, and time. She appears well-developed and well-nourished. No distress.  HENT:  Head: Normocephalic and atraumatic.  Right Ear: Tympanic membrane  and ear canal normal.  Left Ear: Tympanic membrane and ear canal normal.  Mouth/Throat: Oropharynx is clear and moist.  Eyes: Pupils are equal, round, and reactive to light. No scleral icterus.  Neck: Normal range of motion. No thyromegaly present.  Cardiovascular: Normal rate and regular rhythm.   No murmur heard. Pulmonary/Chest: Effort normal and breath sounds normal. No respiratory distress. He has no wheezes. She has no rales. She exhibits no tenderness.  Abdominal: Soft. Bowel sounds are normal. She exhibits no distension and no mass. There is no tenderness. There is no rebound and no guarding.  Musculoskeletal: She exhibits no edema.  Lymphadenopathy:    She has no cervical adenopathy.  Neurological: She is alert and oriented to person, place, and time. She has normal patellar reflexes. She exhibits normal muscle tone. Coordination normal.  Skin: Skin is warm and dry.  Psychiatric: She has a normal mood and affect. Her behavior is normal. Judgment and thought content normal.  Breast/pelvic: deferred          Assessment & Plan:        Assessment & Plan:   Problem List Items Addressed This Visit       Unprioritized   Weight loss   Relevant Orders   TSH   Preventative health care - Primary    Continue healthy diet, exercise and weight loss efforts.  Schedule mammo, colo up to date  Recommend flu and covid booster in the fall.        Migraine    Stable on Aimovig, managed by neurology.       Relevant Medications   citalopram (CELEXA) 20 MG tablet   Hyperlipidemia   Relevant Orders   Lipid panel   Hot flashes    Uncontrolled.  Trial of citalopram.  Begin 1/2 tab once daily for 1 week, then increase to a full tab on week two.       Relevant Medications   citalopram (CELEXA) 20 MG tablet   Essential hypertension    Overtreated.  Not taking Kdur.  D/c hydrochlorothiazide.       Relevant Orders   Comp Met (CMET)   Other Visit Diagnoses     Breast cancer screening by mammogram       Relevant Orders   MM 3D SCREENING MAMMOGRAM BILATERAL BREAST       I have discontinued Sherrel E. Hopple's potassium chloride and hydrochlorothiazide. I am also having her start on citalopram. Additionally, I am having her maintain her loratadine, methocarbamol, Aimovig, Nurtec, hydrALAZINE, valsartan, and carvedilol.  Meds ordered this encounter  Medications   citalopram (CELEXA) 20 MG tablet    Sig: Take 1 tablet (20 mg total) by mouth daily.    Dispense:  90 tablet    Refill:  0    Order Specific Question:   Supervising Provider    Answer:   Danise Edge A [[id number]]

## 2022-07-20 NOTE — Assessment & Plan Note (Signed)
Overtreated.  Not taking Kdur.  D/c hydrochlorothiazide.

## 2022-07-22 ENCOUNTER — Telehealth: Payer: Self-pay | Admitting: Family

## 2022-07-22 NOTE — Telephone Encounter (Signed)
See mychart.  

## 2022-08-04 ENCOUNTER — Ambulatory Visit (HOSPITAL_BASED_OUTPATIENT_CLINIC_OR_DEPARTMENT_OTHER)
Admission: RE | Admit: 2022-08-04 | Discharge: 2022-08-04 | Disposition: A | Discharge status: Home / Self Care | Payer: 59 | Source: Ambulatory Visit | Attending: Family | Admitting: Family

## 2022-08-04 ENCOUNTER — Encounter (HOSPITAL_BASED_OUTPATIENT_CLINIC_OR_DEPARTMENT_OTHER): Payer: Self-pay

## 2022-08-04 DIAGNOSIS — Z1231 Encounter for screening mammogram for malignant neoplasm of breast: Secondary | ICD-10-CM | POA: Insufficient documentation

## 2022-08-06 ENCOUNTER — Other Ambulatory Visit: Payer: Self-pay | Admitting: Family

## 2022-08-17 ENCOUNTER — Encounter: Payer: Self-pay | Admitting: Family

## 2022-08-17 ENCOUNTER — Ambulatory Visit: Payer: 59 | Admitting: Family

## 2022-08-17 VITALS — BP 125/69 | HR 63 | Temp 98.3°F | Resp 16 | Wt 157.0 lb

## 2022-08-17 DIAGNOSIS — M5441 Lumbago with sciatica, right side: Secondary | ICD-10-CM | POA: Diagnosis not present

## 2022-08-17 DIAGNOSIS — N289 Disorder of kidney and ureter, unspecified: Secondary | ICD-10-CM

## 2022-08-17 DIAGNOSIS — I10 Essential (primary) hypertension: Secondary | ICD-10-CM

## 2022-08-17 HISTORY — DX: Disorder of kidney and ureter, unspecified: N28.9

## 2022-08-17 LAB — BASIC METABOLIC PANEL
BUN: 17 mg/dL (ref 6–23)
CO2: 25 mEq/L (ref 19–32)
Calcium: 9.5 mg/dL (ref 8.4–10.5)
Chloride: 107 mEq/L (ref 96–112)
Creatinine, Ser: 0.84 mg/dL (ref 0.40–1.20)
GFR: 76.96 mL/min (ref >60.00)
Glucose, Bld: 87 mg/dL (ref 70–99)
Potassium: 3.7 mEq/L (ref 3.5–5.1)
Sodium: 139 mEq/L (ref 135–145)

## 2022-08-17 MED ORDER — HYDRALAZINE HCL 25 MG PO TABS: 25.0000 mg | ORAL_TABLET | Freq: Three times a day (TID) | ORAL | 1 refills | Status: DC

## 2022-08-17 MED ORDER — VALSARTAN 160 MG PO TABS: ORAL_TABLET | ORAL | 0 refills | Status: DC

## 2022-08-17 MED ORDER — CARVEDILOL 12.5 MG PO TABS: 12.5000 mg | ORAL_TABLET | Freq: Two times a day (BID) | ORAL | 1 refills | Status: DC

## 2022-08-17 NOTE — Patient Instructions (Signed)
VISIT SUMMARY:  During your visit, we discussed your hypertension (high blood pressure) and sciatica (pain along the path of the sciatic nerve). We noted that your blood pressure medication was recently changed due to concerns about your kidney function. We also discussed your sciatica symptoms, which seem to be worse in the morning and after periods of rest, but improve with movement.  YOUR PLAN:  -MILD KIDNEY IMPAIRMENT: We have stopped your previous blood pressure medication due to concerns about your kidney function. We will check your kidney function and electrolytes today. You should continue taking Valsartan, and a prescription refill will be placed on file. Please avoid medications that can harm the kidneys, such as non-steroidal anti-inflammatory drugs (NSAIDs).  -SCIATICA: We discussed your sciatica symptoms. I will provide you with some exercises to do at home that may help. If these exercises do not improve your symptoms, we may consider referring you to formal physical therapy.  INSTRUCTIONS:  Please continue to monitor your blood pressure at home and report any significant changes. Perform the provided exercises for your sciatica and note any changes in your symptoms. If your symptoms do not improve, or if they worsen, please contact our office.

## 2022-08-17 NOTE — Assessment & Plan Note (Signed)
New. Noted on last bmet. Repeat renal function today. Advised pt to stay well hydrated, maintain good bp control and avoid NSAIDS.

## 2022-08-17 NOTE — Assessment & Plan Note (Signed)
Recommended some home back exercises to try.

## 2022-08-17 NOTE — Assessment & Plan Note (Signed)
BP stable/improved off of hydrochlorothiazide.  Continue hydralazine, valsartan and carvedilol.

## 2022-08-17 NOTE — Progress Notes (Signed)
Subjective:     Patient ID: [name], female    DOB: 12/17/1965, 57 y.o.   MRN: [id number]  Chief Complaint  Patient presents with   Hypertension    Here for follow up    Hypertension    Discussed the use of AI scribe software for clinical note transcription with the patient, who gave verbal consent to proceed.  History of Present Illness   The patient, with a history of hypertension, was recently taken off hydrochlorothiazide due to low blood pressure.  In addition to hypertension, the patient has been experiencing some right sided sciatica, which has been particularly bothersome recently due to increased activity. The symptoms are most severe in the morning, causing instability upon waking, but improve with movement. However, the discomfort returns after a period of rest post-work. The patient has been attempting to manage the pain through stretching exercises at home.          Health Maintenance Due  Topic Date Due   INFLUENZA VACCINE  08/11/2022    Past Medical History:  Diagnosis Date   Allergy    Anemia    Arthritis    left knee   Headache(784.0)    Chronic migraines--Dr Lewitt   Hypertension    Meckel's diverticulum 08/2004   with ischemia   Myoma 11/2005   right ovary   Renal insufficiency 08/17/2022   Sacroiliac dysfunction 06/23/2012   Uterine adhesions     Past Surgical History:  Procedure Laterality Date   ABDOMINAL HYSTERECTOMY  11/2005   bowel reconstruction     LAPAROSCOPIC SALPINGOOPHERECTOMY  11/2005   right-- assisted with daVinci robot, and lysis of omental adhesions.   LAPAROSCOPY  11/2005   total laparoscopy   OTHER SURGICAL HISTORY     Meckel's diverticulectomy, small bowel resection and resection of retrocecal appendix.    Family History  Problem Relation Age of Onset   Hypertension Mother    Hypertension Father    Ovarian cancer Maternal Grandmother    Diabetes Mellitus I Maternal Grandmother    Hypertension Maternal  Grandmother    Hypertension Other    Cancer Neg Hx    Colon cancer Neg Hx    Esophageal cancer Neg Hx    Rectal cancer Neg Hx    Stomach cancer Neg Hx    Pancreatic cancer Neg Hx     Social History   Socioeconomic History   Marital status: Single    Spouse name: Not on file   Number of children: 3   Years of education: Not on file   Highest education level: 12th grade  Occupational History   Occupation: Lobbyist: [employer]  Tobacco Use   Smoking status: Never   Smokeless tobacco: Never  Substance and Sexual Activity   Alcohol use: Yes    Comment: social   Drug use: No   Sexual activity: Yes    Partners: Male  Other Topics Concern   Not on file  Social History Narrative   Lives with daughter (grand-daughter)   Daughter- lives in [place]- lives locally   Works at [employer]   Completed HS   No pets   Enjoys walking, mo   vies, Theme park manager, bowling      Patient is right-handed. Her daughter and granddaughter live with her in a 2 story house. She drinks one cup of coffee a day in the winter months. She drinks tea occasionally. She walks daily, weather permitting.   Social Determinants  of Health   Financial Resource Strain: Low Risk  (08/10/2022)   Overall Financial Resource Strain (CARDIA)    Difficulty of Paying Living Expenses: Not hard at all  Food Insecurity: No Food Insecurity (08/10/2022)   Hunger Vital Sign    Worried About Running Out of Food in the Last Year: Never true    Ran Out of Food in the Last Year: Never true  Transportation Needs: No Transportation Needs (08/10/2022)   PRAPARE - Administrator, Civil Service (Medical): No    Lack of Transportation (Non-Medical): No  Physical Activity: Insufficiently Active (08/10/2022)   Exercise Vital Sign    Days of Exercise per Week: 3 days    Minutes of Exercise per Session: 30 min  Stress: No Stress Concern Present (08/10/2022)   [employer] of Occupational Health - Occupational  Stress Questionnaire    Feeling of Stress : Not at all  Social Connections: Moderately Isolated (08/10/2022)   Social Connection and Isolation Panel [NHANES]    Frequency of Communication with Friends and Family: Three times a week    Frequency of Social Gatherings with Friends and Family: Once a week    Attends Religious Services: 1 to 4 times per year    Active Member of [employer] or Organizations: No    Attends Banker Meetings: Not on file    Marital Status: Widowed  Intimate Partner Violence: Not on file    Outpatient Medications Prior to Visit  Medication Sig Dispense Refill   citalopram (CELEXA) 20 MG tablet Take 1 tablet (20 mg total) by mouth daily. 90 tablet 0   Erenumab-aooe (AIMOVIG) 70 MG/ML SOAJ Inject 70 mg into the skin every 28 (twenty-eight) days. 1 mL 11   loratadine (CLARITIN) 10 MG tablet Take 1 tablet (10 mg total) by mouth daily. 30 tablet 11   Rimegepant Sulfate (NURTEC) 75 MG TBDP Take 1 tablet (75 mg total) by mouth daily as needed. 8 tablet 11   carvedilol (COREG) 12.5 MG tablet Take 1 tablet (12.5 mg total) by mouth 2 (two) times daily with a meal. 180 tablet 0   hydrALAZINE (APRESOLINE) 25 MG tablet Take 1 tablet (25 mg total) by mouth 3 (three) times daily. 270 tablet 1   methocarbamol (ROBAXIN) 500 MG tablet Take 1 tablet (500 mg total) by mouth every 8 (eight) hours as needed for muscle spasms. 30 tablet 0   valsartan (DIOVAN) 160 MG tablet TAKE ONE TABLET BY MOUTH ONCE DAILY FOR 3 DAYS, THEN INCREASE TO TWO TABLETS ONCE DAILY ON DAY 4 180 tablet 0   No facility-administered medications prior to visit.    No Known Allergies  ROS     Objective:    Physical Exam Constitutional:      General: She is not in acute distress.    Appearance: Normal appearance. She is well-developed.  HENT:     Head: Normocephalic and atraumatic.     Right Ear: External ear normal.     Left Ear: External ear normal.  Eyes:     General: No scleral  icterus. Neck:     Thyroid: No thyromegaly.  Cardiovascular:     Rate and Rhythm: Normal rate and regular rhythm.     Heart sounds: Normal heart sounds. No murmur heard. Pulmonary:     Effort: Pulmonary effort is normal. No respiratory distress.     Breath sounds: Normal breath sounds. No wheezing.  Musculoskeletal:     Cervical back: Neck supple.  Skin:  General: Skin is warm and dry.  Neurological:     Mental Status: She is alert and oriented to person, place, and time.  Psychiatric:        Mood and Affect: Mood normal.        Behavior: Behavior normal.        Thought Content: Thought content normal.        Judgment: Judgment normal.      BP 125/69 (BP Location: Right Arm, Patient Position: Sitting, Cuff Size: Small)   Pulse 63   Temp 98.3 F (36.8 C) (Oral)   Resp 16   Wt 157 lb (71.2 kg)   LMP 11/10/2005   SpO2 99%   BMI 27.81 kg/m  Wt Readings from Last 3 Encounters:  08/17/22 157 lb (71.2 kg)  07/20/22 162 lb (73.5 kg)  03/25/22 167 lb (75.8 kg)       Assessment & Plan:   Problem List Items Addressed This Visit       Unprioritized   Renal insufficiency - Primary    New. Noted on last bmet. Repeat renal function today. Advised pt to stay well hydrated, maintain good bp control and avoid NSAIDS.      Relevant Orders   Basic Metabolic Panel (BMET)   Essential hypertension    BP stable/improved off of hydrochlorothiazide.  Continue hydralazine, valsartan and carvedilol.        Relevant Medications   valsartan (DIOVAN) 160 MG tablet   carvedilol (COREG) 12.5 MG tablet   hydrALAZINE (APRESOLINE) 25 MG tablet   Acute right-sided low back pain with right-sided sciatica    Recommended some home back exercises to try.         I have discontinued Robbyn E. Olano's methocarbamol. I am also having her maintain her loratadine, Aimovig, Nurtec, citalopram, valsartan, carvedilol, and hydrALAZINE.  Meds ordered this encounter  Medications   valsartan  (DIOVAN) 160 MG tablet    Sig: TAKE ONE TABLET BY MOUTH ONCE DAILY FOR 3 DAYS, THEN INCREASE TO TWO TABLETS ONCE DAILY ON DAY 4    Dispense:  180 tablet    Refill:  0    Order Specific Question:   Supervising Provider    Answer:   Danise Edge A [[id number]]   carvedilol (COREG) 12.5 MG tablet    Sig: Take 1 tablet (12.5 mg total) by mouth 2 (two) times daily with a meal.    Dispense:  180 tablet    Refill:  1    Order Specific Question:   Supervising Provider    Answer:   Danise Edge A [[id number]]   hydrALAZINE (APRESOLINE) 25 MG tablet    Sig: Take 1 tablet (25 mg total) by mouth 3 (three) times daily.    Dispense:  270 tablet    Refill:  1    Order Specific Question:   Supervising Provider    Answer:   Danise Edge A [[id number]]

## 2022-10-14 ENCOUNTER — Other Ambulatory Visit: Payer: Self-pay | Admitting: Family

## 2022-10-14 DIAGNOSIS — R232 Flushing: Secondary | ICD-10-CM

## 2022-12-21 ENCOUNTER — Ambulatory Visit: Payer: 59 | Admitting: Family

## 2022-12-27 ENCOUNTER — Encounter: Payer: Self-pay | Admitting: Family

## 2022-12-27 ENCOUNTER — Ambulatory Visit: Payer: 59 | Admitting: Family

## 2022-12-27 VITALS — BP 142/85 | HR 57 | Temp 98.4°F | Resp 16 | Ht 63.0 in | Wt 158.0 lb

## 2022-12-27 DIAGNOSIS — N289 Disorder of kidney and ureter, unspecified: Secondary | ICD-10-CM

## 2022-12-27 DIAGNOSIS — I10 Essential (primary) hypertension: Secondary | ICD-10-CM

## 2022-12-27 DIAGNOSIS — R232 Flushing: Secondary | ICD-10-CM

## 2022-12-27 DIAGNOSIS — K219 Gastro-esophageal reflux disease without esophagitis: Secondary | ICD-10-CM

## 2022-12-27 DIAGNOSIS — G43009 Migraine without aura, not intractable, without status migrainosus: Secondary | ICD-10-CM | POA: Diagnosis not present

## 2022-12-27 DIAGNOSIS — E785 Hyperlipidemia, unspecified: Secondary | ICD-10-CM | POA: Diagnosis not present

## 2022-12-27 MED ORDER — CARVEDILOL 12.5 MG PO TABS: 12.5000 mg | ORAL_TABLET | Freq: Two times a day (BID) | ORAL | 1 refills | Status: DC

## 2022-12-27 MED ORDER — HYDRALAZINE HCL 25 MG PO TABS: 25.0000 mg | ORAL_TABLET | Freq: Three times a day (TID) | ORAL | 1 refills | Status: DC

## 2022-12-27 MED ORDER — CITALOPRAM HYDROBROMIDE 20 MG PO TABS: 20.0000 mg | ORAL_TABLET | Freq: Every day | ORAL | 0 refills | Status: DC

## 2022-12-27 MED ORDER — VALSARTAN 320 MG PO TABS: 320.0000 mg | ORAL_TABLET | Freq: Every day | ORAL | 1 refills | Status: DC

## 2022-12-27 NOTE — Assessment & Plan Note (Addendum)
Much improved. Continue citalopram.  

## 2022-12-27 NOTE — Assessment & Plan Note (Addendum)
BP Readings from Last 3 Encounters:  12/27/22 (!) 142/85  08/17/22 125/69  07/20/22 (!) 95/55   Slightly above goal. Will continue current doses of carvedilol, hydralazine and diovan. She will monitor her bp regularly at home and reach out to me if she is seeing consistent readings >140/90.

## 2022-12-27 NOTE — Assessment & Plan Note (Signed)
No recent issues with gerd, not on meds.  Monitor.

## 2022-12-27 NOTE — Progress Notes (Signed)
Subjective:     Patient ID: [name], female    DOB: Feb 07, 1965, 57 y.o.   MRN: [id number]  Chief Complaint  Patient presents with   Hypertension    Here for follow up    Hypertension    Discussed the use of AI scribe software for clinical note transcription with the patient, who gave verbal consent to proceed.  History of Present Illness   The patient, with a history of hypertension, migraines, gastroesophageal reflux disease (GERD), post-menopausal hot flashes, and sciatica, presents for a routine follow-up. She reports a blood pressure reading of 150/74, which is slightly elevated. She is currently on carvedilol 12.5mg , hydralazine 25mg , and valsartan 160mg  two tabs daily for hypertension management.  The patient denies any recent migraines and has Nurtec for as-needed use and Aimovig once a month. She is also under the care of a neurologist. She reports occasional hot flashes but not severe enough to cause concern. Her sciatica has been mostly quiet with a recent minor episode.  She also reports no recent issues with GERD. She has received the most recent COVID-19 vaccine and the flu shot. She works long hours, which she believes may contribute to her elevated blood pressure reading.       Health Maintenance Due  Topic Date Due   COVID-19 Vaccine (6 - 2024-25 season) 11/26/2022    Past Medical History:  Diagnosis Date   Acute right-sided low back pain with right-sided sciatica 12/11/2008   Qualifier: Diagnosis of   By: Nena Jordan        Allergy    Anemia    Arthritis    left knee   Headache(784.0)    Chronic migraines--Dr Lewitt   Hypertension    Meckel's diverticulum 08/2004   with ischemia   Myoma 11/2005   right ovary   Renal insufficiency 08/17/2022   Sacroiliac dysfunction 06/23/2012   Uterine adhesions     Past Surgical History:  Procedure Laterality Date   ABDOMINAL HYSTERECTOMY  11/2005   bowel reconstruction     LAPAROSCOPIC  SALPINGOOPHERECTOMY  11/2005   right-- assisted with daVinci robot, and lysis of omental adhesions.   LAPAROSCOPY  11/2005   total laparoscopy   OTHER SURGICAL HISTORY     Meckel's diverticulectomy, small bowel resection and resection of retrocecal appendix.    Family History  Problem Relation Age of Onset   Hypertension Mother    Hypertension Father    Ovarian cancer Maternal Grandmother    Diabetes Mellitus I Maternal Grandmother    Hypertension Maternal Grandmother    Hypertension Other    Cancer Neg Hx    Colon cancer Neg Hx    Esophageal cancer Neg Hx    Rectal cancer Neg Hx    Stomach cancer Neg Hx    Pancreatic cancer Neg Hx     Social History   Socioeconomic History   Marital status: Single    Spouse name: Not on file   Number of children: 3   Years of education: Not on file   Highest education level: 12th grade  Occupational History   Occupation: Lobbyist: [employer]  Tobacco Use   Smoking status: Never   Smokeless tobacco: Never  Substance and Sexual Activity   Alcohol use: Yes    Comment: social   Drug use: No   Sexual activity: Yes    Partners: Male  Other Topics Concern   Not on file  Social History Narrative  Lives with daughter Radio broadcast assistant)   Daughter- lives in [place]- lives locally   Works at [employer]   Completed HS   No pets   Enjoys walking, mo   vies, Theme park manager, bowling      Patient is right-handed. Her daughter and granddaughter live with her in a 2 story house. She drinks one cup of coffee a day in the winter months. She drinks tea occasionally. She walks daily, weather permitting.   Social Drivers of Corporate investment banker Strain: Low Risk  (08/10/2022)   Overall Financial Resource Strain (CARDIA)    Difficulty of Paying Living Expenses: Not hard at all  Food Insecurity: No Food Insecurity (08/10/2022)   Hunger Vital Sign    Worried About Running Out of Food in the Last Year: Never true    Ran Out of Food in  the Last Year: Never true  Transportation Needs: No Transportation Needs (08/10/2022)   PRAPARE - Administrator, Civil Service (Medical): No    Lack of Transportation (Non-Medical): No  Physical Activity: Insufficiently Active (08/10/2022)   Exercise Vital Sign    Days of Exercise per Week: 3 days    Minutes of Exercise per Session: 30 min  Stress: No Stress Concern Present (08/10/2022)   [employer] of Occupational Health - Occupational Stress Questionnaire    Feeling of Stress : Not at all  Social Connections: Moderately Isolated (08/10/2022)   Social Connection and Isolation Panel [NHANES]    Frequency of Communication with Friends and Family: Three times a week    Frequency of Social Gatherings with Friends and Family: Once a week    Attends Religious Services: 1 to 4 times per year    Active Member of [employer] or Organizations: No    Attends Banker Meetings: Not on file    Marital Status: Widowed  Intimate Partner Violence: Not on file    Outpatient Medications Prior to Visit  Medication Sig Dispense Refill   Erenumab-aooe (AIMOVIG) 70 MG/ML SOAJ Inject 70 mg into the skin every 28 (twenty-eight) days. 1 mL 11   loratadine (CLARITIN) 10 MG tablet Take 1 tablet (10 mg total) by mouth daily. 30 tablet 11   Rimegepant Sulfate (NURTEC) 75 MG TBDP Take 1 tablet (75 mg total) by mouth daily as needed. 8 tablet 11   carvedilol (COREG) 12.5 MG tablet Take 1 tablet (12.5 mg total) by mouth 2 (two) times daily with a meal. 180 tablet 1   citalopram (CELEXA) 20 MG tablet Take 1 tablet (20 mg total) by mouth daily. 90 tablet 0   hydrALAZINE (APRESOLINE) 25 MG tablet Take 1 tablet (25 mg total) by mouth 3 (three) times daily. 270 tablet 1   valsartan (DIOVAN) 160 MG tablet TAKE ONE TABLET BY MOUTH ONCE DAILY FOR 3 DAYS, THEN INCREASE TO TWO TABLETS ONCE DAILY ON DAY 4 180 tablet 0   No facility-administered medications prior to visit.    No Known  Allergies  ROS See HPI    Objective:    Physical Exam Constitutional:      General: She is not in acute distress.    Appearance: Normal appearance. She is well-developed.  HENT:     Head: Normocephalic and atraumatic.     Right Ear: External ear normal.     Left Ear: External ear normal.  Eyes:     General: No scleral icterus. Neck:     Thyroid: No thyromegaly.  Cardiovascular:  Rate and Rhythm: Normal rate and regular rhythm.     Heart sounds: Normal heart sounds. No murmur heard. Pulmonary:     Effort: Pulmonary effort is normal. No respiratory distress.     Breath sounds: Normal breath sounds. No wheezing.  Musculoskeletal:     Cervical back: Neck supple.  Skin:    General: Skin is warm and dry.  Neurological:     Mental Status: She is alert and oriented to person, place, and time.  Psychiatric:        Mood and Affect: Mood normal.        Behavior: Behavior normal.        Thought Content: Thought content normal.        Judgment: Judgment normal.      BP (!) 142/85   Pulse (!) 57   Temp 98.4 F (36.9 C) (Oral)   Resp 16   Ht 5\' 3"  (1.6 m)   Wt 158 lb (71.7 kg)   LMP 11/10/2005   SpO2 100%   BMI 27.99 kg/m  Wt Readings from Last 3 Encounters:  12/27/22 158 lb (71.7 kg)  08/17/22 157 lb (71.2 kg)  07/20/22 162 lb (73.5 kg)       Assessment & Plan:   Problem List Items Addressed This Visit       Unprioritized   Renal insufficiency - Primary   Lab Results  Component Value Date   CREATININE 0.84 08/17/2022   GFR was 76 last visit.  Monitor.       Relevant Orders   Basic Metabolic Panel (BMET)   Migraine   Followed by neurology. Stable on aimovig and prn nurtec.      Relevant Medications   valsartan (DIOVAN) 320 MG tablet   citalopram (CELEXA) 20 MG tablet   hydrALAZINE (APRESOLINE) 25 MG tablet   carvedilol (COREG) 12.5 MG tablet   Hyperlipidemia   Lab Results  Component Value Date   CHOL 212 (H) 07/20/2022   HDL 36.00 (L)  07/20/2022   LDLCALC 153 (H) 07/20/2022   TRIG 117.0 07/20/2022   CHOLHDL 6 07/20/2022   Mildly elevated. Not on statin, continue to work on low cholesterol diet and exercise.      Relevant Medications   valsartan (DIOVAN) 320 MG tablet   hydrALAZINE (APRESOLINE) 25 MG tablet   carvedilol (COREG) 12.5 MG tablet   Hot flashes   Much improved. Continue citalopram.       Relevant Medications   valsartan (DIOVAN) 320 MG tablet   citalopram (CELEXA) 20 MG tablet   hydrALAZINE (APRESOLINE) 25 MG tablet   carvedilol (COREG) 12.5 MG tablet   GERD (gastroesophageal reflux disease)   No recent issues with gerd, not on meds.  Monitor.       Essential hypertension   BP Readings from Last 3 Encounters:  12/27/22 (!) 142/85  08/17/22 125/69  07/20/22 (!) 95/55   Slightly above goal. Will continue current doses of carvedilol, hydralazine and diovan. She will monitor her bp regularly at home and reach out to me if she is seeing consistent readings >140/90.        Relevant Medications   valsartan (DIOVAN) 320 MG tablet   hydrALAZINE (APRESOLINE) 25 MG tablet   carvedilol (COREG) 12.5 MG tablet    I have discontinued Elie E. Shawhan's valsartan. I am also having her start on valsartan. Additionally, I am having her maintain her loratadine, Aimovig, Nurtec, citalopram, hydrALAZINE, and carvedilol.  Meds ordered this encounter  Medications  valsartan (DIOVAN) 320 MG tablet    Sig: Take 1 tablet (320 mg total) by mouth daily.    Dispense:  90 tablet    Refill:  1    Supervising Provider:   Danise Edge A [[id number]]   citalopram (CELEXA) 20 MG tablet    Sig: Take 1 tablet (20 mg total) by mouth daily.    Dispense:  90 tablet    Refill:  0    Supervising Provider:   Danise Edge A [[id number]]   hydrALAZINE (APRESOLINE) 25 MG tablet    Sig: Take 1 tablet (25 mg total) by mouth 3 (three) times daily.    Dispense:  270 tablet    Refill:  1    Supervising Provider:   Danise Edge A [[id number]]    carvedilol (COREG) 12.5 MG tablet    Sig: Take 1 tablet (12.5 mg total) by mouth 2 (two) times daily with a meal.    Dispense:  180 tablet    Refill:  1    Supervising Provider:   Danise Edge A [[id number]]

## 2022-12-27 NOTE — Assessment & Plan Note (Addendum)
Lab Results  Component Value Date   CREATININE 0.84 08/17/2022   GFR was 76 last visit.  Monitor.

## 2022-12-27 NOTE — Assessment & Plan Note (Signed)
Followed by neurology. Stable on aimovig and prn nurtec.

## 2022-12-27 NOTE — Assessment & Plan Note (Addendum)
Lab Results  Component Value Date   CHOL 212 (H) 07/20/2022   HDL 36.00 (L) 07/20/2022   LDLCALC 153 (H) 07/20/2022   TRIG 117.0 07/20/2022   CHOLHDL 6 07/20/2022   Mildly elevated. Not on statin, continue to work on low cholesterol diet and exercise.

## 2022-12-27 NOTE — Patient Instructions (Signed)
VISIT SUMMARY:  During today's visit, we reviewed your history of hypertension, migraines, GERD, menopausal symptoms, and sciatica. Your blood pressure was slightly elevated, and we discussed your current medications and lifestyle factors. We also reviewed your management plans for migraines, GERD, menopausal symptoms, and sciatica. Additionally, we confirmed that you have received your most recent COVID-19 vaccine and flu shot.  YOUR PLAN:  -HYPERTENSION: Hypertension means high blood pressure. Your blood pressure was slightly elevated today. Please monitor your blood pressure at home and report if it is consistently over 140/90. We will repeat your kidney function test today.  -MIGRAINE: Migraines are severe headaches often accompanied by other symptoms. You have not had any recent episodes and are currently on Aimovig monthly and Nurtec as needed. Continue with your current treatment and follow up with your neurologist as planned.  -HYPERLIPIDEMIA: Hyperlipidemia means high cholesterol levels. Your last cholesterol check was slightly elevated. Please continue with a healthy diet to manage your cholesterol levels.  -GASTROESOPHAGEAL REFLUX DISEASE (GERD): GERD is a condition where stomach acid frequently flows back into the tube connecting your mouth and stomach. You have not had any recent symptoms. Continue with your current management plan.  -MENOPAUSAL SYMPTOMS: Menopausal symptoms can include hot flashes and other changes due to reduced hormone levels. You have occasional hot flashes and are taking Citalopram as needed. Continue with this medication as needed.  -SCIATICA: Sciatica is pain that radiates along the path of the sciatic nerve. You had a recent minor episode but overall it is well-controlled. Continue with your current management plan.  -COVID-19 VACCINATION: You have received the most recent COVID-19 vaccine. Continue to follow current guidelines for COVID-19  vaccination.  INSTRUCTIONS:  Please follow up in 6 months for a blood pressure check. Additionally, schedule a physical exam at the appropriate interval.

## 2022-12-28 LAB — BASIC METABOLIC PANEL
BUN: 24 mg/dL — ABNORMAL HIGH (ref 6–23)
CO2: 29 meq/L (ref 19–32)
Calcium: 8.9 mg/dL (ref 8.4–10.5)
Chloride: 108 meq/L (ref 96–112)
Creatinine, Ser: 0.82 mg/dL (ref 0.40–1.20)
GFR: 79.01 mL/min (ref >60.00)
Glucose, Bld: 95 mg/dL (ref 70–99)
Potassium: 4 meq/L (ref 3.5–5.1)
Sodium: 143 meq/L (ref 135–145)

## 2023-02-08 ENCOUNTER — Encounter: Payer: Self-pay | Admitting: Medical

## 2023-02-08 ENCOUNTER — Ambulatory Visit: Payer: 59 | Admitting: Medical

## 2023-02-08 VITALS — BP 148/80 | HR 64 | Resp 18 | Ht 63.0 in | Wt 155.0 lb

## 2023-02-08 DIAGNOSIS — R1031 Right lower quadrant pain: Secondary | ICD-10-CM | POA: Diagnosis not present

## 2023-02-08 DIAGNOSIS — M5441 Lumbago with sciatica, right side: Secondary | ICD-10-CM | POA: Diagnosis not present

## 2023-02-08 LAB — COMPREHENSIVE METABOLIC PANEL
ALT: 15 U/L (ref 0–35)
AST: 18 U/L (ref 0–37)
Albumin: 4.5 g/dL (ref 3.5–5.2)
Alkaline Phosphatase: 81 U/L (ref 39–117)
BUN: 21 mg/dL (ref 6–23)
CO2: 31 meq/L (ref 19–32)
Calcium: 9.6 mg/dL (ref 8.4–10.5)
Chloride: 104 meq/L (ref 96–112)
Creatinine, Ser: 0.81 mg/dL (ref 0.40–1.20)
GFR: 80.12 mL/min (ref >60.00)
Glucose, Bld: 83 mg/dL (ref 70–99)
Potassium: 3.9 meq/L (ref 3.5–5.1)
Sodium: 140 meq/L (ref 135–145)
Total Bilirubin: 0.7 mg/dL (ref 0.2–1.2)
Total Protein: 6.9 g/dL (ref 6.0–8.3)

## 2023-02-08 LAB — POC URINALSYSI DIPSTICK (AUTOMATED)
Bilirubin, UA: NEGATIVE
Blood, UA: NEGATIVE
Glucose, UA: NEGATIVE
Ketones, UA: NEGATIVE
Leukocytes, UA: NEGATIVE
Nitrite, UA: NEGATIVE
Protein, UA: NEGATIVE
Spec Grav, UA: 1.02 (ref 1.010–1.025)
Urobilinogen, UA: 0.2 U/dL
pH, UA: 5 (ref 5.0–8.0)

## 2023-02-08 LAB — CBC WITH DIFFERENTIAL/PLATELET
Basophils Absolute: 0 10*3/uL (ref 0.0–0.1)
Basophils Relative: 0.6 % (ref 0.0–3.0)
Eosinophils Absolute: 0.1 10*3/uL (ref 0.0–0.7)
Eosinophils Relative: 3.1 % (ref 0.0–5.0)
HCT: 41.8 % (ref 36.0–46.0)
Hemoglobin: 13.8 g/dL (ref 12.0–15.0)
Lymphocytes Relative: 26.2 % (ref 12.0–46.0)
Lymphs Abs: 1 10*3/uL (ref 0.7–4.0)
MCHC: 33.1 g/dL (ref 30.0–36.0)
MCV: 86.2 fL (ref 78.0–100.0)
Monocytes Absolute: 0.5 10*3/uL (ref 0.1–1.0)
Monocytes Relative: 12.7 % — ABNORMAL HIGH (ref 3.0–12.0)
Neutro Abs: 2.1 10*3/uL (ref 1.4–7.7)
Neutrophils Relative %: 57.4 % (ref 43.0–77.0)
Platelets: 237 10*3/uL (ref 150.0–400.0)
RBC: 4.85 Mil/uL (ref 3.87–5.11)
RDW: 12.9 % (ref 11.5–15.5)
WBC: 3.7 10*3/uL — ABNORMAL LOW (ref 4.0–10.5)

## 2023-02-08 MED ORDER — METHYLPREDNISOLONE 4 MG PO TABS: ORAL_TABLET | ORAL | 0 refills | Status: DC

## 2023-02-08 NOTE — Progress Notes (Signed)
Subjective:    Patient ID: [name], female    DOB: 25-Dec-1965, 58 y.o.   MRN: [id number]  HPI Discussed the use of AI scribe software for clinical note transcription with the patient, who gave verbal consent to proceed.  History of Present Illness   The patient presents with back pain radiating to the leg.  She has been experiencing back pain for the past three weeks, which has worsened over the last week. The pain initially started in the right lower quadrant and quickly associated with low back pain. c\Currently some rt si area pain radiates down the back of her leg, consistent with sciatica. She describes the pain as sharp, especially when sitting and then standing, and it is exacerbated by movement, such as lifting boxes at work.  She has a history of similar pain in the past, for which she took Aleve and performed stretches, but these have not been effective this time. She attributes the onset of the current episode to helping her daughter move, possibly twisting the wrong way.  She is currently taking Valsartan 320 mg, Hydralazine 25 mg three times a day, and Carvedilol 12.5 mg twice daily for her blood pressure. She last took Aleve yesterday afternoon for pain relief.  Her past medical history includes a hysterectomy, and there is a note in her chart from 2020 indicating a surgically absent appendix, although she is not aware of having had an appendectomy. An MRI of her lumbar spine in 2010 showed very minimal degeneration at L5-S1.  No nausea, vomiting, fever, chills, sweats, or urinary tract symptoms. She reports a normal appetite. She rates her pain as a 5 at rest, increasing to 10 or above with movement.        Review of Systems  Constitutional:  Negative for chills and fatigue.  Respiratory:  Negative for cough, chest tightness, shortness of breath and wheezing.   Cardiovascular:  Negative for chest pain and palpitations.  Gastrointestinal:  Negative for abdominal pain.   Genitourinary:  Negative for dysuria, frequency and pelvic pain.  Musculoskeletal:  Positive for back pain.  Skin:  Negative for rash.  Neurological:  Negative for tremors, facial asymmetry and light-headedness.   Past Medical History:  Diagnosis Date   Acute right-sided low back pain with right-sided sciatica 12/11/2008   Qualifier: Diagnosis of   By: Nena Jordan        Allergy    Anemia    Arthritis    left knee   Headache(784.0)    Chronic migraines--Dr Lewitt   Hypertension    Meckel's diverticulum 08/2004   with ischemia   Myoma 11/2005   right ovary   Renal insufficiency 08/17/2022   Sacroiliac dysfunction 06/23/2012   Uterine adhesions      Social History   Socioeconomic History   Marital status: Single    Spouse name: Not on file   Number of children: 3   Years of education: Not on file   Highest education level: 12th grade  Occupational History   Occupation: Lobbyist: [employer]  Tobacco Use   Smoking status: Never   Smokeless tobacco: Never  Substance and Sexual Activity   Alcohol use: Yes    Comment: social   Drug use: No   Sexual activity: Yes    Partners: Male  Other Topics Concern   Not on file  Social History Narrative   Lives with daughter (grand-daughter)   Daughter- lives in [place]-  lives locally   Works at [employer]   Completed HS   No pets   Enjoys walking, mo   vies, Theme park manager, bowling      Patient is right-handed. Her daughter and granddaughter live with her in a 2 story house. She drinks one cup of coffee a day in the winter months. She drinks tea occasionally. She walks daily, weather permitting.   Social Drivers of Corporate investment banker Strain: Low Risk  (08/10/2022)   Overall Financial Resource Strain (CARDIA)    Difficulty of Paying Living Expenses: Not hard at all  Food Insecurity: No Food Insecurity (08/10/2022)   Hunger Vital Sign    Worried About Running Out of Food in the Last Year: Never true     Ran Out of Food in the Last Year: Never true  Transportation Needs: No Transportation Needs (08/10/2022)   PRAPARE - Administrator, Civil Service (Medical): No    Lack of Transportation (Non-Medical): No  Physical Activity: Insufficiently Active (08/10/2022)   Exercise Vital Sign    Days of Exercise per Week: 3 days    Minutes of Exercise per Session: 30 min  Stress: No Stress Concern Present (08/10/2022)   [employer] of Occupational Health - Occupational Stress Questionnaire    Feeling of Stress : Not at all  Social Connections: Moderately Isolated (08/10/2022)   Social Connection and Isolation Panel [NHANES]    Frequency of Communication with Friends and Family: Three times a week    Frequency of Social Gatherings with Friends and Family: Once a week    Attends Religious Services: 1 to 4 times per year    Active Member of [employer] or Organizations: No    Attends Banker Meetings: Not on file    Marital Status: Widowed  Intimate Partner Violence: Not on file    Past Surgical History:  Procedure Laterality Date   ABDOMINAL HYSTERECTOMY  11/2005   bowel reconstruction     LAPAROSCOPIC SALPINGOOPHERECTOMY  11/2005   right-- assisted with daVinci robot, and lysis of omental adhesions.   LAPAROSCOPY  11/2005   total laparoscopy   OTHER SURGICAL HISTORY     Meckel's diverticulectomy, small bowel resection and resection of retrocecal appendix.    Family History  Problem Relation Age of Onset   Hypertension Mother    Hypertension Father    Ovarian cancer Maternal Grandmother    Diabetes Mellitus I Maternal Grandmother    Hypertension Maternal Grandmother    Hypertension Other    Cancer Neg Hx    Colon cancer Neg Hx    Esophageal cancer Neg Hx    Rectal cancer Neg Hx    Stomach cancer Neg Hx    Pancreatic cancer Neg Hx     No Known Allergies  Current Outpatient Medications on File Prior to Visit  Medication Sig Dispense Refill   carvedilol  (COREG) 12.5 MG tablet Take 1 tablet (12.5 mg total) by mouth 2 (two) times daily with a meal. 180 tablet 1   citalopram (CELEXA) 20 MG tablet Take 1 tablet (20 mg total) by mouth daily. 90 tablet 0   Erenumab-aooe (AIMOVIG) 70 MG/ML SOAJ Inject 70 mg into the skin every 28 (twenty-eight) days. 1 mL 11   hydrALAZINE (APRESOLINE) 25 MG tablet Take 1 tablet (25 mg total) by mouth 3 (three) times daily. 270 tablet 1   loratadine (CLARITIN) 10 MG tablet Take 1 tablet (10 mg total) by mouth daily. 30 tablet 11  Rimegepant Sulfate (NURTEC) 75 MG TBDP Take 1 tablet (75 mg total) by mouth daily as needed. 8 tablet 11   valsartan (DIOVAN) 320 MG tablet Take 1 tablet (320 mg total) by mouth daily. 90 tablet 1   No current facility-administered medications on file prior to visit.    BP (!) 148/80   Pulse 64   Resp 18   Ht 5\' 3"  (1.6 m)   Wt 155 lb (70.3 kg)   LMP 11/10/2005   SpO2 100%   BMI 27.46 kg/m    CT abdomen and pelvis: Surgically absent appendix, no ovaries visualized (2020) MRI lumbar spine: Minimal degeneration at L5-S1, otherwise unremarkable (2010)  UA- clear today.     Objective:   Physical Exam   General Appearance- Not in acute distress.    Chest and Lung Exam Auscultation: Breath sounds:-Normal. Clear even and unlabored. Adventitious sounds:- No Adventitious sounds.  Cardiovascular Auscultation:Rythm - Regular, rate and rythm. Heart Sounds -Normal heart sounds.  Abdomen Inspection:-Inspection Normal.  Palpation/Perucssion: Palpation and Percussion of the abdomen reveal- moderate rt lower quadrant Tender, No Rebound tenderness, No rigidity(Guarding) and No Palpable abdominal masses.  Liver:-Normal.  Spleen:- Normal.   Back Mid lumbar spine tenderness to palpation. Pain on straight leg lift. Pain on lateral movements and flexion/extension of the spine.  Lower ext neurologic  L5-S1 sensation intact bilaterally. Normal patellar reflexes bilaterally. No  foot drop bilaterally.        Assessment & Plan:   Assessment and Plan    Low Back Pain with Sciatica with associate rlq pain. No hernia on exam,  surgical absent appendix and adnexa per 2010 ct abd and pelvis Pain in the right lower quadrant and right SI area with radiation down the back of the leg. Pain worsens with movement and lifting. History of similar pain previously treated by a chiropractor. -Order urinalysis to rule out urinary tract issues. -Prescribe Medrol taper to manage pain and inflammation. -Recommend back exercises. -If pain persists, order lumbar spine x-rays. -If severe and increasing pain, consider CT abdomen and pelvis to rule out other causes such as hernia or kidney stones. -Follow up in 7-10 days or sooner if needed.  Hypertension Blood pressure slightly elevated, possibly due to pain. -Advise patient to monitor blood pressure at home. -If blood pressure remains high, consider increasing hydralazine to 50mg  three times a day. -Continue valsartan 320mg  daily and carvedilol 12.5mg  twice daily.   -Order CBC and metabolic panel.   Follow up 7-10 days or sooner if needed

## 2023-02-08 NOTE — Patient Instructions (Signed)
Low Back Pain with Sciatica with associate rlq pain. No hernia on exam,  surgical absent appendix and adnexa per 2010 ct abd and pelvis Pain in the right lower quadrant and right SI area with radiation down the back of the leg. Pain worsens with movement and lifting. History of similar pain previously treated by a chiropractor. -Order urinalysis to rule out urinary tract issues. -Prescribe Medrol taper to manage pain and inflammation. -Recommend back exercises. -If pain persists, order lumbar spine x-rays. -If severe and increasing pain, consider CT abdomen and pelvis to rule out other causes such as hernia or kidney stones. -Follow up in 7-10 days or sooner if needed.  Hypertension Blood pressure slightly elevated, possibly due to pain. -Advise patient to monitor blood pressure at home. -If blood pressure remains high, consider increasing hydralazine to 50mg  three times a day. -Continue valsartan 320mg  daily and carvedilol 12.5mg  twice daily.   -Order CBC and metabolic panel.  Follow up 7-10 days or sooner if needed

## 2023-02-15 ENCOUNTER — Ambulatory Visit: Payer: 59 | Admitting: Family

## 2023-03-06 ENCOUNTER — Ambulatory Visit: Payer: 59 | Admitting: Neurology

## 2023-03-07 NOTE — Progress Notes (Unsigned)
 NEUROLOGY FOLLOW UP OFFICE NOTE  [name] [id number]  Assessment/Plan:   Migraine without aura, without status migrainosus, not intractable   1.  Migraine prevention:  Aimovig 70mg  Q28d *** 2.  Migraine rescue:  Nurtec 75mg   *** 3.  Limit use of pain relievers to no more than 2 days out of week to prevent risk of rebound or medication-overuse headache. 4.  Keep headache diary 5.  Follow up 1 year ***   Subjective:  [name] is a 58 year old right-handed female with hypertension who follows up for migranes.   UPDATE: Intensity:  mild Duration:  A couple of hours with Nurtec Frequency:  1 a month Current NSAIDS: none Current analgesics: None Current triptans: none Current ergotamine: None Current anti-emetic: None Current muscle relaxants: None Current anti-anxiolytic: None Current sleep aide: None Current Antihypertensive medications: Lisinopril, Bystolic, HCTZ Current Antidepressant medications: None Current Anticonvulsant medications: None Current anti-CGRP: Aimovig 70mg  every 28 days; Nurtec rescue Current Vitamins/Herbal/Supplements: None Current Antihistamines/Decongestants: None Other therapy: None Remote/birth control: None   Caffeine: Rarely coffee or sweet tea Alcohol: No Smoker: No Diet: Hydrates.  No soda Exercise: Walks daily Depression: No; Anxiety: No Other pain: No Sleep hygiene: Good   HISTORY:  Onset: In her mid 14s Location:  Unilateral either side (retro-orbital to back of head, right greater than left) and into neck Quality:  throbbing Initial intensity:  Mild-moderate and severe.  She denies new headache, thunderclap headache or severe headache that wakes from sleep. Aura:  no Prodrome:  no Postdrome:  no Associated symptoms: Conjunctival injection, dizziness, nausea, photophobia, phonophobia, blurred vision.  She denies associated unilateral numbness or weakness. Initial duration:  Mild-moderate:  1/2 day; Severe: 3 days. Often  wakes up with migraine. Initial Frequency:  Mild-moderate:  2 days a month; Severe: 1 every 3 months Initial Frequency of abortive medication: 2 days a month Triggers: Hot dogs Relieving factors: Heating pad, cold pack or hot shower on neck Activity:  Severe aggravates   Past NSAIDS:  ibuprofen, naproxen Past analgesics:  Excedrin Migraine, Tylenol Past abortive triptans:  Sumatriptan tablet; Relpax 40 mg, Zomig NS (not covered by insurance) Past muscle relaxants:  Flexeril, Robaxin Past anti-emetic:  no Past antihypertensive medications:  no Past antidepressant medications:  no Past anticonvulsant medications:  topiramate (not sure why taken off) Past vitamins/Herbal/Supplements:  no Past antihistamines/decongestants:  no Other past therapies:  no   Family history of headache:  no    PAST MEDICAL HISTORY: Past Medical History:  Diagnosis Date   Acute right-sided low back pain with right-sided sciatica 12/11/2008   Qualifier: Diagnosis of   By: Nena Jordan        Allergy    Anemia    Arthritis    left knee   Headache(784.0)    Chronic migraines--Dr Lewitt   Hypertension    Meckel's diverticulum 08/2004   with ischemia   Myoma 11/2005   right ovary   Renal insufficiency 08/17/2022   Sacroiliac dysfunction 06/23/2012   Uterine adhesions     MEDICATIONS: Current Outpatient Medications on File Prior to Visit  Medication Sig Dispense Refill   carvedilol (COREG) 12.5 MG tablet Take 1 tablet (12.5 mg total) by mouth 2 (two) times daily with a meal. 180 tablet 1   citalopram (CELEXA) 20 MG tablet Take 1 tablet (20 mg total) by mouth daily. 90 tablet 0   Erenumab-aooe (AIMOVIG) 70 MG/ML SOAJ Inject 70 mg into the skin every 28 (twenty-eight) days. 1 mL  11   hydrALAZINE (APRESOLINE) 25 MG tablet Take 1 tablet (25 mg total) by mouth 3 (three) times daily. 270 tablet 1   loratadine (CLARITIN) 10 MG tablet Take 1 tablet (10 mg total) by mouth daily. 30 tablet 11    methylPREDNISolone (MEDROL) 4 MG tablet Standard 6 day taper dose 21 tablet 0   Rimegepant Sulfate (NURTEC) 75 MG TBDP Take 1 tablet (75 mg total) by mouth daily as needed. 8 tablet 11   valsartan (DIOVAN) 320 MG tablet Take 1 tablet (320 mg total) by mouth daily. 90 tablet 1   No current facility-administered medications on file prior to visit.    ALLERGIES: No Known Allergies  FAMILY HISTORY: Family History  Problem Relation Age of Onset   Hypertension Mother    Hypertension Father    Ovarian cancer Maternal Grandmother    Diabetes Mellitus I Maternal Grandmother    Hypertension Maternal Grandmother    Hypertension Other    Cancer Neg Hx    Colon cancer Neg Hx    Esophageal cancer Neg Hx    Rectal cancer Neg Hx    Stomach cancer Neg Hx    Pancreatic cancer Neg Hx       Objective:  *** General: No acute distress.  Patient appears well-groomed.   Head:  Normocephalic/atraumatic Neck:  Supple.  No paraspinal tenderness.  Full range of motion. Heart:  Regular rate and rhythm. Neuro:  Alert and oriented.  Speech fluent and not dysarthric.  Language intact.  CN II-XII intact.  Bulk and tone normal.  Muscle strength 5/5 throughout.  Deep tendon reflexes 2+ throughout.  Gait normal.  Romberg negative.    Shon Millet, DO  CC: Sandford Craze, NP

## 2023-03-08 ENCOUNTER — Ambulatory Visit: Payer: 59 | Admitting: Neurology

## 2023-03-08 ENCOUNTER — Encounter: Payer: Self-pay | Admitting: Neurology

## 2023-03-08 VITALS — BP 136/66 | HR 76 | Ht 63.0 in | Wt 155.0 lb

## 2023-03-08 DIAGNOSIS — G43009 Migraine without aura, not intractable, without status migrainosus: Secondary | ICD-10-CM

## 2023-03-08 MED ORDER — AIMOVIG 70 MG/ML ~~LOC~~ SOAJ: 70.0000 mg | SUBCUTANEOUS | 11 refills | Status: AC

## 2023-03-08 MED ORDER — NURTEC 75 MG PO TBDP
75.0000 mg | ORAL_TABLET | Freq: Every day | ORAL | 11 refills | Status: AC | PRN
Start: 2023-03-08 — End: ?

## 2023-06-28 ENCOUNTER — Ambulatory Visit: Payer: 59 | Admitting: Family

## 2023-06-28 VITALS — BP 117/66 | HR 77 | Temp 98.7°F | Resp 16 | Ht 63.0 in | Wt 146.0 lb

## 2023-06-28 DIAGNOSIS — Z1231 Encounter for screening mammogram for malignant neoplasm of breast: Secondary | ICD-10-CM

## 2023-06-28 DIAGNOSIS — R232 Flushing: Secondary | ICD-10-CM | POA: Diagnosis not present

## 2023-06-28 DIAGNOSIS — I10 Essential (primary) hypertension: Secondary | ICD-10-CM

## 2023-06-28 DIAGNOSIS — J302 Other seasonal allergic rhinitis: Secondary | ICD-10-CM | POA: Diagnosis not present

## 2023-06-28 DIAGNOSIS — G43009 Migraine without aura, not intractable, without status migrainosus: Secondary | ICD-10-CM | POA: Diagnosis not present

## 2023-06-28 DIAGNOSIS — E785 Hyperlipidemia, unspecified: Secondary | ICD-10-CM | POA: Diagnosis not present

## 2023-06-28 DIAGNOSIS — R634 Abnormal weight loss: Secondary | ICD-10-CM

## 2023-06-28 LAB — COMPREHENSIVE METABOLIC PANEL WITH GFR
ALT: 10 U/L (ref 0–35)
AST: 12 U/L (ref 0–37)
Albumin: 4.2 g/dL (ref 3.5–5.2)
Alkaline Phosphatase: 68 U/L (ref 39–117)
BUN: 20 mg/dL (ref 6–23)
CO2: 32 meq/L (ref 19–32)
Calcium: 9.3 mg/dL (ref 8.4–10.5)
Chloride: 109 meq/L (ref 96–112)
Creatinine, Ser: 0.87 mg/dL (ref 0.40–1.20)
GFR: 73.34 mL/min (ref >60.00)
Glucose, Bld: 96 mg/dL (ref 70–99)
Potassium: 3.7 meq/L (ref 3.5–5.1)
Sodium: 144 meq/L (ref 135–145)
Total Bilirubin: 0.8 mg/dL (ref 0.2–1.2)
Total Protein: 6.4 g/dL (ref 6.0–8.3)

## 2023-06-28 LAB — LIPID PANEL
Cholesterol: 201 mg/dL — ABNORMAL HIGH (ref 0–200)
HDL: 40.3 mg/dL (ref >39.00)
LDL Cholesterol: 143 mg/dL — ABNORMAL HIGH (ref 0–99)
NonHDL: 161.06
Total CHOL/HDL Ratio: 5
Triglycerides: 89 mg/dL (ref 0.0–149.0)
VLDL: 17.8 mg/dL (ref 0.0–40.0)

## 2023-06-28 NOTE — Patient Instructions (Signed)
 VISIT SUMMARY:  Today, we reviewed your blood pressure management, migraine treatment, allergies, menopausal symptoms, and general health maintenance. Your blood pressure is well-controlled, and you are managing your weight effectively through diet and exercise.  YOUR PLAN:  HYPERTENSION: Your blood pressure is well-controlled with your current medications. -Continue taking carvedilol , hydralazine , and valsartan  as prescribed.  MIGRAINE: You have not experienced recent migraines and are managing them with Nurtec and Aimovig . -Continue using Nurtec as needed. -Continue monthly Aimovig  injections.  ALLERGIC RHINITIS: You have runny nose and itchy eyes, which are relieved by Claritin . -Continue taking Claritin .  MENOPAUSAL SYMPTOMS: Citalopram  is effective for your hot flashes and mood stability. -Continue taking citalopram .  GENERAL HEALTH MAINTENANCE: Your weight has decreased with diet and exercise. Your cholesterol was mildly elevated a year ago, and you are due for a mammogram at the end of July. -Order a cholesterol panel. -Order a mammogram for the end of July. -Continue with a healthy diet and regular exercise.

## 2023-06-28 NOTE — Assessment & Plan Note (Signed)
 BP Readings from Last 3 Encounters:  06/28/23 117/66  03/08/23 136/66  02/08/23 (!) 148/80   At goal, continue valsartan , carvedilol  and hydralazine .

## 2023-06-28 NOTE — Assessment & Plan Note (Signed)
Stable on citalopram. Continue same.  

## 2023-06-28 NOTE — Assessment & Plan Note (Signed)
 Improved on Aimovig  injections- has not needed nurtec recently. Followed by Neuro, Dr. Festus Hubert.

## 2023-06-28 NOTE — Assessment & Plan Note (Signed)
 Lab Results  Component Value Date   CHOL 212 (H) 07/20/2022   HDL 36.00 (L) 07/20/2022   LDLCALC 153 (H) 07/20/2022   TRIG 117.0 07/20/2022   CHOLHDL 6 07/20/2022

## 2023-06-28 NOTE — Progress Notes (Signed)
 Subjective:     Patient ID: [name], female    DOB: Jan 08, 1966, 58 y.o.   MRN: [id number]  Chief Complaint  Patient presents with   Hypertension    Here for follow up    Hypertension    Discussed the use of AI scribe software for clinical note transcription with the patient, who gave verbal consent to proceed.  History of Present Illness  [name] is a 58 year old female with hypertension who presents for a follow-up on her medications and blood pressure management.  Her blood pressure is well-controlled with carvedilol  12.5 mg twice daily, hydralazine  25 mg three times daily, and valsartan  320 mg daily. She has not experienced recent migraines and uses Nurtec as needed, along with monthly Aimovig  injections.  Allergies are bothersome this year, with rhinorrhea and pruritus of the eyes, relieved by Claritin . She takes citalopram  for hot flashes, which has been helpful, and her mood is stable.  She is actively managing her weight by eating fish, reducing red meat intake, drinking plenty of water, and walking at least three times a week for about an hour to an hour and a half.  Previously experienced nocturia has improved by reducing fluid intake before bedtime. Her cholesterol was mildly elevated a year ago, but she is not on any cholesterol medication.     Health Maintenance Due  Topic Date Due   COVID-19 Vaccine (6 - 2024-25 season) 11/26/2022    Past Medical History:  Diagnosis Date   Acute right-sided low back pain with right-sided sciatica 12/11/2008   Qualifier: Diagnosis of   By: Marthe Slain        Allergy    Anemia    Arthritis    left knee   Headache(784.0)    Chronic migraines--Dr Lewitt   Hypertension    Meckel's diverticulum 08/2004   with ischemia   Myoma 11/2005   right ovary   Renal insufficiency 08/17/2022   Sacroiliac dysfunction 06/23/2012   Uterine adhesions     Past Surgical History:  Procedure Laterality Date   ABDOMINAL  HYSTERECTOMY  11/2005   bowel reconstruction     LAPAROSCOPIC SALPINGOOPHERECTOMY  11/2005   right-- assisted with daVinci robot, and lysis of omental adhesions.   LAPAROSCOPY  11/2005   total laparoscopy   OTHER SURGICAL HISTORY     Meckel's diverticulectomy, small bowel resection and resection of retrocecal appendix.    Family History  Problem Relation Age of Onset   Hypertension Mother    Hypertension Father    Ovarian cancer Maternal Grandmother    Diabetes Mellitus I Maternal Grandmother    Hypertension Maternal Grandmother    Hypertension Other    Cancer Neg Hx    Colon cancer Neg Hx    Esophageal cancer Neg Hx    Rectal cancer Neg Hx    Stomach cancer Neg Hx    Pancreatic cancer Neg Hx     Social History   Socioeconomic History   Marital status: Single    Spouse name: Not on file   Number of children: 3   Years of education: Not on file   Highest education level: 12th grade  Occupational History   Occupation: Lobbyist: [employer]  Tobacco Use   Smoking status: Never   Smokeless tobacco: Never  Substance and Sexual Activity   Alcohol use: Yes    Comment: social   Drug use: No   Sexual activity: Yes  Partners: Male  Other Topics Concern   Not on file  Social History Narrative   Lives with daughter (grand-daughter)   Daughter- lives in [place]- lives locally   Works at [employer]   Completed HS   No pets   Enjoys walking, mo   vies, Theme park manager, bowling      Patient is right-handed. Her daughter and granddaughter live with her in a 2 story house. She drinks one cup of coffee a day in the winter months. She drinks tea occasionally. She walks daily, weather permitting.   Social Drivers of Corporate investment banker Strain: Low Risk  (06/27/2023)   Overall Financial Resource Strain (CARDIA)    Difficulty of Paying Living Expenses: Not hard at all  Food Insecurity: No Food Insecurity (06/27/2023)   Hunger Vital Sign    Worried About Running  Out of Food in the Last Year: Never true    Ran Out of Food in the Last Year: Never true  Transportation Needs: No Transportation Needs (06/27/2023)   PRAPARE - Administrator, Civil Service (Medical): No    Lack of Transportation (Non-Medical): No  Physical Activity: Sufficiently Active (06/27/2023)   Exercise Vital Sign    Days of Exercise per Week: 3 days    Minutes of Exercise per Session: 60 min  Stress: No Stress Concern Present (06/27/2023)   [employer] of Occupational Health - Occupational Stress Questionnaire    Feeling of Stress: Not at all  Social Connections: Moderately Isolated (06/27/2023)   Social Connection and Isolation Panel    Frequency of Communication with Friends and Family: More than three times a week    Frequency of Social Gatherings with Friends and Family: Once a week    Attends Religious Services: 1 to 4 times per year    Active Member of [employer] or Organizations: No    Attends Banker Meetings: Not on file    Marital Status: Widowed  Intimate Partner Violence: Not on file    Outpatient Medications Prior to Visit  Medication Sig Dispense Refill   carvedilol  (COREG ) 12.5 MG tablet Take 1 tablet (12.5 mg total) by mouth 2 (two) times daily with a meal. 180 tablet 1   citalopram  (CELEXA ) 20 MG tablet Take 1 tablet (20 mg total) by mouth daily. 90 tablet 0   Erenumab -aooe (AIMOVIG ) 70 MG/ML SOAJ Inject 70 mg into the skin every 28 (twenty-eight) days. 1 mL 11   hydrALAZINE  (APRESOLINE ) 25 MG tablet Take 1 tablet (25 mg total) by mouth 3 (three) times daily. 270 tablet 1   loratadine  (CLARITIN ) 10 MG tablet Take 1 tablet (10 mg total) by mouth daily. 30 tablet 11   Rimegepant Sulfate (NURTEC) 75 MG TBDP Take 1 tablet (75 mg total) by mouth daily as needed. 8 tablet 11   valsartan  (DIOVAN ) 320 MG tablet Take 1 tablet (320 mg total) by mouth daily. 90 tablet 1   No facility-administered medications prior to visit.    No Known  Allergies  ROS See HPI    Objective:    Physical Exam Constitutional:      General: She is not in acute distress.    Appearance: Normal appearance. She is well-developed.  HENT:     Head: Normocephalic and atraumatic.     Right Ear: External ear normal.     Left Ear: External ear normal.   Eyes:     General: No scleral icterus.  Neck:  Thyroid : No thyromegaly.   Cardiovascular:     Rate and Rhythm: Normal rate and regular rhythm.     Heart sounds: Normal heart sounds. No murmur heard. Pulmonary:     Effort: Pulmonary effort is normal. No respiratory distress.     Breath sounds: Normal breath sounds. No wheezing.   Musculoskeletal:     Cervical back: Neck supple.   Skin:    General: Skin is warm and dry.   Neurological:     Mental Status: She is alert and oriented to person, place, and time.   Psychiatric:        Mood and Affect: Mood normal.        Behavior: Behavior normal.        Thought Content: Thought content normal.        Judgment: Judgment normal.      BP 117/66 (BP Location: Right Arm, Patient Position: Sitting, Cuff Size: Small)   Pulse 77   Temp 98.7 F (37.1 C) (Oral)   Resp 16   Ht 5' 3 (1.6 m)   Wt 146 lb (66.2 kg)   LMP 11/10/2005   SpO2 99%   BMI 25.86 kg/m  Wt Readings from Last 3 Encounters:  06/28/23 146 lb (66.2 kg)  03/08/23 155 lb (70.3 kg)  02/08/23 155 lb (70.3 kg)       Assessment & Plan:   Problem List Items Addressed This Visit       Unprioritized   Weight loss   She has been working on weight loss with improved diet, increased water, walking more.       Seasonal allergies   Stable with prn use of claritin . Continue same.       Migraine   Improved on Aimovig  injections- has not needed nurtec recently. Followed by Neuro, Dr. Festus Hubert.       Hyperlipidemia   Lab Results  Component Value Date   CHOL 212 (H) 07/20/2022   HDL 36.00 (L) 07/20/2022   LDLCALC 153 (H) 07/20/2022   TRIG 117.0 07/20/2022    CHOLHDL 6 07/20/2022         Relevant Orders   Lipid panel   Hot flashes   Stable on citalopram .  Continue same.       Essential hypertension - Primary   BP Readings from Last 3 Encounters:  06/28/23 117/66  03/08/23 136/66  02/08/23 (!) 148/80   At goal, continue valsartan , carvedilol  and hydralazine .       Relevant Orders   Comp Met (CMET)   Other Visit Diagnoses       Breast cancer screening by mammogram       Relevant Orders   MM 3D SCREENING MAMMOGRAM BILATERAL BREAST       I am having Pasqual Bone maintain her loratadine , valsartan , citalopram , hydrALAZINE , carvedilol , Nurtec, and Aimovig .  No orders of the defined types were placed in this encounter.

## 2023-06-28 NOTE — Assessment & Plan Note (Signed)
 Stable with prn use of claritin . Continue same.

## 2023-06-28 NOTE — Assessment & Plan Note (Signed)
 She has been working on weight loss with improved diet, increased water, walking more.

## 2023-06-29 ENCOUNTER — Ambulatory Visit: Payer: Self-pay | Admitting: Family

## 2023-07-04 ENCOUNTER — Other Ambulatory Visit: Payer: Self-pay | Admitting: Family

## 2023-08-07 ENCOUNTER — Inpatient Hospital Stay (HOSPITAL_BASED_OUTPATIENT_CLINIC_OR_DEPARTMENT_OTHER): Admission: RE | Admit: 2023-08-07 | Source: Ambulatory Visit

## 2023-08-14 ENCOUNTER — Ambulatory Visit (HOSPITAL_BASED_OUTPATIENT_CLINIC_OR_DEPARTMENT_OTHER)
Admission: RE | Admit: 2023-08-14 | Discharge: 2023-08-14 | Disposition: A | Discharge status: Home / Self Care | Source: Ambulatory Visit | Attending: Family | Admitting: Family

## 2023-08-14 ENCOUNTER — Encounter (HOSPITAL_BASED_OUTPATIENT_CLINIC_OR_DEPARTMENT_OTHER): Payer: Self-pay

## 2023-08-14 DIAGNOSIS — Z1231 Encounter for screening mammogram for malignant neoplasm of breast: Secondary | ICD-10-CM | POA: Insufficient documentation

## 2023-12-11 ENCOUNTER — Ambulatory Visit: Payer: Self-pay

## 2023-12-11 NOTE — Telephone Encounter (Signed)
 Appt scheduled

## 2023-12-11 NOTE — Telephone Encounter (Signed)
 FYI Only or Action Required?: FYI only for provider: calling back to schedule appointment.  Patient was last seen in primary care on 06/28/2023 by Daryl Setter, NP.  Called Nurse Triage reporting Cough.  Symptoms began several days ago.  Interventions attempted: OTC medications: Delysm, Mucinex .  Symptoms are: unchanged.  Triage Disposition: See Physician Within 24 Hours  Patient/caregiver understands and will follow disposition?: Yes Reason for Disposition  [1] Continuous (nonstop) coughing interferes with work or school AND [2] no improvement using cough treatment per Care Advice  Answer Assessment - Initial Assessment Questions Patient denies SOB or any other symptoms. Taking Delsym at first, no relief and started taking Mucinex  yesterday and getting more relief. Attempted to schedule patient appointment for today or tomorrow, she was trying to get in touch with her daughter. Patient requested to call right back to finish scheduling   1. ONSET: When did the cough begin?      Friday  2. SEVERITY: How bad is the cough today?      Worsening, trouble sleeping last night due to cough  3. SPUTUM: Describe the color of your sputum (e.g., none, dry cough; clear, white, yellow, green)     Clear   4. DIFFICULTY BREATHING: Are you having difficulty breathing? If Yes, ask: How bad is it? (e.g., mild, moderate, severe)      Denies  5. FEVER: Do you have a fever? If Yes, ask: What is your temperature, how was it measured, and when did it start?     Denies  6. CARDIAC HISTORY: Do you have any history of heart disease? (e.g., heart attack, congestive heart failure)      Denies  7. LUNG HISTORY: Do you have any history of lung disease?  (e.g., pulmonary embolus, asthma, emphysema)     Denies  8. TRAVEL: Have you traveled out of the country in the last month? (e.g., travel history, exposures)       Denies  Protocols used: Cough - Acute Productive-A-AH  Copied  from CRM #[id number]. Topic: Clinical - Red Word Triage >> Dec 11, 2023  8:04 AM Adelita E wrote: [facility] that prompted transfer to Nurse Triage: Worsening cough going on since this past Friday, patient could not sleep last night as it was getting worse.

## 2023-12-12 ENCOUNTER — Ambulatory Visit: Admitting: Family

## 2023-12-12 VITALS — BP 100/61 | HR 91 | Temp 98.4°F | Resp 16 | Ht 63.0 in | Wt 138.0 lb

## 2023-12-12 DIAGNOSIS — I10 Essential (primary) hypertension: Secondary | ICD-10-CM

## 2023-12-12 DIAGNOSIS — J069 Acute upper respiratory infection, unspecified: Secondary | ICD-10-CM | POA: Diagnosis not present

## 2023-12-12 DIAGNOSIS — R232 Flushing: Secondary | ICD-10-CM

## 2023-12-12 MED ORDER — BENZONATATE 100 MG PO CAPS: 100.0000 mg | ORAL_CAPSULE | Freq: Three times a day (TID) | ORAL | 0 refills | Status: DC | PRN

## 2023-12-12 NOTE — Progress Notes (Signed)
 Subjective:     Patient ID: [name], female    DOB: 03-16-1965, 58 y.o.   MRN: [id number]  Chief Complaint  Patient presents with   Cough    Patient reports cough for about 5 days    Cough    Discussed the use of AI scribe software for clinical note transcription with the patient, who gave verbal consent to proceed.  History of Present Illness [name] is a 58 year old female who presents with a persistent cough.  She has been experiencing a persistent cough that began severely on Sunday, following a period of not feeling well starting Friday. The cough is particularly troublesome at night, preventing her from sleeping as she cannot lay down without coughing. This has led to her missing work on Monday and today due to lack of sleep.  She has nasal congestion, for which she has been taking Mucinex  D12, which she feels has been effective in managing the congestion. No fever, sore throat, or ear pain.  Her current medications include carvedilol , Diovan , and hydralazine  for blood pressure management. She was previously taking citalopram  for hot flashes but has not taken it recently as she feels she has been doing well without it.  She notes that many people at her workplace have also been sick.      Health Maintenance Due  Topic Date Due   Hepatitis B Vaccines 19-59 Average Risk (1 of 3 - 19+ 3-dose series) Never done   Pneumococcal Vaccine: 50+ Years (1 of 1 - PCV) Never done   Influenza Vaccine  08/11/2023   COVID-19 Vaccine (6 - 2025-26 season) 09/11/2023    Past Medical History:  Diagnosis Date   Acute right-sided low back pain with right-sided sciatica 12/11/2008   Qualifier: Diagnosis of   By: Georgian ROSALEA CHARM Lamar        Allergy    Anemia    Arthritis    left knee   Headache(784.0)    Chronic migraines--Dr Lewitt   Hypertension    Meckel's diverticulum 08/2004   with ischemia   Myoma 11/2005   right ovary   Renal insufficiency 08/17/2022   Sacroiliac  dysfunction 06/23/2012   Uterine adhesions     Past Surgical History:  Procedure Laterality Date   ABDOMINAL HYSTERECTOMY  11/2005   bowel reconstruction     LAPAROSCOPIC SALPINGOOPHERECTOMY  11/2005   right-- assisted with daVinci robot, and lysis of omental adhesions.   LAPAROSCOPY  11/2005   total laparoscopy   OTHER SURGICAL HISTORY     Meckel's diverticulectomy, small bowel resection and resection of retrocecal appendix.    Family History  Problem Relation Age of Onset   Hypertension Mother    Hypertension Father    Ovarian cancer Maternal Grandmother    Diabetes Mellitus I Maternal Grandmother    Hypertension Maternal Grandmother    Hypertension Other    Cancer Neg Hx    Colon cancer Neg Hx    Esophageal cancer Neg Hx    Rectal cancer Neg Hx    Stomach cancer Neg Hx    Pancreatic cancer Neg Hx     Social History   Socioeconomic History   Marital status: Single    Spouse name: Not on file   Number of children: 3   Years of education: Not on file   Highest education level: 12th grade  Occupational History   Occupation: Lobbyist: [employer]  Tobacco Use   Smoking status:  Never   Smokeless tobacco: Never  Substance and Sexual Activity   Alcohol use: Yes    Comment: social   Drug use: No   Sexual activity: Yes    Partners: Male  Other Topics Concern   Not on file  Social History Narrative   Lives with daughter (grand-daughter)   Daughter- lives in [place]- lives locally   Works at [employer]   Completed HS   No pets   Enjoys walking, mo   vies, theme park manager, bowling      Patient is right-handed. Her daughter and granddaughter live with her in a 2 story house. She drinks one cup of coffee a day in the winter months. She drinks tea occasionally. She walks daily, weather permitting.   Social Drivers of Corporate Investment Banker Strain: Low Risk  (12/12/2023)   Overall Financial Resource Strain (CARDIA)    Difficulty of Paying Living Expenses:  Not very hard  Food Insecurity: No Food Insecurity (12/12/2023)   Hunger Vital Sign    Worried About Running Out of Food in the Last Year: Never true    Ran Out of Food in the Last Year: Never true  Transportation Needs: No Transportation Needs (12/12/2023)   PRAPARE - Administrator, Civil Service (Medical): No    Lack of Transportation (Non-Medical): No  Physical Activity: Sufficiently Active (12/12/2023)   Exercise Vital Sign    Days of Exercise per Week: 4 days    Minutes of Exercise per Session: 40 min  Stress: No Stress Concern Present (12/12/2023)   [employer] of Occupational Health - Occupational Stress Questionnaire    Feeling of Stress: Not at all  Social Connections: Moderately Isolated (12/12/2023)   Social Connection and Isolation Panel    Frequency of Communication with Friends and Family: More than three times a week    Frequency of Social Gatherings with Friends and Family: Once a week    Attends Religious Services: More than 4 times per year    Active Member of [employer] or Organizations: No    Attends Banker Meetings: Not on file    Marital Status: Widowed  Catering Manager Violence: Not on file    Outpatient Medications Prior to Visit  Medication Sig Dispense Refill   carvedilol  (COREG ) 12.5 MG tablet Take 1 tablet (12.5 mg total) by mouth 2 (two) times daily with a meal. 180 tablet 1   Erenumab -aooe (AIMOVIG ) 70 MG/ML SOAJ Inject 70 mg into the skin every 28 (twenty-eight) days. 1 mL 11   hydrALAZINE  (APRESOLINE ) 25 MG tablet Take 1 tablet (25 mg total) by mouth 3 (three) times daily. 270 tablet 1   loratadine  (CLARITIN ) 10 MG tablet Take 1 tablet (10 mg total) by mouth daily. 30 tablet 11   Rimegepant Sulfate (NURTEC) 75 MG TBDP Take 1 tablet (75 mg total) by mouth daily as needed. 8 tablet 11   valsartan  (DIOVAN ) 320 MG tablet Take 1 tablet (320 mg total) by mouth daily. 90 tablet 1   citalopram  (CELEXA ) 20 MG tablet Take 1 tablet (20 mg  total) by mouth daily. 90 tablet 0   No facility-administered medications prior to visit.    No Known Allergies  Review of Systems  Respiratory:  Positive for cough.    See HPI    Objective:    Physical Exam Constitutional:      General: She is not in acute distress.    Appearance: Normal appearance. She is well-developed.  HENT:     Head: Normocephalic and atraumatic.     Right Ear: Tympanic membrane, ear canal and external ear normal.     Left Ear: Tympanic membrane, ear canal and external ear normal.     Mouth/Throat:     Pharynx: No pharyngeal swelling or posterior oropharyngeal erythema.  Eyes:     General: No scleral icterus. Neck:     Thyroid : No thyromegaly.  Cardiovascular:     Rate and Rhythm: Normal rate and regular rhythm.     Heart sounds: Normal heart sounds. No murmur heard. Pulmonary:     Effort: Pulmonary effort is normal. No respiratory distress.     Breath sounds: Normal breath sounds. No wheezing.  Musculoskeletal:     Cervical back: Neck supple.  Skin:    General: Skin is warm and dry.  Neurological:     Mental Status: She is alert and oriented to person, place, and time.  Psychiatric:        Mood and Affect: Mood normal.        Behavior: Behavior normal.        Thought Content: Thought content normal.        Judgment: Judgment normal.      BP 100/61 (BP Location: Right Arm, Patient Position: Sitting, Cuff Size: Small)   Pulse 91   Temp 98.4 F (36.9 C) (Oral)   Resp 16   Ht 5' 3 (1.6 m)   Wt 138 lb (62.6 kg)   LMP 11/10/2005   SpO2 100%   BMI 24.45 kg/m  Wt Readings from Last 3 Encounters:  12/12/23 138 lb (62.6 kg)  06/28/23 146 lb (66.2 kg)  03/08/23 155 lb (70.3 kg)       Assessment & Plan:   Problem List Items Addressed This Visit       Unprioritized   Hot flashes   Doing well without citalopram .  Remain off.       Essential hypertension    Blood pressure well-controlled on carvedilol , valsartan , and  hydralazine . - Continue current antihypertensive regimen.      Other Visit Diagnoses       Viral URI with cough    -  Primary   Relevant Medications   benzonatate  (TESSALON ) 100 MG capsule      Assessment & Plan  Acute cough for five days, likely viral. No fever, sore throat, or ear pain. Managed with Mucinex . No wheezing. COVID-19 considered but testing not pursued. Expected improvement in 3-4 days, possible lingering cough. - Prescribed Tessalon  Perles for cough. - Advised to report worsening symptoms or new symptoms like fever.     I have discontinued Annissa E. Mires's citalopram . I am also having her start on benzonatate . Additionally, I am having her maintain her loratadine , hydrALAZINE , carvedilol , Nurtec, Aimovig , and valsartan .  Meds ordered this encounter  Medications   benzonatate  (TESSALON ) 100 MG capsule    Sig: Take 1 capsule (100 mg total) by mouth 3 (three) times daily as needed.    Dispense:  20 capsule    Refill:  0    Supervising Provider:   DOMENICA BLACKBIRD A [[id number]]

## 2023-12-12 NOTE — Patient Instructions (Signed)
  VISIT SUMMARY: You came in today because of a persistent cough that has been troubling you, especially at night, and has caused you to miss work. You also have nasal congestion but no fever, sore throat, or ear pain. Your blood pressure is well-controlled with your current medications.  YOUR PLAN: -ACUTE UPPER RESPIRATORY INFECTION WITH COUGH: You have an acute upper respiratory infection, which is likely caused by a virus. This type of infection usually improves within a few days, although the cough may linger. You have been prescribed Tessalon  Perles to help manage your cough. Please report any worsening symptoms or new symptoms like fever.  -ESSENTIAL HYPERTENSION: Your blood pressure is well-controlled with your current medications (carvedilol , valsartan , and hydralazine ). Continue taking these medications as prescribed.  INSTRUCTIONS: Please follow up if your symptoms worsen or if you develop new symptoms such as a fever. Continue taking your blood pressure medications as prescribed.

## 2023-12-12 NOTE — Assessment & Plan Note (Signed)
 Doing well without citalopram .  Remain off.

## 2023-12-12 NOTE — Assessment & Plan Note (Signed)
  Blood pressure well-controlled on carvedilol , valsartan , and hydralazine . - Continue current antihypertensive regimen.

## 2023-12-20 ENCOUNTER — Encounter: Payer: Self-pay | Admitting: Family

## 2023-12-20 ENCOUNTER — Ambulatory Visit: Admitting: Family

## 2023-12-20 VITALS — BP 122/71 | HR 77 | Temp 98.2°F | Resp 16 | Ht 63.0 in | Wt 144.8 lb

## 2023-12-20 DIAGNOSIS — G43009 Migraine without aura, not intractable, without status migrainosus: Secondary | ICD-10-CM

## 2023-12-20 DIAGNOSIS — I10 Essential (primary) hypertension: Secondary | ICD-10-CM

## 2023-12-20 DIAGNOSIS — Z0000 Encounter for general adult medical examination without abnormal findings: Secondary | ICD-10-CM

## 2023-12-20 DIAGNOSIS — E785 Hyperlipidemia, unspecified: Secondary | ICD-10-CM

## 2023-12-20 DIAGNOSIS — J302 Other seasonal allergic rhinitis: Secondary | ICD-10-CM

## 2023-12-20 DIAGNOSIS — Z23 Encounter for immunization: Secondary | ICD-10-CM

## 2023-12-20 LAB — COMPREHENSIVE METABOLIC PANEL WITH GFR
ALT: 15 U/L (ref 0–35)
AST: 17 U/L (ref 0–37)
Albumin: 3.9 g/dL (ref 3.5–5.2)
Alkaline Phosphatase: 63 U/L (ref 39–117)
BUN: 18 mg/dL (ref 6–23)
CO2: 30 meq/L (ref 19–32)
Calcium: 9.2 mg/dL (ref 8.4–10.5)
Chloride: 109 meq/L (ref 96–112)
Creatinine, Ser: 0.7 mg/dL (ref 0.40–1.20)
GFR: 94.88 mL/min (ref >60.00)
Glucose, Bld: 84 mg/dL (ref 70–99)
Potassium: 3.6 meq/L (ref 3.5–5.1)
Sodium: 145 meq/L (ref 135–145)
Total Bilirubin: 0.6 mg/dL (ref 0.2–1.2)
Total Protein: 6 g/dL (ref 6.0–8.3)

## 2023-12-20 MED ORDER — VALSARTAN 320 MG PO TABS: 320.0000 mg | ORAL_TABLET | Freq: Every day | ORAL | 1 refills | Status: AC

## 2023-12-20 MED ORDER — HYDRALAZINE HCL 25 MG PO TABS: 25.0000 mg | ORAL_TABLET | Freq: Three times a day (TID) | ORAL | 1 refills | Status: AC

## 2023-12-20 MED ORDER — CARVEDILOL 12.5 MG PO TABS: 12.5000 mg | ORAL_TABLET | Freq: Two times a day (BID) | ORAL | 1 refills | Status: AC

## 2023-12-20 NOTE — Assessment & Plan Note (Signed)
 BP Readings from Last 3 Encounters:  12/20/23 122/71  12/12/23 100/61  06/28/23 117/66   Stable on diovan , carvedilol  and hydralazine , continue same.

## 2023-12-20 NOTE — Assessment & Plan Note (Signed)
 Lab Results  Component Value Date   CHOL 201 (H) 06/28/2023   HDL 40.30 06/28/2023   LDLCALC 143 (H) 06/28/2023   TRIG 89.0 06/28/2023   CHOLHDL 5 06/28/2023   Continues healthy diet/exercise.

## 2023-12-20 NOTE — Progress Notes (Signed)
 Subjective:     Patient ID: [name], female    DOB: [birth year], 58 y.o.   MRN: [id number]  Chief Complaint  Patient presents with   Annual Exam    HPI  Discussed the use of AI scribe software for clinical note transcription with the patient, who gave verbal consent to proceed.  History of Present Illness [name] is a 58 year old female who presents for an annual physical exam.  She maintains a healthy lifestyle with a balanced diet and regular exercise, primarily walking daily. She lives alone, having previously lived with her daughter and granddaughter. She occasionally consumes alcohol and denies any use of drugs, tobacco, or vaping.  Her last colonoscopy was in 2017, and she is up to date with her mammogram, which was normal in August. She had a hysterectomy, so Pap smears are not required. Vision and dental check-ups are current.  She experienced a cough last week but has no current cold symptoms. No skin concerns, hearing or vision issues, leg swelling, constipation, diarrhea, urinary concerns, unusual muscle or joint pain, and depression or anxiety. She reports frequent headaches but states they are under control.  Her blood pressure is managed with carvedilol  12.5 mg twice a day, hydralazine  25 mg three times a day, and Diovan  320 mg. She reports improvement in hot flashes and no ongoing issues with reflux or heartburn. Her cholesterol was mildly elevated at two O one six months ago. She manages migraines with Aimovig  injections every 28 days and Nurtec as needed. She reports good control of her allergies and has not needed Claritin  recently.  Immunizations: Heplisav B #1 and prevnar 20 today Diet: Healthy Exercise: enjoys walking most days Colonoscopy: due 8/27 Pap Smear: s/p hysterectomy Mammogram: 08/16/23 normal Vision: up to date Dental: up to date     Health Maintenance Due  Topic Date Due   Hepatitis B Vaccines 19-59 Average Risk (1 of 3 - 19+ 3-dose  series) Never done   Pneumococcal Vaccine: 50+ Years (1 of 1 - PCV) Never done    Past Medical History:  Diagnosis Date   Acute right-sided low back pain with right-sided sciatica 12/11/2008   Qualifier: Diagnosis of   By: Georgian ROSALEA CHARM Lamar        Allergy    Anemia    Arthritis    left knee   Headache(784.0)    Chronic migraines--Dr Lewitt   Hypertension    Meckel's diverticulum 08/2004   with ischemia   Myoma 11/2005   right ovary   Renal insufficiency 08/17/2022   Sacroiliac dysfunction 06/23/2012   Uterine adhesions     Past Surgical History:  Procedure Laterality Date   ABDOMINAL HYSTERECTOMY  11/2005   bowel reconstruction     LAPAROSCOPIC SALPINGOOPHERECTOMY  11/2005   right-- assisted with daVinci robot, and lysis of omental adhesions.   LAPAROSCOPY  11/2005   total laparoscopy   OTHER SURGICAL HISTORY     Meckel's diverticulectomy, small bowel resection and resection of retrocecal appendix.    Family History  Problem Relation Age of Onset   Hypertension Mother    Hypertension Father    Ovarian cancer Maternal Grandmother    Diabetes Mellitus I Maternal Grandmother    Hypertension Maternal Grandmother    Hypertension Other    Cancer Neg Hx    Colon cancer Neg Hx    Esophageal cancer Neg Hx    Rectal cancer Neg Hx    Stomach cancer Neg Hx  Pancreatic cancer Neg Hx     Social History   Socioeconomic History   Marital status: Single    Spouse name: Not on file   Number of children: 3   Years of education: Not on file   Highest education level: 12th grade  Occupational History   Occupation: Lobbyist: [employer]  Tobacco Use   Smoking status: Never   Smokeless tobacco: Never  Substance and Sexual Activity   Alcohol use: Yes    Comment: social   Drug use: No   Sexual activity: Yes    Partners: Male  Other Topics Concern   Not on file  Social History Narrative   Lives alone   Daughter- lives in [place]- lives locally   Works  at [employer]   Completed HS   No pets   Enjoys walking, mo   vies, theme park manager, bowling      Patient is right-handed. Her daughter and granddaughter live with her in a 2 story house. She drinks one cup of coffee a day in the winter months. She drinks tea occasionally. She walks daily, weather permitting.   Social Drivers of Corporate Investment Banker Strain: Low Risk  (12/12/2023)   Overall Financial Resource Strain (CARDIA)    Difficulty of Paying Living Expenses: Not very hard  Food Insecurity: No Food Insecurity (12/12/2023)   Hunger Vital Sign    Worried About Running Out of Food in the Last Year: Never true    Ran Out of Food in the Last Year: Never true  Transportation Needs: No Transportation Needs (12/12/2023)   PRAPARE - Administrator, Civil Service (Medical): No    Lack of Transportation (Non-Medical): No  Physical Activity: Sufficiently Active (12/12/2023)   Exercise Vital Sign    Days of Exercise per Week: 4 days    Minutes of Exercise per Session: 40 min  Stress: No Stress Concern Present (12/12/2023)   [employer] of Occupational Health - Occupational Stress Questionnaire    Feeling of Stress: Not at all  Social Connections: Moderately Isolated (12/12/2023)   Social Connection and Isolation Panel    Frequency of Communication with Friends and Family: More than three times a week    Frequency of Social Gatherings with Friends and Family: Once a week    Attends Religious Services: More than 4 times per year    Active Member of [employer] or Organizations: No    Attends Banker Meetings: Not on file    Marital Status: Widowed  Intimate Partner Violence: Not on file    Outpatient Medications Prior to Visit  Medication Sig Dispense Refill   Erenumab -aooe (AIMOVIG ) 70 MG/ML SOAJ Inject 70 mg into the skin every 28 (twenty-eight) days. 1 mL 11   loratadine  (CLARITIN ) 10 MG tablet Take 1 tablet (10 mg total) by mouth daily. 30 tablet 11   Rimegepant  Sulfate (NURTEC) 75 MG TBDP Take 1 tablet (75 mg total) by mouth daily as needed. 8 tablet 11   benzonatate  (TESSALON ) 100 MG capsule Take 1 capsule (100 mg total) by mouth 3 (three) times daily as needed. 20 capsule 0   carvedilol  (COREG ) 12.5 MG tablet Take 1 tablet (12.5 mg total) by mouth 2 (two) times daily with a meal. 180 tablet 1   hydrALAZINE  (APRESOLINE ) 25 MG tablet Take 1 tablet (25 mg total) by mouth 3 (three) times daily. 270 tablet 1   valsartan  (DIOVAN ) 320 MG tablet Take 1 tablet (  320 mg total) by mouth daily. 90 tablet 1   No facility-administered medications prior to visit.    No Known Allergies  Review of Systems  Constitutional:  Negative for weight loss.  HENT:  Negative for congestion and hearing loss.   Eyes:  Negative for blurred vision.  Respiratory:  Negative for cough.   Cardiovascular:  Negative for leg swelling.  Gastrointestinal:  Negative for constipation and diarrhea.  Genitourinary:  Negative for dysuria and frequency.  Musculoskeletal:  Negative for joint pain and myalgias.  Skin:  Negative for rash.  Neurological:  Negative for headaches.  Psychiatric/Behavioral:  Negative for depression. The patient is not nervous/anxious.        Objective:    Physical Exam   BP 122/71 (BP Location: Right Arm, Patient Position: Sitting, Cuff Size: Normal)   Pulse 77   Temp 98.2 F (36.8 C) (Oral)   Resp 16   Ht 5' 3 (1.6 m)   Wt 144 lb 12.8 oz (65.7 kg)   LMP 11/10/2005   SpO2 100%   BMI 25.65 kg/m  Wt Readings from Last 3 Encounters:  12/20/23 144 lb 12.8 oz (65.7 kg)  12/12/23 138 lb (62.6 kg)  06/28/23 146 lb (66.2 kg)   Physical Exam  Constitutional: She is oriented to person, place, and time. She appears well-developed and well-nourished. No distress.  HENT:  Head: Normocephalic and atraumatic.  Right Ear: Tympanic membrane and ear canal normal.  Left Ear: Tympanic membrane and ear canal normal.  Mouth/Throat: Oropharynx is clear and  moist.  Eyes: Pupils are equal, round, and reactive to light. No scleral icterus.  Neck: Normal range of motion. No thyromegaly present.  Cardiovascular: Normal rate and regular rhythm.   No murmur heard. Pulmonary/Chest: Effort normal and breath sounds normal. No respiratory distress. He has no wheezes. She has no rales. She exhibits no tenderness.  Abdominal: Soft. Bowel sounds are normal. She exhibits no distension and no mass. There is no tenderness. There is no rebound and no guarding.  Musculoskeletal: She exhibits no edema.  Lymphadenopathy:    She has no cervical adenopathy.  Neurological: She is alert and oriented to person, place, and time. She has normal patellar reflexes. She exhibits normal muscle tone. Coordination normal.  Skin: Skin is warm and dry.  Psychiatric: She has a normal mood and affect. Her behavior is normal. Judgment and thought content normal.  Breast/Pelvic:  deferred            Assessment & Plan:       Assessment & Plan:   Problem List Items Addressed This Visit       Unprioritized   Seasonal allergies   Not currently needing claritin . Stable.       Preventative health care - Primary   Adult Wellness Visit Routine wellness visit with no significant changes. Blood pressure well-controlled. Cholesterol mildly elevated six months ago. Mammogram normal. Colonoscopy up to date.  - Prevnar 20 today - Administered first dose of hepatitis B vaccine series. - Ordered kidney function test. - Continue healthy diet and exercise regimen.      Migraine   Stable on Aimovig , prn nurtec following with neurology- Dr. Skeet.      Relevant Medications   carvedilol  (COREG ) 12.5 MG tablet   hydrALAZINE  (APRESOLINE ) 25 MG tablet   valsartan  (DIOVAN ) 320 MG tablet   Hyperlipidemia   Lab Results  Component Value Date   CHOL 201 (H) 06/28/2023   HDL 40.30 06/28/2023   LDLCALC  143 (H) 06/28/2023   TRIG 89.0 06/28/2023   CHOLHDL 5 06/28/2023    Continues healthy diet/exercise.      Relevant Medications   carvedilol  (COREG ) 12.5 MG tablet   hydrALAZINE  (APRESOLINE ) 25 MG tablet   valsartan  (DIOVAN ) 320 MG tablet   Essential hypertension   BP Readings from Last 3 Encounters:  12/20/23 122/71  12/12/23 100/61  06/28/23 117/66   Stable on diovan , carvedilol  and hydralazine , continue same.       Relevant Medications   carvedilol  (COREG ) 12.5 MG tablet   hydrALAZINE  (APRESOLINE ) 25 MG tablet   valsartan  (DIOVAN ) 320 MG tablet   Other Relevant Orders   Comp Met (CMET)   Assessment & Plan    I have discontinued Micole E. Norling's benzonatate . I am also having her maintain her loratadine , Nurtec, Aimovig , carvedilol , hydrALAZINE , and valsartan .  Meds ordered this encounter  Medications   carvedilol  (COREG ) 12.5 MG tablet    Sig: Take 1 tablet (12.5 mg total) by mouth 2 (two) times daily with a meal.    Dispense:  180 tablet    Refill:  1    Supervising Provider:   DOMENICA BLACKBIRD A [[id number]]   hydrALAZINE  (APRESOLINE ) 25 MG tablet    Sig: Take 1 tablet (25 mg total) by mouth 3 (three) times daily.    Dispense:  270 tablet    Refill:  1    Supervising Provider:   DOMENICA BLACKBIRD A [[id number]]   valsartan  (DIOVAN ) 320 MG tablet    Sig: Take 1 tablet (320 mg total) by mouth daily.    Dispense:  90 tablet    Refill:  1    Supervising Provider:   DOMENICA BLACKBIRD A [[id number]]

## 2023-12-20 NOTE — Assessment & Plan Note (Signed)
 Stable on Aimovig , prn nurtec following with neurology- Dr. Skeet.

## 2023-12-20 NOTE — Patient Instructions (Signed)
°  VISIT SUMMARY: Today, you had your annual physical exam. You are maintaining a healthy lifestyle with a balanced diet and regular exercise. Your blood pressure is well-controlled with your current medications, and your cholesterol was mildly elevated six months ago. Your mammogram and colonoscopy are up to date, and you have no significant changes in your health. We discussed the pneumonia vaccine, administered the first dose of the hepatitis B vaccine series, and ordered a kidney function test. You reported a recent cough from a cold, but no other significant symptoms.  YOUR PLAN: -ADULT WELLNESS VISIT: This visit was a routine check-up to monitor your overall health. Your blood pressure is well-controlled, and your cholesterol was mildly elevated six months ago. Your mammogram and colonoscopy are up to date. We discussed the pneumonia vaccine as an option, administered the first dose of the hepatitis B vaccine series, and ordered a kidney function test. Continue with your healthy diet and exercise regimen.  -ESSENTIAL HYPERTENSION: Hypertension means high blood pressure. Your blood pressure is well-controlled with your current medications. We have refilled your prescriptions for Carvedilol , Hydralazine , and Valsartan .  -HYPERLIPIDEMIA: Hyperlipidemia means you have high cholesterol levels. Your cholesterol was mildly elevated six months ago, but there is no need for a repeat test today. Continue with your current diet and exercise regimen.  -MIGRAINE WITHOUT AURA: This type of migraine is a headache that usually affects one side of the head and does not have sensory disturbances. Your migraines are well-controlled with Aimovig  and Nurtec. Continue using Aimovig  injections every 28 days and Nurtec as needed.  -SEASONAL ALLERGIC RHINITIS: This condition is commonly known as hay fever and is caused by allergies. Your allergies are well-controlled without the need for Claritin . Continue with your current  management plan.  INSTRUCTIONS: Please follow up for your kidney function test as ordered. Continue taking your medications as prescribed and maintain your healthy lifestyle. If you experience any new symptoms or have concerns, schedule an appointment.

## 2023-12-20 NOTE — Assessment & Plan Note (Signed)
 Not currently needing claritin . Stable.

## 2023-12-20 NOTE — Assessment & Plan Note (Signed)
 Adult Wellness Visit Routine wellness visit with no significant changes. Blood pressure well-controlled. Cholesterol mildly elevated six months ago. Mammogram normal. Colonoscopy up to date.  - Prevnar 20 today - Administered first dose of hepatitis B vaccine series. - Ordered kidney function test. - Continue healthy diet and exercise regimen.

## 2023-12-21 ENCOUNTER — Ambulatory Visit: Payer: Self-pay | Admitting: Family

## 2024-01-24 ENCOUNTER — Ambulatory Visit (INDEPENDENT_AMBULATORY_CARE_PROVIDER_SITE_OTHER)

## 2024-01-24 DIAGNOSIS — Z23 Encounter for immunization: Secondary | ICD-10-CM | POA: Diagnosis not present

## 2024-01-24 NOTE — Progress Notes (Signed)
 Pt here for second Hep B vaccine per pcp orders. Vaccine given in left deltoid.  Pt tolerated vaccine well. Pt given vaccine info sheet.

## 2024-03-07 ENCOUNTER — Ambulatory Visit: Payer: 59 | Admitting: Neurology

## 2024-06-19 ENCOUNTER — Ambulatory Visit: Admitting: Family
